# Patient Record
Sex: Male | Born: 1975
Health system: Southern US, Community
[De-identification: ages and names within clinical notes are randomized; demographics above are authoritative.]

## PROBLEM LIST (undated history)

## (undated) DIAGNOSIS — G709 Myoneural disorder, unspecified: Secondary | ICD-10-CM

## (undated) DIAGNOSIS — B019 Varicella without complication: Secondary | ICD-10-CM

## (undated) DIAGNOSIS — G4733 Obstructive sleep apnea (adult) (pediatric): Secondary | ICD-10-CM

## (undated) DIAGNOSIS — G4730 Sleep apnea, unspecified: Secondary | ICD-10-CM

## (undated) DIAGNOSIS — R51 Headache: Secondary | ICD-10-CM

## (undated) DIAGNOSIS — G8929 Other chronic pain: Secondary | ICD-10-CM

## (undated) DIAGNOSIS — J45909 Unspecified asthma, uncomplicated: Secondary | ICD-10-CM

## (undated) DIAGNOSIS — E119 Type 2 diabetes mellitus without complications: Secondary | ICD-10-CM

## (undated) DIAGNOSIS — I10 Essential (primary) hypertension: Secondary | ICD-10-CM

## (undated) DIAGNOSIS — R519 Headache, unspecified: Secondary | ICD-10-CM

## (undated) HISTORY — DX: Essential (primary) hypertension: I10

## (undated) HISTORY — DX: Unspecified asthma, uncomplicated: J45.909

## (undated) HISTORY — DX: Headache, unspecified: R51.9

## (undated) HISTORY — DX: Obstructive sleep apnea (adult) (pediatric): G47.33

## (undated) HISTORY — DX: Varicella without complication: B01.9

## (undated) HISTORY — PX: OTHER SURGICAL HISTORY: SHX169

## (undated) HISTORY — DX: Other chronic pain: G89.29

## (undated) HISTORY — DX: Headache: R51

## (undated) HISTORY — DX: Sleep apnea, unspecified: G47.30

## (undated) HISTORY — PX: NO PAST SURGERIES: SHX2092

---

## 2011-09-25 DIAGNOSIS — I10 Essential (primary) hypertension: Secondary | ICD-10-CM

## 2011-09-25 HISTORY — DX: Essential (primary) hypertension: I10

## 2012-01-03 ENCOUNTER — Emergency Department: Payer: Self-pay | Admitting: Emergency Medicine

## 2012-10-28 ENCOUNTER — Encounter: Payer: Self-pay | Admitting: Adult Health

## 2012-10-28 ENCOUNTER — Ambulatory Visit (INDEPENDENT_AMBULATORY_CARE_PROVIDER_SITE_OTHER): Payer: 59 | Admitting: Adult Health

## 2012-10-28 ENCOUNTER — Ambulatory Visit (INDEPENDENT_AMBULATORY_CARE_PROVIDER_SITE_OTHER)
Admission: RE | Admit: 2012-10-28 | Discharge: 2012-10-28 | Disposition: A | Discharge status: Home / Self Care | Payer: 59 | Source: Ambulatory Visit | Attending: Adult Health | Admitting: Adult Health

## 2012-10-28 VITALS — BP 120/78 | HR 61 | Temp 98.5°F | Resp 14 | Ht 75.0 in | Wt 277.5 lb

## 2012-10-28 DIAGNOSIS — R51 Headache: Secondary | ICD-10-CM

## 2012-10-28 DIAGNOSIS — Z5181 Encounter for therapeutic drug level monitoring: Secondary | ICD-10-CM

## 2012-10-28 DIAGNOSIS — I10 Essential (primary) hypertension: Secondary | ICD-10-CM | POA: Insufficient documentation

## 2012-10-28 DIAGNOSIS — Z9989 Dependence on other enabling machines and devices: Secondary | ICD-10-CM | POA: Insufficient documentation

## 2012-10-28 DIAGNOSIS — R52 Pain, unspecified: Secondary | ICD-10-CM

## 2012-10-28 DIAGNOSIS — Z23 Encounter for immunization: Secondary | ICD-10-CM

## 2012-10-28 DIAGNOSIS — G4730 Sleep apnea, unspecified: Secondary | ICD-10-CM

## 2012-10-28 DIAGNOSIS — Z1211 Encounter for screening for malignant neoplasm of colon: Secondary | ICD-10-CM | POA: Insufficient documentation

## 2012-10-28 DIAGNOSIS — Z0000 Encounter for general adult medical examination without abnormal findings: Secondary | ICD-10-CM

## 2012-10-28 LAB — BASIC METABOLIC PANEL
GFR: 80.64 mL/min (ref >60.00)
Glucose, Bld: 89 mg/dL (ref 70–99)
Potassium: 4.3 mEq/L (ref 3.5–5.1)
Sodium: 139 mEq/L (ref 135–145)

## 2012-10-28 NOTE — Assessment & Plan Note (Signed)
Normal physical exam except for those addressed below. Will request medical records from previous provider. Patient reports having recent labs done. Will hold off on ordering further labs until I receive medical records. Will check bmet today since patient was started on lisinopril in October, 2013.

## 2012-10-28 NOTE — Assessment & Plan Note (Signed)
Pain in bilateral heels s/p injury after having to jump off his tractor trailer. Hx of fracture to left ankle. Will send him to [facility] office for bilateral ankle/foot xray. Also pt has pain right lateral elbow. Full ROM. No locking or grinding. Suspect overuse r/t work. Inflammation. Apply ice to the area. Brace for support while working. NSAIDs for short term. If continued discomfort, discussed referral to ortho for possible joint injection.

## 2012-10-28 NOTE — Progress Notes (Signed)
Subjective:    Patient ID: Adam [name], male    DOB: 20-Feb-1976, 37 y.o.   MRN: [id number]  HPI  Adam [name] is a very pleasant 37 y/o male who presents to clinic today to establish care. He has a hx of asthma, HTN and headaches. He was previously followed at [facility]. He reports the following:  1) HTN - recently diagnosed and started on lisinopril. He has been doing well on this medication.   2) Frequent HA - he reports he usually wakes up with a HA in the occipital region. On occasion, his HA is behind bilateral eyes. Denies any n/v or other associated symptoms. He usually does not take any medication for this.  3) Left ankle fracture - hx of injury with fx to left ankle which occasionally causes discomfort. 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 He recently jumped off his truck to avoid being hit by one of the vehicles which had come loose. The results was that he landed on his heels and now is experiencing some increased discomfort. He is ambulating without any problems.  4) Right elbow locking and pain - right hand dominant. Overuse related to his work. He reports pain often wakes him up at night. Does not take any medications for this.   Health Maintenance  Influenza immunization - he does not wish to get the flu shot.  Tdap - We will administer this to him today.   Family History  Problem Relation Age of Onset  . Arthritis Mother   . Hypertension Mother   . Hypertension Father   . Diabetes Father   . Diabetes Brother   . Hypertension Brother   . Diabetes Maternal Grandmother   . Heart disease Maternal Grandmother   . Diabetes Maternal Grandfather   . Kidney disease Maternal Grandfather   . Heart disease Sister   . Diabetes Sister   . Lupus Sister   . Asthma Sister    History   Social History  . Marital Status: Married    Spouse Name: Misty Stanley    Number of Children: 3  . Years of Education: 12   Occupational History  . Psychologist, sport and exercise     Owns  [employer]   Social History Main Topics  . Smoking status: Never Smoker   . Smokeless tobacco: Never Used  . Alcohol Use: No  . Drug Use: No  . Sexually Active: Yes   Other Topics Concern  . Not on file   Social History Narrative  . No narrative on file    Review of Systems  Constitutional: Negative.   Eyes: Negative.        Recent eye exam. No vision problems.  Respiratory: Negative.        Hx of asthma as a child. None as an adult  Cardiovascular: Negative.        Hx of HTN.  Gastrointestinal: Negative.   Genitourinary: Negative.   Musculoskeletal: Negative for back pain, joint swelling and gait problem.       Left ankle occasionally hurts. Hx of fx.  Skin: Negative.   Neurological: Positive for headaches. Negative for dizziness, tremors, syncope, weakness, light-headedness and numbness.  Psychiatric/Behavioral: Negative.         Objective:   Physical Exam  Constitutional: He is oriented to person, place, and time. He appears well-developed and well-nourished. No distress.  HENT:  Head: Normocephalic and atraumatic.  Right Ear: External ear normal.  Left Ear: External ear normal.  Nose: Nose normal.  Mouth/Throat: Oropharynx is clear and moist.  Eyes: Conjunctivae normal and EOM are normal. Pupils are equal, round, and reactive to light.  Neck: Normal range of motion. Neck supple. No tracheal deviation present. No thyromegaly present.  Cardiovascular: Normal rate, regular rhythm, normal heart sounds and intact distal pulses.  Exam reveals no gallop and no friction rub.   No murmur heard. Pulmonary/Chest: Effort normal and breath sounds normal.  Abdominal: Soft. Bowel sounds are normal. He exhibits no mass. There is no tenderness.  Musculoskeletal: Normal range of motion. He exhibits tenderness. He exhibits no edema.       Tenderness bilateral heels.  Lymphadenopathy:    He has no cervical adenopathy.  Neurological: He is alert and oriented to person,  place, and time. No cranial nerve deficit. Coordination normal.  Skin: Skin is warm and dry. No rash noted. No erythema.  Psychiatric: He has a normal mood and affect. His behavior is normal. Judgment and thought content normal.          Assessment & Plan:

## 2012-10-28 NOTE — Patient Instructions (Addendum)
  Thank you for choosing  for your health care needs.  You received your Tdap vaccine today. Your next booster will be in 10 years.  Please have your lab drawn prior to leaving the office. I am checking your kidney function.  Please go to our [facility] office for an xray of both ankles. I will notify you of results once I receive them.  I have referred you to have a sleep study done to evaluate for sleep apnea.  For your elbow:   Use ice for 20 min, 3-4 times a day   Take ibuprofen 400 mg - 600 mg q6 hours for pain/inflammation   You can try an elbow brace for support. These are sold either at the pharmacy or [facility]   If your pain is not improved, I could refer you to Orthopedic for further evaluation and treatment.

## 2012-10-28 NOTE — Assessment & Plan Note (Signed)
Wife reports patient snores and occasionally she has observed him with episodes of apnea. Order for sleep study.

## 2012-10-28 NOTE — Assessment & Plan Note (Signed)
Administered today.

## 2012-10-28 NOTE — Assessment & Plan Note (Signed)
HA upon awakening most morning. Occipital. Patient's wife is present during visit and she reports patient snores. Suspect HA may be 2/2 sleep apnea. Will send for sleep study.

## 2012-10-28 NOTE — Assessment & Plan Note (Signed)
Well controlled on lisinopril. Will check kidney function today.

## 2012-11-08 ENCOUNTER — Other Ambulatory Visit: Payer: Self-pay

## 2012-12-12 ENCOUNTER — Telehealth: Payer: Self-pay | Admitting: Adult Health

## 2012-12-12 NOTE — Telephone Encounter (Signed)
[name],  Patient's wife is calling to receive his sleep study results;please advise  Adam [name]

## 2012-12-12 NOTE — Telephone Encounter (Signed)
Patient's wife calling to receive her husbands sleep study results.

## 2012-12-14 NOTE — Telephone Encounter (Signed)
  I'm pretty sure I saw these results and put them to be scanned, but I cannot find them in the patient chart. Can you help?

## 2012-12-15 NOTE — Telephone Encounter (Signed)
The sleep study showed moderate obstructive sleep apnea with recommendation for a CPAP machine. The second part of the study will test fit you with the machine and set the appropriate settings. Our Referral department is working on this to get approved through your insurance and subsequently scheduled.

## 2012-12-16 NOTE — Telephone Encounter (Signed)
Called and spoke with the patient and informed him of his sleep study evaluation results. It is recommended that he have a test fit for a machine  He is aware that our referral coordinator will be contacting him for this appointment.

## 2013-01-05 ENCOUNTER — Encounter (HOSPITAL_COMMUNITY): Payer: Self-pay | Admitting: Emergency Medicine

## 2013-01-05 ENCOUNTER — Emergency Department (HOSPITAL_COMMUNITY)
Admission: EM | Admit: 2013-01-05 | Discharge: 2013-01-05 | Disposition: A | Discharge status: Home / Self Care | Payer: 59 | Source: Home / Self Care | Attending: Emergency Medicine | Admitting: Emergency Medicine

## 2013-01-05 DIAGNOSIS — L600 Ingrowing nail: Secondary | ICD-10-CM

## 2013-01-05 MED ORDER — CEPHALEXIN 500 MG PO CAPS: 500.0000 mg | ORAL_CAPSULE | Freq: Three times a day (TID) | ORAL | Status: DC

## 2013-01-05 MED ORDER — HYDROCODONE-ACETAMINOPHEN 5-325 MG PO TABS: ORAL_TABLET | ORAL | Status: DC

## 2013-01-05 NOTE — ED Provider Notes (Signed)
Chief Complaint:   Chief Complaint  Patient presents with  . Nail Problem    x 1 month. pt states had ingrown toe nail and removed it his self and since then toe has been swollen and oozing. painful to touch and unable to wear shoes    History of Present Illness:   Adam [name] is a 37 year old male who has had an ingrown toenail involving the lateral nail fold of the right great toe for the past month. It's swollen, painful, tender to touch, as been draining pus. There's been no granulation tissue.  Review of Systems:  Other than noted above, the patient denies any of the following symptoms: Systemic:  No fevers, chills, sweats, or aches.  No fatigue or tiredness. Musculoskeletal:  No joint pain, arthritis, bursitis, swelling, back pain, or neck pain. Neurological:  No muscular weakness, paresthesias, headache, or trouble with speech or coordination.  No dizziness.  PMFSH:  Past medical history, family history, social history, meds, and allergies were reviewed.    Physical Exam:   Vital signs:  BP 147/90  Pulse 72  Temp(Src) 98.1 F (36.7 C) (Oral)  Resp 24  SpO2 99% Gen:  Alert and oriented times 3.  In no distress. Musculoskeletal: The lateral nail fold of the right great toe was swollen, tender, and hypertrophy. The nail is ingrown. There is no granulation tissue or pus underneath the nail. Both great toenails are mycotic.  Otherwise, all joints had a full a ROM with no swelling, bruising or deformity.  No edema, pulses full. Extremities were warm and pink.  Capillary refill was brisk.  Skin:  Clear, warm and dry.  No rash. Neuro:  Alert and oriented times 3.  Muscle strength was normal.  Sensation was intact to light touch.   Procedure Note:  Verbal informed consent was obtained from the patient.  Risks and benefits were outlined with the patient.  Patient understands and accepts these risks.  Identity of the patient was confirmed verbally and by armband.    Procedure was performed  as follows:  The toe was prepped with alcohol and anesthetized with a digital block with 5 mL of 2% Xylocaine without epinephrine. After satisfactory local anesthesia was achieved, the nail was elevated with nail elevator, and the nail was then split down to the base. The ingrown portion was avulsed. Bleeding was stopped with electrocautery. Antibiotic ointment and a Telfa pad were applied followed by a loose fitting Coban wrap.  Patient tolerated the procedure well without any immediate complications.  Assessment:  The encounter diagnosis was Ingrown toenail.  Plan:   1.  The following meds were prescribed:   Discharge Medication List as of 01/05/2013  6:51 PM    START taking these medications   Details  cephALEXin (KEFLEX) 500 MG capsule Take 1 capsule (500 mg total) by mouth 3 (three) times daily., Starting 01/05/2013, Until Discontinued, Normal    HYDROcodone-acetaminophen (NORCO/VICODIN) 5-325 MG per tablet 1 to 2 tabs every 4 to 6 hours as needed for pain., Print       2.  The patient was instructed in wound care. Should soak daily in warm water, apply antibiotic ointment, and a Band-Aid dressing.  Appropriate handouts were given. 3.  The patient was told to return if becoming worse in any way, if no better in 3 or 4 days, and given some red flag symptoms such as evidence of infection such as increasing swelling, pain, purulent drainage, or fever that would indicate earlier return.  4.  The patient was told to follow up here if any problems or complications.    Reuben Likes, MD 01/05/13 [phone]

## 2013-01-05 NOTE — ED Notes (Signed)
Reports ingrown toe nail a month ago that he pulled out. Since then the right great toe has become swollen and oozing puss.  Pt states that he has been using antibiotic ointment and soaking foot with no relief of symptoms.

## 2013-01-06 ENCOUNTER — Telehealth: Payer: Self-pay | Admitting: *Deleted

## 2013-01-06 NOTE — Telephone Encounter (Signed)
Called patient, was seen at Urgent Care last night, after office hours, for ingrown toenail, given antibiotic and pain medicine. Has scheduled followup appointment 01/20/2013 to get Rx for nail fungus.

## 2013-01-20 ENCOUNTER — Ambulatory Visit: Payer: 59 | Admitting: Adult Health

## 2013-03-05 ENCOUNTER — Encounter: Payer: Self-pay | Admitting: Adult Health

## 2013-07-23 ENCOUNTER — Telehealth: Payer: Self-pay | Admitting: Adult Health

## 2013-07-23 MED ORDER — LISINOPRIL 10 MG PO TABS: 10.0000 mg | ORAL_TABLET | Freq: Every day | ORAL | Status: DC

## 2013-07-23 NOTE — Telephone Encounter (Signed)
Pt spouse( LISA) called to get a refill lisinapril 10mg   90  Day supply  New pharmacy  [facility] [address]

## 2013-07-23 NOTE — Telephone Encounter (Signed)
Rx sent to pharmacy by escript for 30 days, pt due for followup

## 2013-07-30 ENCOUNTER — Other Ambulatory Visit: Payer: Self-pay

## 2013-08-11 ENCOUNTER — Telehealth: Payer: Self-pay | Admitting: Adult Health

## 2013-08-11 NOTE — Telephone Encounter (Signed)
Does he need to be seen more than yearly? Visit in 2/14 wanted followup which was not done.

## 2013-08-11 NOTE — Telephone Encounter (Signed)
The patient's wife called wanting to know why he needs to be seen before his yearly appointment . He was seen in February of this year and he is not due back. Please advise.

## 2013-08-12 MED ORDER — LISINOPRIL 10 MG PO TABS: 10.0000 mg | ORAL_TABLET | Freq: Every day | ORAL | Status: DC

## 2013-08-12 NOTE — Telephone Encounter (Signed)
Rx sent to pharmacy by escript  

## 2013-08-12 NOTE — Telephone Encounter (Signed)
Ok to wait until February

## 2013-11-10 ENCOUNTER — Encounter: Payer: 59 | Admitting: Adult Health

## 2013-11-19 ENCOUNTER — Encounter: Payer: 59 | Admitting: Adult Health

## 2013-12-11 ENCOUNTER — Ambulatory Visit (INDEPENDENT_AMBULATORY_CARE_PROVIDER_SITE_OTHER): Payer: 59 | Admitting: Adult Health

## 2013-12-11 ENCOUNTER — Encounter: Payer: Self-pay | Admitting: Adult Health

## 2013-12-11 VITALS — BP 126/82 | HR 60 | Temp 98.3°F | Resp 14 | Wt 291.0 lb

## 2013-12-11 DIAGNOSIS — Z0000 Encounter for general adult medical examination without abnormal findings: Secondary | ICD-10-CM

## 2013-12-11 DIAGNOSIS — R5383 Other fatigue: Secondary | ICD-10-CM

## 2013-12-11 DIAGNOSIS — I10 Essential (primary) hypertension: Secondary | ICD-10-CM

## 2013-12-11 DIAGNOSIS — R5381 Other malaise: Secondary | ICD-10-CM

## 2013-12-11 MED ORDER — LISINOPRIL 10 MG PO TABS: 10.0000 mg | ORAL_TABLET | Freq: Every day | ORAL | Status: DC

## 2013-12-11 NOTE — Progress Notes (Signed)
Patient ID: Adam [name], male   DOB: 1975-11-22, 38 y.o.   MRN: [id number]    Subjective:    Patient ID: Adam [name], male    DOB: 18-Apr-1976, 38 y.o.   MRN: [id number]  HPI  Pt is a pleasant 38 y/o male who presents to clinic for his yearly physical exam including lab work. He is not fasting today. He reports doing well. Has been exercising. He has been taking his blood pressure medication as prescribed and reports improvement. He is also using his CPAP nightly. Feeling improved since using the CPAP.   Past Medical History  Diagnosis Date  . Asthma     Childhood  . Hypertension 2013  . Chicken pox   . Chronic headaches   . Sleep apnea     on CPAP    No past surgical history on file.   Family History  Problem Relation Age of Onset  . Arthritis Mother   . Hypertension Mother   . Hypertension Father   . Diabetes Father   . Diabetes Brother   . Hypertension Brother   . Diabetes Maternal Grandmother   . Heart disease Maternal Grandmother   . Diabetes Maternal Grandfather   . Kidney disease Maternal Grandfather   . Heart disease Sister   . Diabetes Sister   . Lupus Sister   . Asthma Sister      History   Social History  . Marital Status: Married    Spouse Name: Lattie Haw    Number of Children: 3  . Years of Education: 12   Occupational History  . [employer]   Social History Main Topics  . Smoking status: Never Smoker   . Smokeless tobacco: Never Used  . Alcohol Use: No  . Drug Use: No  . Sexual Activity: Yes   Other Topics Concern  . Not on file   Social History Narrative  . No narrative on file     Current Outpatient Prescriptions on File Prior to Visit  Medication Sig Dispense Refill  . lisinopril (PRINIVIL,ZESTRIL) 10 MG tablet Take 1 tablet (10 mg total) by mouth daily.  90 tablet  0   No current facility-administered medications on file prior to visit.     Review of Systems  Constitutional: Negative.   HENT:  Negative.   Eyes: Negative.   Respiratory: Negative.   Cardiovascular: Negative.   Gastrointestinal: Negative.   Endocrine: Negative.   Genitourinary: Negative.   Musculoskeletal: Negative.   Skin: Negative.   Allergic/Immunologic: Negative.   Neurological: Negative.   Hematological: Negative.   Psychiatric/Behavioral: Negative.        Objective:  BP 126/82  Pulse 60  Temp(Src) 98.3 F (36.8 C) (Oral)  Resp 14  Wt 291 lb (131.997 kg)  SpO2 97%   Physical Exam  Constitutional: He is oriented to person, place, and time. He appears well-developed and well-nourished. No distress.  HENT:  Head: Normocephalic and atraumatic.  Right Ear: External ear normal.  Left Ear: External ear normal.  Nose: Nose normal.  Mouth/Throat: Oropharynx is clear and moist.  Eyes: Conjunctivae and EOM are normal. Pupils are equal, round, and reactive to light.  Neck: Normal range of motion. Neck supple. No tracheal deviation present. No thyromegaly present.  Cardiovascular: Normal rate, regular rhythm, normal heart sounds and intact distal pulses.  Exam reveals no gallop and no friction rub.   No murmur heard. Pulmonary/Chest: Effort normal and breath sounds normal. No respiratory  distress. He has no wheezes. He has no rales.  Abdominal: Soft. Bowel sounds are normal. He exhibits no distension and no mass. There is no tenderness. There is no rebound and no guarding.  Musculoskeletal: Normal range of motion. He exhibits no edema and no tenderness.  Lymphadenopathy:    He has no cervical adenopathy.  Neurological: He is alert and oriented to person, place, and time. He has normal reflexes. No cranial nerve deficit. Coordination normal.  Skin: Skin is warm and dry.  Psychiatric: He has a normal mood and affect. His behavior is normal. Judgment and thought content normal.       Assessment & Plan:   1. Routine general medical examination at a health care facility Normal physical exam. Screenings  addressed.   - Lipid panel; Future  2. HTN (hypertension) Well controlled on lisinopril. Check labs. Refills sent. Continue to follow.  - Basic metabolic panel; Future  3. Fatigue Check labs. May be related to working at night. - CBC with Differential; Future - TSH; Future

## 2013-12-11 NOTE — Progress Notes (Signed)
Pre visit review using our clinic review tool, if applicable. No additional management support is needed unless otherwise documented below in the visit note. 

## 2013-12-11 NOTE — Patient Instructions (Signed)
  Your blood pressure is very well controlled.  Continue to exercise.  I have sent in a prescription to your pharmacy for your lisinopril.  Please return for your fasting labs.  I will see you back in one year or sooner if necessary.

## 2013-12-14 ENCOUNTER — Telehealth: Payer: Self-pay | Admitting: Adult Health

## 2013-12-14 ENCOUNTER — Other Ambulatory Visit (INDEPENDENT_AMBULATORY_CARE_PROVIDER_SITE_OTHER): Payer: 59

## 2013-12-14 DIAGNOSIS — R5381 Other malaise: Secondary | ICD-10-CM

## 2013-12-14 DIAGNOSIS — Z0000 Encounter for general adult medical examination without abnormal findings: Secondary | ICD-10-CM

## 2013-12-14 DIAGNOSIS — I10 Essential (primary) hypertension: Secondary | ICD-10-CM

## 2013-12-14 DIAGNOSIS — R5383 Other fatigue: Secondary | ICD-10-CM

## 2013-12-14 LAB — CBC WITH DIFFERENTIAL/PLATELET
Basophils Absolute: 0 10*3/uL (ref 0.0–0.1)
Basophils Relative: 0.2 % (ref 0.0–3.0)
EOS PCT: 4.1 % (ref 0.0–5.0)
Eosinophils Absolute: 0.2 10*3/uL (ref 0.0–0.7)
HEMATOCRIT: 49.7 % (ref 39.0–52.0)
Hemoglobin: 16.4 g/dL (ref 13.0–17.0)
LYMPHS ABS: 2.1 10*3/uL (ref 0.7–4.0)
Lymphocytes Relative: 35.2 % (ref 12.0–46.0)
MCHC: 33 g/dL (ref 30.0–36.0)
MCV: 91.2 fl (ref 78.0–100.0)
MONO ABS: 0.5 10*3/uL (ref 0.1–1.0)
Monocytes Relative: 7.8 % (ref 3.0–12.0)
Neutro Abs: 3.1 10*3/uL (ref 1.4–7.7)
Neutrophils Relative %: 52.7 % (ref 43.0–77.0)
PLATELETS: 251 10*3/uL (ref 150.0–400.0)
RBC: 5.45 Mil/uL (ref 4.22–5.81)
RDW: 12.6 % (ref 11.5–14.6)
WBC: 6 10*3/uL (ref 4.5–10.5)

## 2013-12-14 LAB — BASIC METABOLIC PANEL
BUN: 16 mg/dL (ref 6–23)
CHLORIDE: 103 meq/L (ref 96–112)
CO2: 30 meq/L (ref 19–32)
Calcium: 9.6 mg/dL (ref 8.4–10.5)
Creatinine, Ser: 1.3 mg/dL (ref 0.4–1.5)
GFR: 81.61 mL/min (ref >60.00)
GLUCOSE: 88 mg/dL (ref 70–99)
POTASSIUM: 4.1 meq/L (ref 3.5–5.1)
Sodium: 138 mEq/L (ref 135–145)

## 2013-12-14 LAB — LIPID PANEL
CHOLESTEROL: 145 mg/dL (ref 0–200)
HDL: 42 mg/dL (ref >39.00)
LDL CALC: 88 mg/dL (ref 0–99)
Total CHOL/HDL Ratio: 3
Triglycerides: 73 mg/dL (ref 0.0–149.0)
VLDL: 14.6 mg/dL (ref 0.0–40.0)

## 2013-12-14 LAB — TSH: TSH: 0.59 u[IU]/mL (ref 0.35–5.50)

## 2013-12-14 NOTE — Telephone Encounter (Signed)
Relevant patient education assigned to patient using Emmi. ° °

## 2013-12-15 ENCOUNTER — Encounter: Payer: Self-pay | Admitting: Adult Health

## 2013-12-17 NOTE — Telephone Encounter (Signed)
Unread mychart message mailed

## 2013-12-30 IMAGING — CR DG ANKLE COMPLETE 3+V*L*
3 series · 3 of 3 positions shown · non-contrast
Comparison: None.

CLINICAL DATA: Pain post trauma

LEFT ANKLE COMPLETE - 3+ VIEW

[view not recorded (1 of 3)]
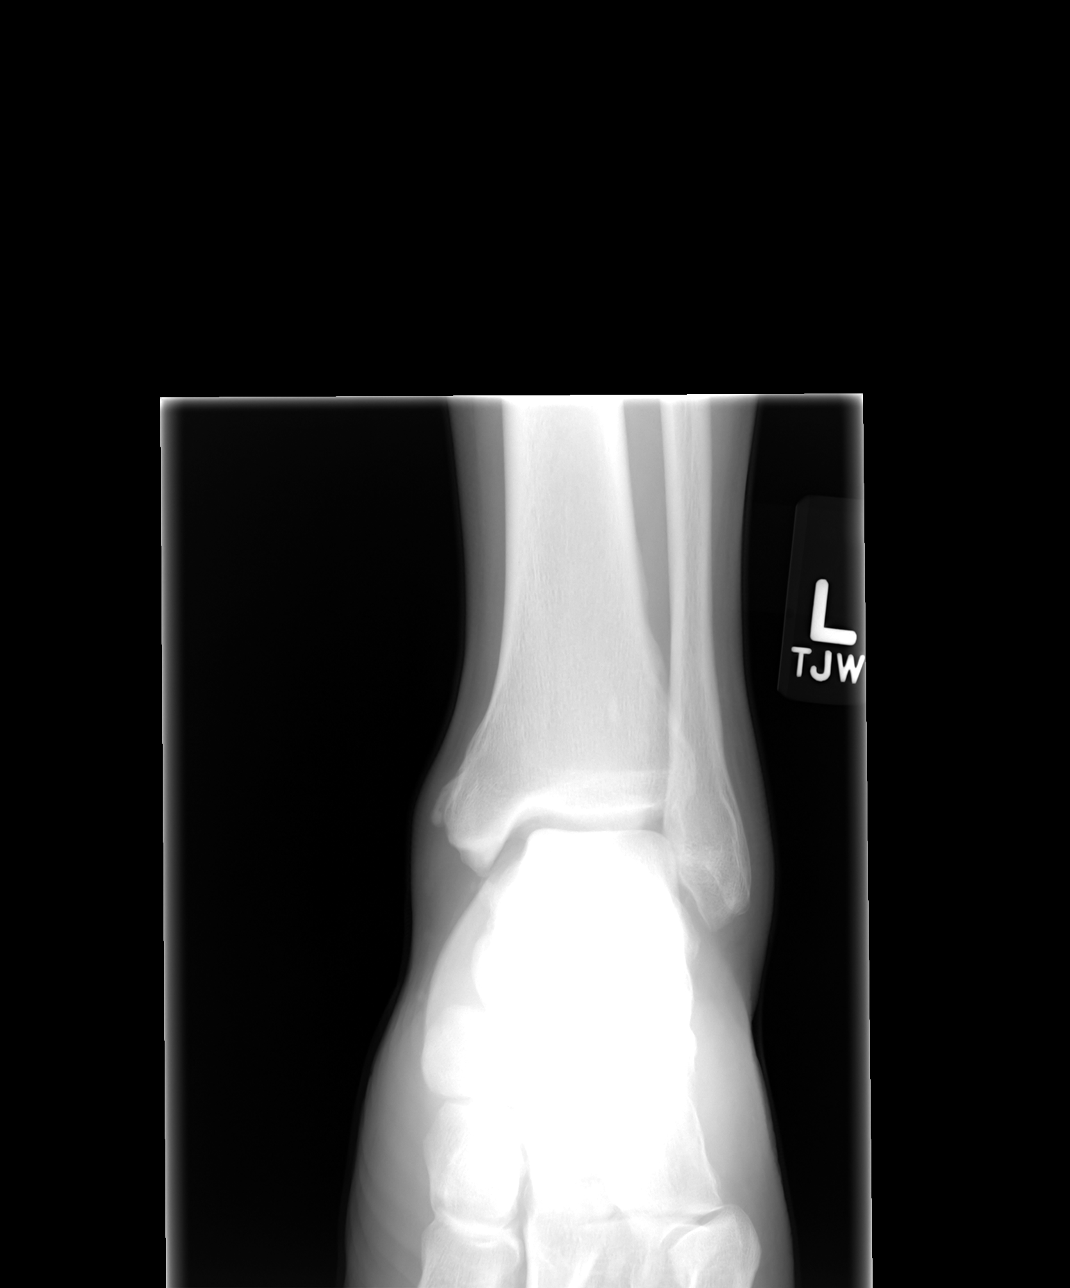

[view not recorded (2 of 3)]
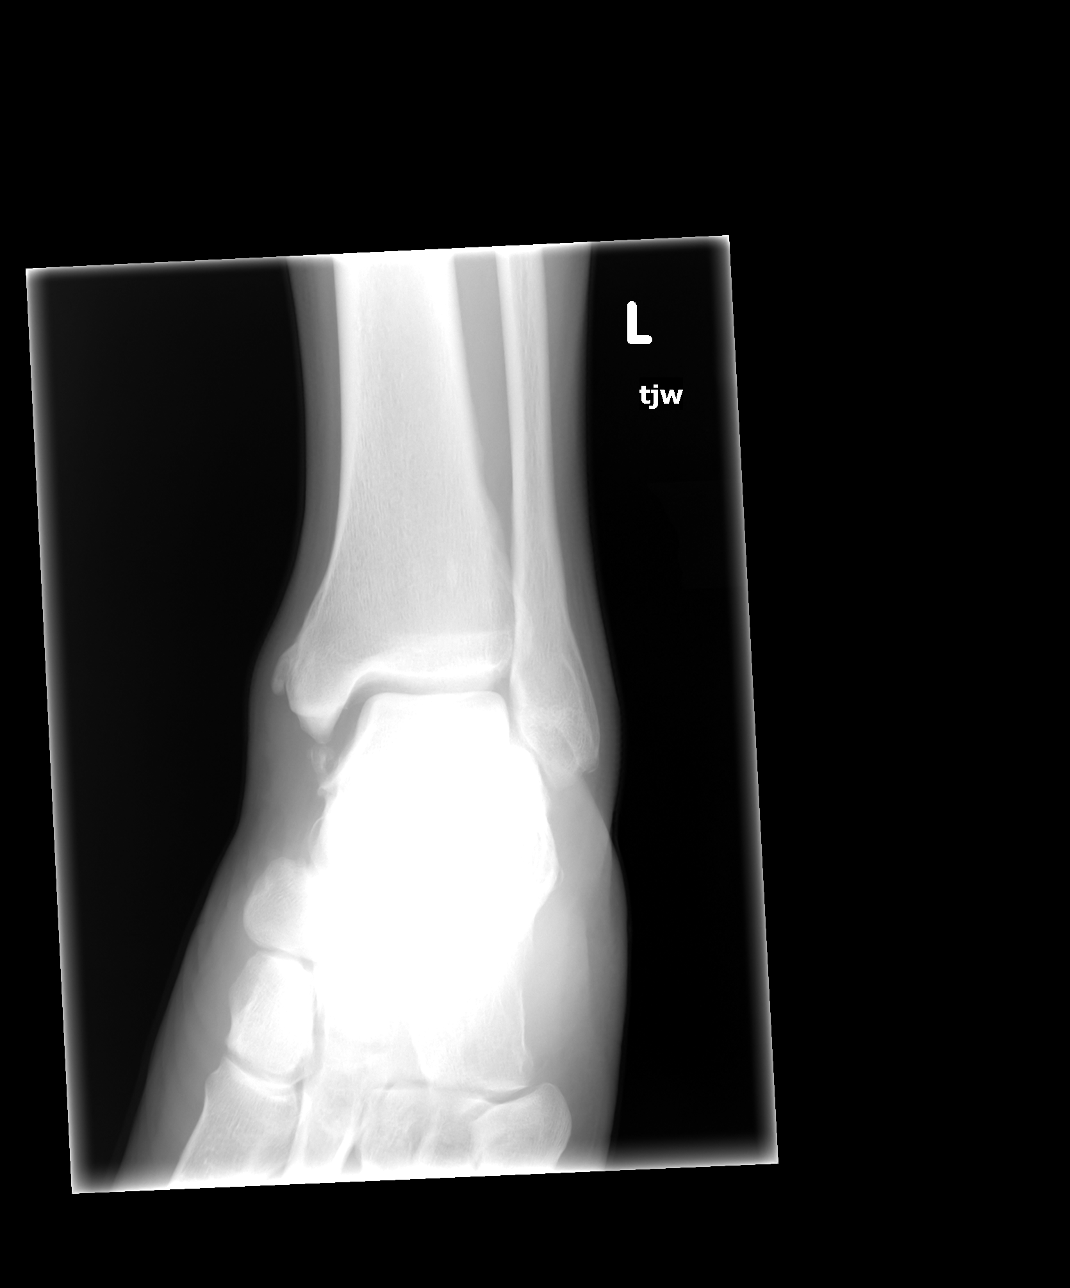

[view not recorded (3 of 3)]
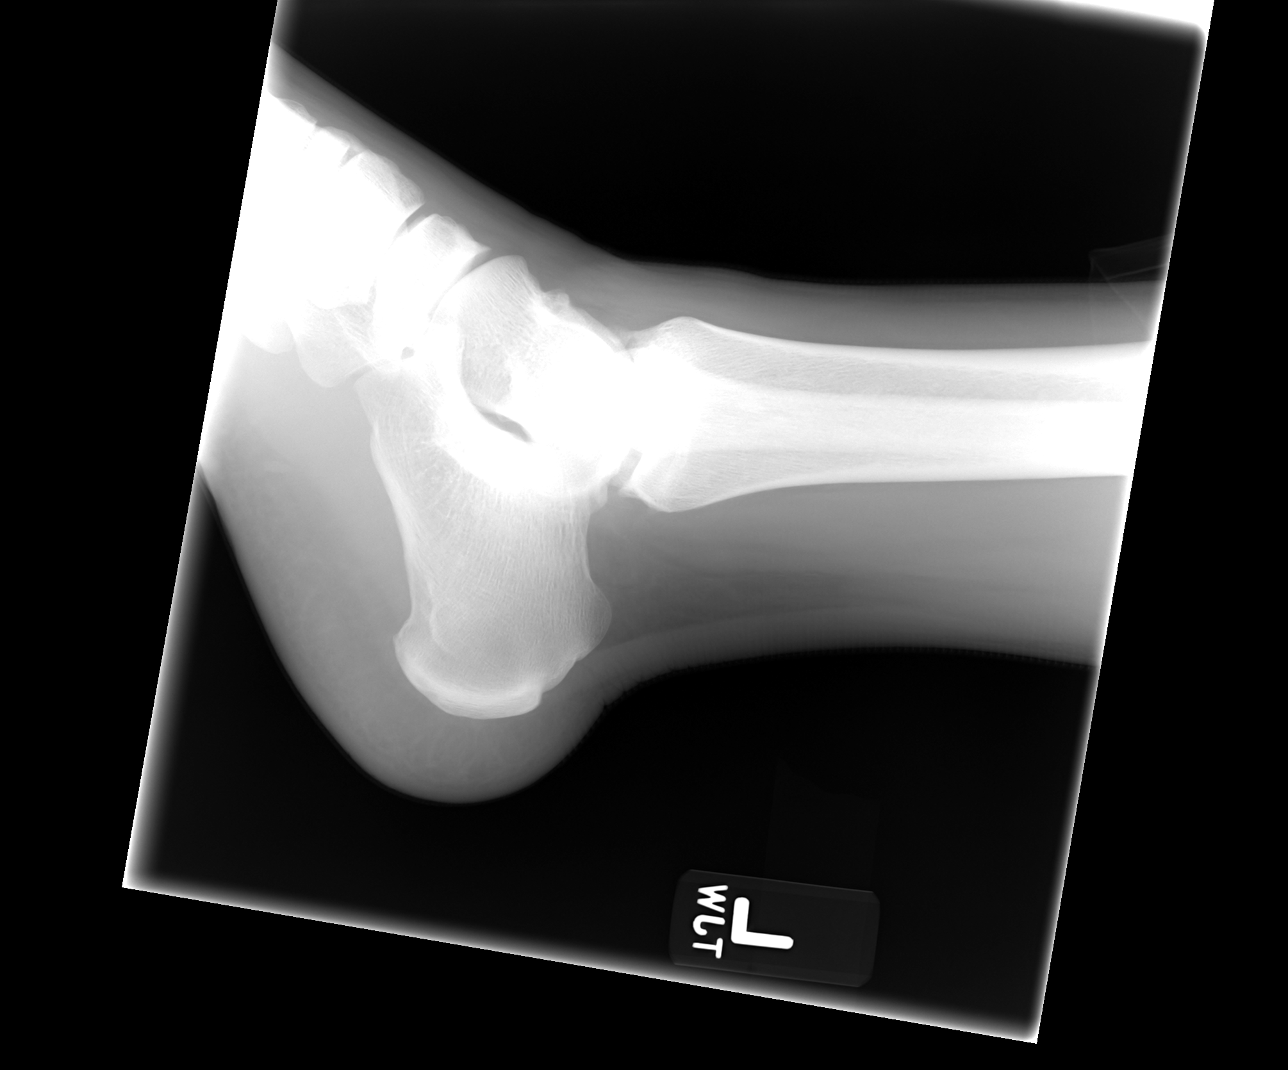

[3 of 3 positions shown; findings below may reference images not displayed]

FINDINGS: Frontal, oblique, and lateral views were obtained. There
is no acute fracture or effusion.  There is evidence suggesting old
trauma with calcification in the medial malleolus.  There is a
benign-appearing exostosis arising from the medial malleolus as
well.  There is no erosive change. Ankle mortise appears intact.
IMPRESSION: Evidence of old trauma medially.  No acute fracture.  Mortise
intact.

## 2014-07-23 ENCOUNTER — Emergency Department (HOSPITAL_COMMUNITY)
Admission: EM | Admit: 2014-07-23 | Discharge: 2014-07-23 | Disposition: A | Discharge status: Home / Self Care | Payer: 59 | Source: Home / Self Care | Attending: Family Medicine | Admitting: Family Medicine

## 2014-07-23 ENCOUNTER — Ambulatory Visit (HOSPITAL_COMMUNITY)
Admit: 2014-07-23 | Discharge: 2014-07-23 | Disposition: A | Discharge status: Home / Self Care | Payer: 59 | Source: Ambulatory Visit | Attending: Family Medicine | Admitting: Family Medicine

## 2014-07-23 ENCOUNTER — Encounter (HOSPITAL_COMMUNITY): Payer: Self-pay | Admitting: Emergency Medicine

## 2014-07-23 DIAGNOSIS — R0789 Other chest pain: Secondary | ICD-10-CM | POA: Diagnosis not present

## 2014-07-23 DIAGNOSIS — J45909 Unspecified asthma, uncomplicated: Secondary | ICD-10-CM | POA: Insufficient documentation

## 2014-07-23 DIAGNOSIS — J069 Acute upper respiratory infection, unspecified: Secondary | ICD-10-CM

## 2014-07-23 DIAGNOSIS — R0602 Shortness of breath: Secondary | ICD-10-CM

## 2014-07-23 DIAGNOSIS — J4521 Mild intermittent asthma with (acute) exacerbation: Secondary | ICD-10-CM

## 2014-07-23 MED ORDER — ALBUTEROL SULFATE (2.5 MG/3ML) 0.083% IN NEBU
INHALATION_SOLUTION | RESPIRATORY_TRACT | Status: AC
  Filled 2014-07-23: qty 6

## 2014-07-23 MED ORDER — IPRATROPIUM BROMIDE 0.02 % IN SOLN
0.5000 mg | Freq: Once | RESPIRATORY_TRACT | Status: AC
  Administered 2014-07-23: 0.5 mg via RESPIRATORY_TRACT

## 2014-07-23 MED ORDER — IPRATROPIUM BROMIDE 0.02 % IN SOLN
RESPIRATORY_TRACT | Status: AC
  Filled 2014-07-23: qty 2.5

## 2014-07-23 MED ORDER — PREDNISONE 20 MG PO TABS
ORAL_TABLET | ORAL | Status: AC
  Filled 2014-07-23: qty 3

## 2014-07-23 MED ORDER — ALBUTEROL SULFATE HFA 108 (90 BASE) MCG/ACT IN AERS: 2.0000 | INHALATION_SPRAY | RESPIRATORY_TRACT | Status: DC | PRN

## 2014-07-23 MED ORDER — ALBUTEROL SULFATE (2.5 MG/3ML) 0.083% IN NEBU
5.0000 mg | INHALATION_SOLUTION | Freq: Once | RESPIRATORY_TRACT | Status: AC
  Administered 2014-07-23: 5 mg via RESPIRATORY_TRACT

## 2014-07-23 MED ORDER — PREDNISONE 50 MG PO TABS: 50.0000 mg | ORAL_TABLET | Freq: Every day | ORAL | Status: DC

## 2014-07-23 MED ORDER — PREDNISONE 20 MG PO TABS
60.0000 mg | ORAL_TABLET | Freq: Once | ORAL | Status: AC
  Administered 2014-07-23: 60 mg via ORAL

## 2014-07-23 MED ORDER — ALBUTEROL SULFATE (2.5 MG/3ML) 0.083% IN NEBU
INHALATION_SOLUTION | RESPIRATORY_TRACT | Status: AC
  Filled 2014-07-23: qty 3

## 2014-07-23 NOTE — ED Provider Notes (Signed)
Medical screening examination/treatment/procedure(s) were performed by resident physician or non-physician practitioner and as supervising physician I was immediately available for consultation/collaboration.   Pauline Good MD.   Billy Fischer, MD 07/23/14 [phone]

## 2014-07-23 NOTE — ED Notes (Signed)
Pt  Has  Symptoms of  Wheezing  Cough  And  Congestion  For    Several days           History of   Asthma    And htn

## 2014-07-23 NOTE — Discharge Instructions (Signed)
Bronchospasm °A bronchospasm is a spasm or tightening of the airways going into the lungs. During a bronchospasm breathing becomes more difficult because the airways get smaller. When this happens there can be coughing, a whistling sound when breathing (wheezing), and difficulty breathing. Bronchospasm is often associated with asthma, but not all patients who experience a bronchospasm have asthma. °CAUSES  °A bronchospasm is caused by inflammation or irritation of the airways. The inflammation or irritation may be triggered by:  °· Allergies (such as to animals, pollen, food, or mold). Allergens that cause bronchospasm may cause wheezing immediately after exposure or many hours later.   °· Infection. Viral infections are believed to be the most common cause of bronchospasm.   °· Exercise.   °· Irritants (such as pollution, cigarette smoke, strong odors, aerosol sprays, and paint fumes).   °· Weather changes. Winds increase molds and pollens in the air. Rain refreshes the air by washing irritants out. Cold air may cause inflammation.   °· Stress and emotional upset.   °SIGNS AND SYMPTOMS  °· Wheezing.   °· Excessive nighttime coughing.   °· Frequent or severe coughing with a simple cold.   °· Chest tightness.   °· Shortness of breath.   °DIAGNOSIS  °Bronchospasm is usually diagnosed through a history and physical exam. Tests, such as chest X-rays, are sometimes done to look for other conditions. °TREATMENT  °· Inhaled medicines can be given to open up your airways and help you breathe. The medicines can be given using either an inhaler or a nebulizer machine. °· Corticosteroid medicines may be given for severe bronchospasm, usually when it is associated with asthma. °HOME CARE INSTRUCTIONS  °· Always have a plan prepared for seeking medical care. Know when to call your health care provider and local emergency services (911 in the [place] Know where you can access local emergency care. °· Only take medicines as  directed by your health care provider. °· If you were prescribed an inhaler or nebulizer machine, ask your health care provider to explain how to use it correctly. Always use a spacer with your inhaler if you were given one. °· It is necessary to remain calm during an attack. Try to relax and breathe more slowly.  °· Control your home environment in the following ways:   °¨ Change your heating and air conditioning filter at least once a month.   °¨ Limit your use of fireplaces and wood stoves. °¨ Do not smoke and do not allow smoking in your home.   °¨ Avoid exposure to perfumes and fragrances.   °¨ Get rid of pests (such as roaches and mice) and their droppings.   °¨ Throw away plants if you see mold on them.   °¨ Keep your house clean and dust free.   °¨ Replace carpet with wood, tile, or vinyl flooring. Carpet can trap dander and dust.   °¨ Use allergy-proof pillows, mattress covers, and box spring covers.   °¨ Wash bed sheets and blankets every week in hot water and dry them in a dryer.   °¨ Use blankets that are made of polyester or cotton.   °¨ Wash hands frequently. °SEEK MEDICAL CARE IF:  °· You have muscle aches.   °· You have chest pain.   °· The sputum changes from clear or white to yellow, green, gray, or bloody.   °· The sputum you cough up gets thicker.   °· There are problems that may be related to the medicine you are given, such as a rash, itching, swelling, or trouble breathing.   °SEEK IMMEDIATE MEDICAL CARE IF:  °· You have worsening wheezing and coughing even   after taking your prescribed medicines.   °· You have increased difficulty breathing.   °· You develop severe chest pain. °MAKE SURE YOU:  °· Understand these instructions. °· Will watch your condition. °· Will get help right away if you are not doing well or get worse. °Document Released: 09/13/2003 Document Revised: 09/15/2013 Document Reviewed: 03/02/2013 °[employer]® Patient Information ©2015 [employer]. This information is not  intended to replace advice given to you by your health care provider. Make sure you discuss any questions you have with your health care provider. ° °

## 2014-07-23 NOTE — ED Provider Notes (Signed)
CSN: [id number]     Arrival date & time 07/23/14  4098 History   First MD Initiated Contact with Patient 07/23/14 1000     Chief Complaint  Patient presents with  . Wheezing   (Consider location/radiation/quality/duration/timing/severity/associated sxs/prior Treatment) HPI         38 year old male presents complaining of asthma exacerbation. His daughter has been at home fighting a cold. He woke up yesterday morning with wheezing and right ear pain. He also has chest pain with coughing, and chest pain with breathing. He assumed he just has a cold but he continues to get worse. He tried taking an over-the-counter asthma medicine but that did not help. He had a fever initially but no more fever. No nausea or vomiting. He had asthma as a child but has never had asthma as an adult. No leg pain or swelling. No recent travel. No history of DVT or PE.  Past Medical History  Diagnosis Date  . Asthma     Childhood  . Hypertension 2013  . Chicken pox   . Chronic headaches   . Sleep apnea     on CPAP   History reviewed. No pertinent past surgical history. Family History  Problem Relation Age of Onset  . Arthritis Mother   . Hypertension Mother   . Hypertension Father   . Diabetes Father   . Diabetes Brother   . Hypertension Brother   . Diabetes Maternal Grandmother   . Heart disease Maternal Grandmother   . Diabetes Maternal Grandfather   . Kidney disease Maternal Grandfather   . Heart disease Sister   . Diabetes Sister   . Lupus Sister   . Asthma Sister    History  Substance Use Topics  . Smoking status: Never Smoker   . Smokeless tobacco: Never Used  . Alcohol Use: No    Review of Systems  Constitutional: Positive for fever and chills.  HENT: Positive for congestion, ear pain and sore throat. Negative for sinus pressure.   Respiratory: Positive for cough, chest tightness, shortness of breath and wheezing.   Cardiovascular: Positive for chest pain (with coughing and  breathing).  Gastrointestinal: Negative for nausea, vomiting, abdominal pain and diarrhea.  All other systems reviewed and are negative.   Allergies  Review of patient's allergies indicates no active allergies.  Home Medications   Prior to Admission medications   Medication Sig Start Date End Date Taking? Authorizing Provider  albuterol (PROVENTIL HFA;VENTOLIN HFA) 108 (90 BASE) MCG/ACT inhaler Inhale 2 puffs into the lungs every 4 (four) hours as needed for wheezing. 07/23/14   Freeman Caldron Zoeya Gramajo, PA-C  lisinopril (PRINIVIL,ZESTRIL) 10 MG tablet Take 1 tablet (10 mg total) by mouth daily. 12/11/13   [name] Dagoberto Ligas, NP  predniSONE (DELTASONE) 50 MG tablet Take 1 tablet (50 mg total) by mouth daily with breakfast. 07/23/14   Liam Graham, PA-C   BP 170/110  Pulse 90  Temp(Src) 98.6 F (37 C) (Oral)  Resp 24  SpO2 95% Physical Exam  Nursing note and vitals reviewed. Constitutional: He is oriented to person, place, and time. He appears well-developed and well-nourished. No distress.  HENT:  Head: Normocephalic and atraumatic.  Right Ear: Tympanic membrane, external ear and ear canal normal.  Left Ear: Tympanic membrane, external ear and ear canal normal.  Nose: Nose normal. Right sinus exhibits no maxillary sinus tenderness and no frontal sinus tenderness. Left sinus exhibits no maxillary sinus tenderness and no frontal sinus tenderness.  Mouth/Throat: Uvula is midline  and mucous membranes are normal. Posterior oropharyngeal erythema (Mild, without exudate) present. No oropharyngeal exudate.  Eyes: Conjunctivae are normal. Right eye exhibits no discharge. Left eye exhibits no discharge.  Neck: Normal range of motion. Neck supple.  Cardiovascular: Normal rate, regular rhythm and normal heart sounds.   Pulmonary/Chest: Tachypnea noted. No respiratory distress. He has wheezes (Diffuse, expiratory, with prolonged expiratory phase). He has no rales. He exhibits no tenderness.   Lymphadenopathy:    He has cervical adenopathy (Tonsillar).  Neurological: He is alert and oriented to person, place, and time. Coordination normal.  Skin: Skin is warm and dry. No rash noted. He is not diaphoretic.  Psychiatric: He has a normal mood and affect. Judgment normal.    ED Course  Procedures (including critical care time) Labs Review Labs Reviewed - No data to display  Imaging Review Dg Chest 2 View  07/23/2014   CLINICAL DATA:  Asthma attack for 2 days with chest tightness  EXAM: CHEST  2 VIEW  COMPARISON:  None.  FINDINGS: Mild central peribronchial thickening. Heart and mediastinal contours are within normal limits. No focal opacities or effusions. No acute bony abnormality.  IMPRESSION: Mild bronchitic changes.   Electronically Signed   By: Rolm Baptise M.D.   On: 07/23/2014 11:26     MDM   1. RAD (reactive airway disease), mild intermittent, with acute exacerbation   2. SOB (shortness of breath)   3. Viral URI    Resolution of wheezing with 2 breathing treatments and 60 mg of oral prednisone. Chest x-ray does not show any consolidation or pneumonia. Treated with prednisone and albuterol. Follow-up when necessary  Meds ordered this encounter  Medications  . albuterol (PROVENTIL) (2.5 MG/3ML) 0.083% nebulizer solution 5 mg    Sig:   . ipratropium (ATROVENT) nebulizer solution 0.5 mg    Sig:   . predniSONE (DELTASONE) tablet 60 mg    Sig:   . albuterol (PROVENTIL) (2.5 MG/3ML) 0.083% nebulizer solution 5 mg    Sig:   . ipratropium (ATROVENT) nebulizer solution 0.5 mg    Sig:   . predniSONE (DELTASONE) 50 MG tablet    Sig: Take 1 tablet (50 mg total) by mouth daily with breakfast.    Dispense:  4 tablet    Refill:  0    Order Specific Question:  Supervising Provider    Answer:  Billy Fischer [phone]  . albuterol (PROVENTIL HFA;VENTOLIN HFA) 108 (90 BASE) MCG/ACT inhaler    Sig: Inhale 2 puffs into the lungs every 4 (four) hours as needed for wheezing.     Dispense:  1 Inhaler    Refill:  0    Order Specific Question:  Supervising Provider    Answer:  Ihor Gully D [place], PA-C 07/23/14 1137

## 2014-12-13 ENCOUNTER — Other Ambulatory Visit: Payer: Self-pay | Admitting: *Deleted

## 2014-12-13 MED ORDER — LISINOPRIL 10 MG PO TABS: 10.0000 mg | ORAL_TABLET | Freq: Every day | ORAL | Status: DC

## 2014-12-13 NOTE — Telephone Encounter (Signed)
Appt 01/14/15

## 2015-01-04 ENCOUNTER — Telehealth: Payer: Self-pay | Admitting: *Deleted

## 2015-01-04 MED ORDER — LISINOPRIL 10 MG PO TABS: 10.0000 mg | ORAL_TABLET | Freq: Every day | ORAL | Status: DC

## 2015-01-04 NOTE — Telephone Encounter (Signed)
Phone call from pt's wife, he lost his Lisinopril bottle and needs a refill until his appointment 01/14/15. Rx sent to pharmacy by escript

## 2015-01-14 ENCOUNTER — Ambulatory Visit (INDEPENDENT_AMBULATORY_CARE_PROVIDER_SITE_OTHER): Payer: 59 | Admitting: Internal Medicine

## 2015-01-14 ENCOUNTER — Encounter: Payer: Self-pay | Admitting: Internal Medicine

## 2015-01-14 VITALS — BP 132/100 | HR 72 | Temp 98.4°F | Resp 16 | Ht 74.75 in | Wt 303.0 lb

## 2015-01-14 DIAGNOSIS — Z1159 Encounter for screening for other viral diseases: Secondary | ICD-10-CM

## 2015-01-14 DIAGNOSIS — E669 Obesity, unspecified: Secondary | ICD-10-CM

## 2015-01-14 DIAGNOSIS — N508 Other specified disorders of male genital organs: Secondary | ICD-10-CM | POA: Diagnosis not present

## 2015-01-14 DIAGNOSIS — R5383 Other fatigue: Secondary | ICD-10-CM

## 2015-01-14 DIAGNOSIS — E559 Vitamin D deficiency, unspecified: Secondary | ICD-10-CM | POA: Diagnosis not present

## 2015-01-14 DIAGNOSIS — G4730 Sleep apnea, unspecified: Secondary | ICD-10-CM | POA: Diagnosis not present

## 2015-01-14 DIAGNOSIS — G4733 Obstructive sleep apnea (adult) (pediatric): Secondary | ICD-10-CM

## 2015-01-14 DIAGNOSIS — Z0000 Encounter for general adult medical examination without abnormal findings: Secondary | ICD-10-CM

## 2015-01-14 DIAGNOSIS — I10 Essential (primary) hypertension: Secondary | ICD-10-CM

## 2015-01-14 DIAGNOSIS — Q828 Other specified congenital malformations of skin: Secondary | ICD-10-CM | POA: Diagnosis not present

## 2015-01-14 DIAGNOSIS — Z9989 Dependence on other enabling machines and devices: Secondary | ICD-10-CM

## 2015-01-14 DIAGNOSIS — N5089 Other specified disorders of the male genital organs: Secondary | ICD-10-CM

## 2015-01-14 LAB — COMPREHENSIVE METABOLIC PANEL
ALT: 75 U/L — AB (ref 0–53)
AST: 35 U/L (ref 0–37)
Albumin: 4.1 g/dL (ref 3.5–5.2)
Alkaline Phosphatase: 68 U/L (ref 39–117)
BILIRUBIN TOTAL: 0.8 mg/dL (ref 0.2–1.2)
BUN: 13 mg/dL (ref 6–23)
CO2: 34 mEq/L — ABNORMAL HIGH (ref 19–32)
CREATININE: 1.22 mg/dL (ref 0.40–1.50)
Calcium: 9.5 mg/dL (ref 8.4–10.5)
Chloride: 100 mEq/L (ref 96–112)
GFR: 85 mL/min (ref >60.00)
Glucose, Bld: 85 mg/dL (ref 70–99)
Potassium: 4.2 mEq/L (ref 3.5–5.1)
SODIUM: 138 meq/L (ref 135–145)
Total Protein: 7.1 g/dL (ref 6.0–8.3)

## 2015-01-14 LAB — CBC WITH DIFFERENTIAL/PLATELET
BASOS PCT: 0.4 % (ref 0.0–3.0)
Basophils Absolute: 0 10*3/uL (ref 0.0–0.1)
Eosinophils Absolute: 0.2 10*3/uL (ref 0.0–0.7)
Eosinophils Relative: 3.2 % (ref 0.0–5.0)
HEMATOCRIT: 50.9 % (ref 39.0–52.0)
HEMOGLOBIN: 17 g/dL (ref 13.0–17.0)
Lymphocytes Relative: 34.7 % (ref 12.0–46.0)
Lymphs Abs: 2.6 10*3/uL (ref 0.7–4.0)
MCHC: 33.5 g/dL (ref 30.0–36.0)
MCV: 89.4 fl (ref 78.0–100.0)
Monocytes Absolute: 0.8 10*3/uL (ref 0.1–1.0)
Monocytes Relative: 10.1 % (ref 3.0–12.0)
NEUTROS ABS: 3.9 10*3/uL (ref 1.4–7.7)
Neutrophils Relative %: 51.6 % (ref 43.0–77.0)
Platelets: 240 10*3/uL (ref 150.0–400.0)
RBC: 5.69 Mil/uL (ref 4.22–5.81)
RDW: 12.9 % (ref 11.5–15.5)
WBC: 7.6 10*3/uL (ref 4.0–10.5)

## 2015-01-14 LAB — MICROALBUMIN / CREATININE URINE RATIO
CREATININE, U: 560 mg/dL
MICROALB UR: 11.9 mg/dL — AB (ref 0.0–1.9)
Microalb Creat Ratio: 2.1 mg/g (ref 0.0–30.0)

## 2015-01-14 LAB — LIPID PANEL
CHOL/HDL RATIO: 3
CHOLESTEROL: 152 mg/dL (ref 0–200)
HDL: 47.3 mg/dL (ref >39.00)
LDL Cholesterol: 78 mg/dL (ref 0–99)
NonHDL: 104.7
TRIGLYCERIDES: 136 mg/dL (ref 0.0–149.0)
VLDL: 27.2 mg/dL (ref 0.0–40.0)

## 2015-01-14 LAB — VITAMIN D 25 HYDROXY (VIT D DEFICIENCY, FRACTURES): VITD: 15.39 ng/mL — AB (ref 30.00–100.00)

## 2015-01-14 LAB — TSH: TSH: 0.67 u[IU]/mL (ref 0.35–4.50)

## 2015-01-14 MED ORDER — LISINOPRIL 10 MG PO TABS: 10.0000 mg | ORAL_TABLET | Freq: Every day | ORAL | Status: DC

## 2015-01-14 MED ORDER — TRAMADOL HCL 50 MG PO TABS: 50.0000 mg | ORAL_TABLET | Freq: Four times a day (QID) | ORAL | Status: DC | PRN

## 2015-01-14 NOTE — Progress Notes (Signed)
Patient ID: Adam [name], male   DOB: 25-May-1976, 39 y.o.   MRN: [id number]  The patient is here for his annual physical examination and management of other chronic and acute problems.  A  CC:   WT GAIN,  BP UP,  FEELS 'FAT"  haa been drinking apple cider vinegar as a wt loss remedy,, caused gastritis but  lsot wt.  Overeats regularly due ot increased appetite.  ot exercissingn due to joint pain,  Has constant pain right hip ,  Post surgery, ,  History of fractured ankle.  Takes nsaids daily  OSA:  He is Wearing cpap daily a minimum of 6 hours daily      The risk factors are reflected in the social history.  The roster of all physicians providing medical care to patient - is listed in the Snapshot section of the chart.  Activities of daily living:  The patient is 100% independent in all ADLs: dressing, toileting, feeding as well as independent mobility  Home safety : The patient has smoke detectors in the home. He wears seatbelts.  There are no firearms at home. There is no violence in the home.   There is no risks for hepatitis, STDs or HIV. There is no   history of blood transfusion. There is no travel history to infectious disease endemic areas of the world.  The patient has seen their dentist in the last six month and  their eye doctor in the last year.  They do not  have excessive sun exposure. They have seen a dermatoloigist in the last year. Discussed the need for sun protection: hats, long sleeves and use of sunscreen if there is significant sun exposure.   Diet: the importance of a healthy diet is discussed. They do have a healthy diet.  The benefits of regular aerobic exercise were discussed. He exercises a minimum of 30 minutes  5 days per week. Depression screen: there are no signs or vegative symptoms of depression- irritability, change in appetite, anhedonia, sadness/tearfullness.  The following portions of the patient's history were reviewed and updated as appropriate:  allergies, current medications, past family history, past medical history,  past surgical history, past social history  and problem list.  Visual acuity was not assessed per patient preference since he has regular follow up with his ophthalmologist. Hearing and body mass index were assessed and reviewed.   During the course of the visit the patient was educated and counseled about appropriate screening and preventive services including :  nutrition counseling, colorectal cancer screening, and recommended immunizations.    Review of systems:  Patient denies headache, fevers, malaise, unintentional weight loss, skin rash, eye pain, sinus congestion and sinus pain, sore throat, dysphagia,  hemoptysis , cough, dyspnea, wheezing, chest pain, palpitations, orthopnea, edema, abdominal pain, nausea, melena, diarrhea, constipation, flank pain, dysuria, hematuria, urinary  Frequency, nocturia, numbness, tingling, seizures,  Focal weakness, Loss of consciousness,  Tremor, insomnia, depression, anxiety, and suicidal ideation.    Objective:  BP 132/100 mmHg  Pulse 72  Temp(Src) 98.4 F (36.9 C) (Oral)  Resp 16  Ht 6' 2.75" (1.899 m)  Wt 303 lb (137.44 kg)  BMI 38.11 kg/m2  SpO2 97%  General appearance: alert, cooperative and appears stated age Ears: normal TM's and external ear canals both ears Throat: lips, mucosa, and tongue normal; teeth and gums normal Neck: no adenopathy, no carotid bruit, supple, symmetrical, trachea midline and thyroid not enlarged, symmetric, no tenderness/mass/nodules Back: symmetric, no curvature. ROM normal. No  CVA tenderness. Lungs: clear to auscultation bilaterally Heart: regular rate and rhythm, S1, S2 normal, no murmur, click, rub or gallop Abdomen: soft, non-tender; bowel sounds normal; no masses,  no organomegaly Pulses: 2+ and symmetric Skin: Skin color, texture, turgor normal. No rashes or lesions Lymph nodes: Cervical, supraclavicular, and axillary nodes  normal.  Assessment and Plan:  Problem List Items Addressed This Visit    OSA on CPAP - Primary    BY SLEEP STUDY APRIL 2014,  PRESSURE TITRATED TO 10 CM H20.  Diagnosed by sleep study. he is wearing her CPAP every night a minimum of 6 hours per night and notes improved daytime wakefulness and decreased fatigue       HTN (hypertension)    BP 132/100 mmHg  Pulse 72  Temp(Src) 98.4 F (36.9 C) (Oral)  Resp 16  Ht 6' 2.75" (1.899 m)  Wt 303 lb (137.44 kg)  BMI 38.11 kg/m2  SpO2 97%  Elevated today.  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   Have asked patient to stop NSAIDs and recheck bp at home a minimum of 5 times over the next 4 weeks and call readings to office for adjustment of medications.   Will add hctz if still elevated.  He has proteinuria so will need to continue  ACE/ARB      Relevant Medications   aspirin 81 MG tablet   lisinopril (PRINIVIL,ZESTRIL) 10 MG tablet   Other Relevant Orders   Comprehensive metabolic panel (Completed)   Lipid panel (Completed)   Microalbumin / creatinine urine ratio (Completed)   Routine general medical examination at a health care facility    Annual wellness  exam was done as well as a comprehensive physical exam and management of acute and chronic conditions .  During the course of the visit the patient was educated and counseled about appropriate screening and preventive services including :  diabetes screening, lipid analysis with projected  10 year  risk for CAD , nutrition counseling, colorectal cancer screening, and recommended immunizations.  Printed recommendations for health maintenance screenings was given.       RESOLVED: Fatigue   Relevant Orders   CBC with Differential/Platelet (Completed)   TSH (Completed)   RESOLVED: Obesity    I have addressed  BMI and recommended wt loss of 10% of body weight over the next 6 months using  a low glycemic index diet and regular exercise a minimum of 5 days per week.   Lab Results  Component Value Date   CHOL 152 01/14/2015   HDL 47.30 01/14/2015   LDLCALC 78 01/14/2015   TRIG 136.0 01/14/2015   CHOLHDL 3 01/14/2015   Lab Results  Component Value Date   TSH 0.67 01/14/2015           Other Visit Diagnoses    Testicular mass        Relevant Orders    Ambulatory referral to Urology    Accessory skin tags        Relevant Orders    Ambulatory referral to Dermatology    Need for hepatitis C screening test        Relevant Orders    Hepatitis C antibody (Completed)    Vitamin D deficiency        Relevant Orders    Vit D  25 hydroxy (rtn osteoporosis monitoring) (Completed)

## 2015-01-14 NOTE — Progress Notes (Signed)
Pre-visit discussion using our clinic review tool. No additional management support is needed unless otherwise documented below in the visit note.  

## 2015-01-14 NOTE — Assessment & Plan Note (Addendum)
BY SLEEP STUDY APRIL 2014,  PRESSURE TITRATED TO 10 CM H20.  Diagnosed by sleep study. he is wearing her CPAP every night a minimum of 6 hours per night and notes improved daytime wakefulness and decreased fatigue

## 2015-01-14 NOTE — Patient Instructions (Addendum)
Suspend Red Bull , Aleve and  Motrin for one week  Use tylenol up to 2000 mg daily and tramadol up to 4 daily fo ryour pina.  Goal BP  Is 130/80  ,  If not at goal ,  We will add hctz to lisinopril   This is Dr. Lupita Dawn version of a  "Low GI"  Weight loss Diet.  It is appropriate for all patients with normal renal function , gluten tolerance, and advised for patients who have prediabetes or diabetes:   All of the foods can be found at grocery stores and in bulk at [employer].  The Atkins protein bars and shakes are available in more varieties at Target, [employer]Mart and [place].     7 AM Breakfast:  Choose from the following:  < 5 carbs  Weekdays: Low carbohydrate Protein  Shakes (EAS AdvantEdge "Carb  Control" shakes, Atkins,  Muscle Milk or Premier Protein shakes)     Weekends:  a scrambled egg/bacon/cheese burrito made with Mission's "carb  balance" whole wheat tortilla  (about 10 net carbs )  Eggs,  bacon /sausage , Joseph's pita /lavash bread or  (5 carbs)  A slice of fritatta ( egg based baked dish, no  crust:  google it) (< 10 carbs)   Avoid cereal and bananas, oatmeal and cream of wheat and grits. They are loaded with carbohydrates!   10 AM: high protein snack  (< 5 carbs)   Protein bar by Atkins  Or KIND  (the snack size, < 200 cal, usually < 6 carbs    A stick of cheese:  Around 1 carb,  100 cal      Other so called "protein bars" tend to be loaded with carbohydrates.  Remember, in food advertising, the word "energy" is synonymous for " carbohydrate."  Lunch:   A Sandwich using the bread choices listed, Can use any  Eggs,  lunchmeat, grilled meat or canned tuna).  Can add avocado, regular mayo/mustard  and cheese.  A Salad using blue cheese, ranch,  Goddess dressing  or vinagrette,  No croutons or "confetti" and no "candied nuts" but regular nuts OK.   2 HARD BOILED EGG WHITES AND A CUP OF one of these greek yogurts:    dannon lt n fit greek yogurt         chobani 100 greek  yogurt,    Oikos triple zero greek yogurt       No pretzels or chips.  Pickles and miniature sweet peppers are a good low  carb alternative that provide a "crunch"  The bread is the only source of carbohydrate in a sandwich and  can be decreased by trying some of these alternatives to traditional loaf bread:   Joseph's pita bread and Lavash (flat) bread :  50 cal and 4 net carbs  available at [employer]Js and WalMart.  Taste better when toasted, use as pita chips  [employer] makes a variety of  flatbreads and  A PITA POCKET.    LOOK FOR  THE ONES THAT ARE 17 NET CARBS OR LESS    Mission makes 2 sizes of  Low carb whole wheat tortillas  (The large one is  210 cal and 6 net carbs)   Avoid "Low fat dressings, as well as Barry Brunner and [place] dressings    3 PM/ Mid day  Snack:  Consider  1 ounce of  almonds, walnuts, pistachios, pecans, peanuts,  Macadamia nuts or a nut medley that does  not contain raisins or cranberries.  No "granola"; the dried cranberries and raisins are loaded with carbohydrates. Mixed nuts as long as there are no raisins,  cranberries or dried fruit.    Try the prosciutto/mozzarella cheese sticks by [employer]  In deli /backery section   High protein   To avoid overindulging in snacks: Try drinking a glass of unsweeted almond/coconut milk  Or a cup of coffee with your Atkins chocolate bar to keep you from having 3!!!   Pork rinds!  Yes Pork Rinds are low carb potato chip substitute!   Toasted Joseph's flatbread with hummous dip (chickpeas)       6 PM  Dinner:     Meat/fowl/fish with a green salad, and either broccoli, cauliflower, green beans, spinach, brussel sprouts, bok choy or  Lima beans. Fried in canola oil /olive oil BUT DO NOT BREAD THE PROTEIN!!      There is a low carb pasta by Dreamfield's that is acceptable and tastes great: only 5 digestible carbs/serving.( All grocery stores but BJs carry it )  Prepared Meals:  Try Hurley Cisco Angelo's chicken piccata or chicken or  eggplant parm over low carb pasta.(Lowes and BJs)   Marjory Lies Sanchez's "Carnitas" (pulled pork, no sauce,  0 carbs) or his beef pot roast to make a dinner burrito (at [employer])  Barbecue with cole slaw is low carb BUT NO BUN!  SAME WITH HAMBURGERS     Whole wheat pasta is still full of digestible carbs and  Not as low in glycemic index as Dreamfield's.   Brown rice is still rice,  So skip the rice and noodles if you eat [place] or [place] (or at least limit to 1/2 cup)  9 PM snack :   Breyer's "low carb" fudgsicle or  ice cream bar (Carb Smart line), or  Weight  Watcher's ice cream bar , or another "no sugar added" ice cream;  a serving of fresh berries/cherries with whipped cream   Cheese or greek yogurt   8 ounces of [place] unsweetened almond/cococunut milk  Cheese and crackers (using WASA crackers,  They are low carb) or peanut butter on low carb crackers or pita bread     Avoid bananas, pineapple, grapes  and watermelon on a regular basis because they are high in sugar.  THINK OF THEM AS DESSERT and do not have daily   Remember that snack Substitutions should be less than 10 NET carbs per serving and meals should be < 20 net carbs. Remember that carbohydrates from fiber do not affect blood sugar, so you can  subtract fiber grams to get the "net carbs " of any particular food item.

## 2015-01-15 LAB — HEPATITIS C ANTIBODY: HCV AB: NEGATIVE

## 2015-01-16 ENCOUNTER — Encounter: Payer: Self-pay | Admitting: Internal Medicine

## 2015-01-16 ENCOUNTER — Other Ambulatory Visit: Payer: Self-pay | Admitting: Internal Medicine

## 2015-01-16 DIAGNOSIS — R748 Abnormal levels of other serum enzymes: Secondary | ICD-10-CM

## 2015-01-16 DIAGNOSIS — E669 Obesity, unspecified: Secondary | ICD-10-CM | POA: Insufficient documentation

## 2015-01-16 DIAGNOSIS — E559 Vitamin D deficiency, unspecified: Secondary | ICD-10-CM

## 2015-01-16 MED ORDER — ERGOCALCIFEROL 1.25 MG (50000 UT) PO CAPS: 50000.0000 [IU] | ORAL_CAPSULE | ORAL | Status: DC

## 2015-01-16 NOTE — Assessment & Plan Note (Addendum)
BP 132/100 mmHg  Pulse 72  Temp(Src) 98.4 F (36.9 C) (Oral)  Resp 16  Ht 6' 2.75" (1.899 m)  Wt 303 lb (137.44 kg)  BMI 38.11 kg/m2  SpO2 97%  Elevated today.  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   Have asked patient to stop NSAIDs and recheck bp at home a minimum of 5 times over the next 4 weeks and call readings to office for adjustment of medications.   Will add hctz if still elevated.  He has proteinuria so will need to continue  ACE/ARB

## 2015-01-16 NOTE — Assessment & Plan Note (Addendum)
I have addressed  BMI and recommended wt loss of 10% of body weight over the next 6 months using a low glycemic index diet and regular exercise a minimum of 5 days per week.   Lab Results  Component Value Date   CHOL 152 01/14/2015   HDL 47.30 01/14/2015   LDLCALC 78 01/14/2015   TRIG 136.0 01/14/2015   CHOLHDL 3 01/14/2015   Lab Results  Component Value Date   TSH 0.67 01/14/2015

## 2015-01-16 NOTE — Assessment & Plan Note (Signed)
Annual wellness  exam was done as well as a comprehensive physical exam and management of acute and chronic conditions .  During the course of the visit the patient was educated and counseled about appropriate screening and preventive services including :  diabetes screening, lipid analysis with projected  10 year  risk for CAD , nutrition counseling, colorectal cancer screening, and recommended immunizations.  Printed recommendations for health maintenance screenings was given.

## 2015-02-03 ENCOUNTER — Encounter: Payer: Self-pay | Admitting: Nurse Practitioner

## 2015-02-03 ENCOUNTER — Ambulatory Visit (INDEPENDENT_AMBULATORY_CARE_PROVIDER_SITE_OTHER): Payer: 59 | Admitting: Nurse Practitioner

## 2015-02-03 VITALS — BP 140/100 | HR 72 | Temp 97.2°F | Resp 12 | Ht 74.75 in | Wt 296.8 lb

## 2015-02-03 DIAGNOSIS — E559 Vitamin D deficiency, unspecified: Secondary | ICD-10-CM | POA: Diagnosis not present

## 2015-02-03 DIAGNOSIS — R52 Pain, unspecified: Secondary | ICD-10-CM | POA: Diagnosis not present

## 2015-02-03 DIAGNOSIS — R748 Abnormal levels of other serum enzymes: Secondary | ICD-10-CM

## 2015-02-03 DIAGNOSIS — M25561 Pain in right knee: Secondary | ICD-10-CM | POA: Diagnosis not present

## 2015-02-03 NOTE — Progress Notes (Signed)
   Subjective:    Patient ID: Adam [name], male    DOB: 10-05-1975, 39 y.o.   MRN: [id number]  HPI  Adam [name] is a 39 yo male with a CC of   1) Wrists both thumbs- drive tractor trailer  Right knee pain- bottom of knee and radiating medially  Started yesterday and into today Tramadol helpful    Review of Systems  Constitutional: Negative for fever, chills, diaphoresis, activity change and fatigue.  Eyes: Negative for visual disturbance.  Gastrointestinal: Negative for nausea, vomiting and diarrhea.  Genitourinary: Negative for dysuria and difficulty urinating.  Musculoskeletal: Positive for arthralgias. Negative for myalgias, back pain, joint swelling, gait problem, neck pain and neck stiffness.       Knee- right, and both thumb joints  Skin: Negative for rash.  Neurological: Negative for dizziness, weakness and numbness.      Objective:   Physical Exam  Constitutional: He is oriented to person, place, and time. He appears well-developed and well-nourished. No distress.  BP 140/100 mmHg  Pulse 72  Temp(Src) 97.2 F (36.2 C) (Oral)  Resp 12  Ht 6' 2.75" (1.899 m)  Wt 296 lb 12.8 oz (134.628 kg)  BMI 37.33 kg/m2  SpO2 96%   HENT:  Head: Normocephalic and atraumatic.  Right Ear: External ear normal.  Left Ear: External ear normal.  Cardiovascular: Normal rate, regular rhythm, normal heart sounds and intact distal pulses.  Exam reveals no gallop and no friction rub.   No murmur heard. Pulmonary/Chest: Effort normal and breath sounds normal. No respiratory distress. He has no wheezes. He has no rales. He exhibits no tenderness.  Musculoskeletal: Normal range of motion. He exhibits tenderness. He exhibits no edema.  Tender thumb joints (PIP) especially extension with resistance. Negative finklestein bilaterally  Right knee no tenderness, no laxity, mild crepitus   Neurological: He is alert and oriented to person, place, and time.  Skin: Skin is warm and dry. No rash noted.  He is not diaphoretic.  Psychiatric: He has a normal mood and affect. His behavior is normal. Judgment and thought content normal.      Assessment & Plan:

## 2015-02-03 NOTE — Progress Notes (Signed)
Pre visit review using our clinic review tool, if applicable. No additional management support is needed unless otherwise documented below in the visit note. 

## 2015-02-03 NOTE — Patient Instructions (Addendum)
Vitamin D at your pharmacy!  See note from Dr. Derrel Nip below regarding this:  Also, Your vitamin D is low, which can increase your risk of weak bones and fractures and interfere with your body's ability to absorb the calcium in your diet.  I am calling in a megadose of Vit D to take once weekly for a total of 3 months, Then after you finish the weekly supplement, you should start taking an OTC Vit D3 supplement 1000 units daily.   Visit the lab before leaving today.  We will contact you with results

## 2015-02-04 ENCOUNTER — Encounter: Payer: Self-pay | Admitting: Internal Medicine

## 2015-02-04 LAB — HEPATIC FUNCTION PANEL
ALK PHOS: 60 U/L (ref 39–117)
ALT: 74 U/L — ABNORMAL HIGH (ref 0–53)
AST: 40 U/L — ABNORMAL HIGH (ref 0–37)
Albumin: 4 g/dL (ref 3.5–5.2)
BILIRUBIN DIRECT: 0.2 mg/dL (ref 0.0–0.3)
BILIRUBIN TOTAL: 0.9 mg/dL (ref 0.2–1.2)
TOTAL PROTEIN: 7.6 g/dL (ref 6.0–8.3)

## 2015-02-04 LAB — HEPATITIS B SURFACE ANTIBODY,QUALITATIVE: Hep B S Ab: NEGATIVE

## 2015-02-04 LAB — FERRITIN: FERRITIN: 112.7 ng/mL (ref 22.0–322.0)

## 2015-02-04 LAB — IRON AND TIBC
%SAT: 25 % (ref 20–55)
IRON: 75 ug/dL (ref 42–165)
TIBC: 300 ug/dL (ref 215–435)
UIBC: 225 ug/dL (ref 125–400)

## 2015-02-04 LAB — ANA: Anti Nuclear Antibody(ANA): NEGATIVE

## 2015-02-04 LAB — ANTI-SMITH ANTIBODY: ENA SM Ab Ser-aCnc: <1

## 2015-02-04 LAB — HEPATITIS B CORE ANTIBODY, TOTAL: HEP B C TOTAL AB: NONREACTIVE

## 2015-02-04 LAB — HEPATITIS B SURFACE ANTIGEN: Hepatitis B Surface Ag: NEGATIVE

## 2015-02-04 LAB — URIC ACID: URIC ACID, SERUM: 7.3 mg/dL (ref 4.0–7.8)

## 2015-02-07 ENCOUNTER — Telehealth: Payer: Self-pay

## 2015-02-07 NOTE — Telephone Encounter (Signed)
Notified patient and he verbalized understanding. Encouraged to call back with any questions or concerns.

## 2015-02-15 ENCOUNTER — Ambulatory Visit
Admission: RE | Admit: 2015-02-15 | Discharge: 2015-02-15 | Disposition: A | Discharge status: Home / Self Care | Payer: 59 | Source: Ambulatory Visit | Attending: Internal Medicine | Admitting: Internal Medicine

## 2015-02-15 DIAGNOSIS — R748 Abnormal levels of other serum enzymes: Secondary | ICD-10-CM | POA: Diagnosis present

## 2015-02-15 DIAGNOSIS — K760 Fatty (change of) liver, not elsewhere classified: Secondary | ICD-10-CM | POA: Insufficient documentation

## 2015-02-17 ENCOUNTER — Encounter: Payer: Self-pay | Admitting: *Deleted

## 2015-02-17 ENCOUNTER — Ambulatory Visit: Payer: 59

## 2015-02-20 DIAGNOSIS — K760 Fatty (change of) liver, not elsewhere classified: Secondary | ICD-10-CM | POA: Insufficient documentation

## 2015-02-20 DIAGNOSIS — E559 Vitamin D deficiency, unspecified: Secondary | ICD-10-CM | POA: Insufficient documentation

## 2015-02-20 DIAGNOSIS — K7581 Nonalcoholic steatohepatitis (NASH): Secondary | ICD-10-CM | POA: Insufficient documentation

## 2015-02-20 DIAGNOSIS — M25561 Pain in right knee: Secondary | ICD-10-CM | POA: Insufficient documentation

## 2015-02-20 NOTE — Assessment & Plan Note (Signed)
Gave pt instructions on how to take his Vitamin D on AVS. Pt verbalized understanding.

## 2015-02-20 NOTE — Assessment & Plan Note (Signed)
Pt needing full work up of elevated liver enzymes. Will follow with PCP.

## 2015-02-20 NOTE — Assessment & Plan Note (Addendum)
Pt concerned about gout- I do not suspect this, but he really wants a uric acid level drawn due to family history. Continue tramadol.

## 2015-02-20 NOTE — Assessment & Plan Note (Signed)
ANA drawn.

## 2015-02-22 ENCOUNTER — Ambulatory Visit (INDEPENDENT_AMBULATORY_CARE_PROVIDER_SITE_OTHER): Payer: 59 | Admitting: *Deleted

## 2015-02-22 DIAGNOSIS — Z23 Encounter for immunization: Secondary | ICD-10-CM

## 2015-02-24 NOTE — Telephone Encounter (Signed)
Mailed to pt

## 2015-03-29 ENCOUNTER — Ambulatory Visit: Payer: 59

## 2015-04-27 ENCOUNTER — Other Ambulatory Visit: Payer: Self-pay | Admitting: Internal Medicine

## 2015-05-16 ENCOUNTER — Encounter: Payer: Self-pay | Admitting: Internal Medicine

## 2015-05-16 ENCOUNTER — Ambulatory Visit (INDEPENDENT_AMBULATORY_CARE_PROVIDER_SITE_OTHER): Payer: 59 | Admitting: Internal Medicine

## 2015-05-16 ENCOUNTER — Encounter (INDEPENDENT_AMBULATORY_CARE_PROVIDER_SITE_OTHER): Payer: Self-pay

## 2015-05-16 VITALS — BP 140/90 | HR 61 | Temp 98.3°F | Resp 12 | Ht 75.0 in | Wt 292.5 lb

## 2015-05-16 DIAGNOSIS — E669 Obesity, unspecified: Secondary | ICD-10-CM | POA: Diagnosis not present

## 2015-05-16 DIAGNOSIS — I10 Essential (primary) hypertension: Secondary | ICD-10-CM

## 2015-05-16 DIAGNOSIS — M791 Myalgia, unspecified site: Secondary | ICD-10-CM

## 2015-05-16 DIAGNOSIS — K760 Fatty (change of) liver, not elsewhere classified: Secondary | ICD-10-CM | POA: Diagnosis not present

## 2015-05-16 LAB — COMPREHENSIVE METABOLIC PANEL
ALBUMIN: 4 g/dL (ref 3.5–5.2)
ALK PHOS: 65 U/L (ref 39–117)
ALT: 76 U/L — AB (ref 0–53)
AST: 32 U/L (ref 0–37)
BILIRUBIN TOTAL: 0.9 mg/dL (ref 0.2–1.2)
BUN: 15 mg/dL (ref 6–23)
CALCIUM: 9.7 mg/dL (ref 8.4–10.5)
CO2: 31 mEq/L (ref 19–32)
CREATININE: 1.29 mg/dL (ref 0.40–1.50)
Chloride: 103 mEq/L (ref 96–112)
GFR: 79.56 mL/min (ref >60.00)
Glucose, Bld: 90 mg/dL (ref 70–99)
Potassium: 4.2 mEq/L (ref 3.5–5.1)
Sodium: 139 mEq/L (ref 135–145)
TOTAL PROTEIN: 7.3 g/dL (ref 6.0–8.3)

## 2015-05-16 LAB — CK: CK TOTAL: 280 U/L — AB (ref 7–232)

## 2015-05-16 MED ORDER — LISINOPRIL 20 MG PO TABS: 20.0000 mg | ORAL_TABLET | Freq: Every day | ORAL | Status: DC

## 2015-05-16 NOTE — Patient Instructions (Addendum)
.  Your BP is improving but not at goal.  I am increasing your liisnopril to 20 mg daily.  Goal is 130/80 or less.  Let me know if not at goal in one week   You should Start taking 2000 IUs of D3 daily , and we will recheck you level in  The middle of winter     YOU HAVE DONE GREAT LOSING WEIGHT!! YOU ARE STILL EATING TOO MUCH SUGAR, HOWEVER,   The  diet I discussed with you today is the 10 day Green Smoothie Cleansing /Detox Diet by Linden Dolin . available on [employer] for around $10.  This is not a low carb or a weight loss diet,  It is fundamentally a "cleansing" low fat diet that eliminates sugar, gluten, caffeine, alcohol and dairy for 10 days .  What you add back after the initial ten days is entirely up to  you!  You can expect to lose 5 to 10 lbs depending on how strict you are.   I suggest drinking 2 smoothies daily and keeping one chewable meal (but keep it simple, like baked fish and salad, rice or bok choy) .  You snack primarily on fresh  fruit, egg whites and judicious quantities of nuts. You can add vegetable based protein powder (nothing with whey , since whey is dairy) in it.  [employer] has a [employer], political party .   It does require some form of a nutrient extractor (Vita Mix, a electric juicer,  Or a Nutribullet Rx).  i have found that using frozen fruits is much more convenient and cost effective. You can even find plenty of organic fruit in the frozen fruit section of [employer].  Just thaw what you need for the following day the night before in the refrigerator (to avoid jamming up your machine)    There is a REAL juice bar on [address] in the same plaza with [employer].

## 2015-05-16 NOTE — Progress Notes (Signed)
Subjective:  Patient ID: Adam [name], male    DOB: 1976-07-13  Age: 39 y.o. MRN: [id number]  CC: The primary encounter diagnosis was Fatty liver disease, nonalcoholic. Diagnoses of Muscle pain, Essential hypertension, and Obesity were also pertinent to this visit.  HPI JASYN MEY presents for follow up on hypertension,  Obesity  With  OSA on CPAP and fatty liver by ultrasound .   He has lost 11 lbs since his last visit when diagnosis of fatty liver was suggested by ultrasound.  Per his scale he las lost 20 lbs  Has been working out at a gyn 5 days per week, and following a modified low glycemic index diet.  He has noted the recurrent symptoms of dizziness when he lifts weights with repeat repetitions,  But not with his cardio workouts.  He denies chest pain and shortness of breath.   No asthma attacks in the last 6 months.  Had a wheezing episode last night which resolved spontaneously,  Was not preceded by coughing or eating.  Occurred after working in his garage which was dusty.    Body mass index is 36.56 kg/(m^2).   Outpatient Prescriptions Prior to Visit  Medication Sig Dispense Refill  . albuterol (PROVENTIL HFA;VENTOLIN HFA) 108 (90 BASE) MCG/ACT inhaler Inhale 2 puffs into the lungs every 4 (four) hours as needed for wheezing. 1 Inhaler 0  . aspirin 81 MG tablet Take 81 mg by mouth daily.    . Multiple Vitamins-Minerals (MULTIVITAMIN WITH MINERALS) tablet Take 1 tablet by mouth daily.    . traMADol (ULTRAM) 50 MG tablet Take 1 tablet (50 mg total) by mouth every 6 (six) hours as needed. 120 tablet 0  . lisinopril (PRINIVIL,ZESTRIL) 10 MG tablet TAKE ONE TABLET BY MOUTH ONCE DAILY 90 tablet 0  . ergocalciferol (DRISDOL) 50000 UNITS capsule Take 1 capsule (50,000 Units total) by mouth once a week. (Patient not taking: Reported on 05/16/2015) 12 capsule 0   No facility-administered medications prior to visit.    Review of Systems;  Patient denies headache, fevers, malaise,  unintentional weight loss, skin rash, eye pain, sinus congestion and sinus pain, sore throat, dysphagia,  hemoptysis , cough, dyspnea, wheezing, chest pain, palpitations, orthopnea, edema, abdominal pain, nausea, melena, diarrhea, constipation, flank pain, dysuria, hematuria, urinary  Frequency, nocturia, numbness, tingling, seizures,  Focal weakness, Loss of consciousness,  Tremor, insomnia, depression, anxiety, and suicidal ideation.      Objective:  BP 140/90 mmHg  Pulse 61  Temp(Src) 98.3 F (36.8 C) (Oral)  Resp 12  Ht 6' 3"  (1.905 m)  Wt 292 lb 8 oz (132.677 kg)  BMI 36.56 kg/m2  SpO2 97%  BP Readings from Last 3 Encounters:  05/16/15 140/90  02/03/15 140/100  01/14/15 132/100    Wt Readings from Last 3 Encounters:  05/16/15 292 lb 8 oz (132.677 kg)  02/03/15 296 lb 12.8 oz (134.628 kg)  01/14/15 303 lb (137.44 kg)    General appearance: alert, cooperative and appears stated age Ears: normal TM's and external ear canals both ears Throat: lips, mucosa, and tongue normal; teeth and gums normal Neck: no adenopathy, no carotid bruit, supple, symmetrical, trachea midline and thyroid not enlarged, symmetric, no tenderness/mass/nodules Back: symmetric, no curvature. ROM normal. No CVA tenderness. Lungs: clear to auscultation bilaterally Heart: regular rate and rhythm, S1, S2 normal, no murmur, click, rub or gallop Abdomen: soft, non-tender; bowel sounds normal; no masses,  no organomegaly Pulses: 2+ and symmetric Skin: Skin color, texture, turgor  normal. No rashes or lesions Lymph nodes: Cervical, supraclavicular, and axillary nodes normal.  No results found for: HGBA1C  Lab Results  Component Value Date   CREATININE 1.29 05/16/2015   CREATININE 1.22 01/14/2015   CREATININE 1.3 12/14/2013    Lab Results  Component Value Date   WBC 7.6 01/14/2015   HGB 17.0 01/14/2015   HCT 50.9 01/14/2015   PLT 240.0 01/14/2015   GLUCOSE 90 05/16/2015   CHOL 152 01/14/2015    TRIG 136.0 01/14/2015   HDL 47.30 01/14/2015   LDLCALC 78 01/14/2015   ALT 76* 05/16/2015   AST 32 05/16/2015   NA 139 05/16/2015   K 4.2 05/16/2015   CL 103 05/16/2015   CREATININE 1.29 05/16/2015   BUN 15 05/16/2015   CO2 31 05/16/2015   TSH 0.67 01/14/2015   MICROALBUR 11.9* 01/14/2015    US Abdomen Limited Ruq  02/15/2015   CLINICAL DATA:  Elevated liver enzymes  EXAM: US ABDOMEN LIMITED - RIGHT UPPER QUADRANT  COMPARISON:  None.  FINDINGS: Gallbladder:  No gallstones or wall thickening visualized. No sonographic Murphy sign noted.  Common bile duct:  Diameter: 4 mm  Liver:  Mild to moderate diffusely increased hepatic echogenicity with focal areas of lower echogenicity adjacent to the gallbladder most consistent with diffuse hepatic steatosis with regional sparing adjacent to the gallbladder fossa. No significant focal abnormalities.  IMPRESSION: Hepatic steatosis. Regional sparing adjacent to the gallbladder fossa suggested by ultrasound appearance. If indicated, CT abdomen could be considered to evaluate in greater detail.   Electronically Signed   By: Skipper Cliche M.D.   On: 02/15/2015 08:58    Assessment & Plan:   Problem List Items Addressed This Visit      Unprioritized   HTN (hypertension)    Well controlled on current regimen. Renal function stable, no changes today.  Lab Results  Component Value Date   CREATININE 1.29 05/16/2015   Lab Results  Component Value Date   NA 139 05/16/2015   K 4.2 05/16/2015   CL 103 05/16/2015   CO2 31 05/16/2015         Relevant Medications   lisinopril (PRINIVIL,ZESTRIL) 20 MG tablet   Fatty liver disease, nonalcoholic - Primary    Presumed by ultrasound changes and serologies negative for autoimmune causes of hepatitis.   all modifiable risk factors including obesity, diabetes and hyperlipidemia have been addressed.  He has deferred GI referral at this time.   Lab Results  Component Value Date   ALT 76* 05/16/2015   AST  32 05/16/2015   ALKPHOS 65 05/16/2015   BILITOT 0.9 05/16/2015         Relevant Orders   Comp Met (CMET) (Completed)   Obesity    I have congratulated him on his efforts and success at lowering his  BMI and recommended continued weight loss of 10% of body weight over the next 6 months using a low fat, fruit/vegetable based Mediteranean diet and regular exercise a minimum of 5 days per week.         Other Visit Diagnoses    Muscle pain        Relevant Orders    CK (Completed)       I have discontinued Mr. Marano lisinopril. I am also having him start on lisinopril. Additionally, I am having him maintain his albuterol, multivitamin with minerals, aspirin, traMADol, and ergocalciferol.  Meds ordered this encounter  Medications  . lisinopril (PRINIVIL,ZESTRIL) 20 MG tablet  Sig: Take 1 tablet (20 mg total) by mouth daily.    Dispense:  90 tablet    Refill:  1    Medications Discontinued During This Encounter  Medication Reason  . lisinopril (PRINIVIL,ZESTRIL) 10 MG tablet     Follow-up: Return in about 6 months (around 11/16/2015).   [name], MD

## 2015-05-16 NOTE — Progress Notes (Signed)
Pre-visit discussion using our clinic review tool. No additional management support is needed unless otherwise documented below in the visit note.  

## 2015-05-17 DIAGNOSIS — E669 Obesity, unspecified: Secondary | ICD-10-CM

## 2015-05-17 NOTE — Assessment & Plan Note (Signed)
Well controlled on current regimen. Renal function stable, no changes today.  Lab Results  Component Value Date   CREATININE 1.29 05/16/2015   Lab Results  Component Value Date   NA 139 05/16/2015   K 4.2 05/16/2015   CL 103 05/16/2015   CO2 31 05/16/2015

## 2015-05-17 NOTE — Addendum Note (Signed)
Addended by: [name] on: 05/17/2015 05:55 PM   Modules accepted: Miquel Dunn

## 2015-05-17 NOTE — Assessment & Plan Note (Signed)
Presumed by ultrasound changes and serologies negative for autoimmune causes of hepatitis.   all modifiable risk factors including obesity, diabetes and hyperlipidemia have been addressed.  He has deferred GI referral at this time.   Lab Results  Component Value Date   ALT 76* 05/16/2015   AST 32 05/16/2015   ALKPHOS 65 05/16/2015   BILITOT 0.9 05/16/2015

## 2015-05-17 NOTE — Assessment & Plan Note (Addendum)
I have congratulated him on his efforts and success at lowering his  BMI and recommended continued weight loss of 10% of body weight over the next 6 months using a low fat, fruit/vegetable based Mediteranean diet and regular exercise a minimum of 5 days per week.

## 2015-05-24 ENCOUNTER — Ambulatory Visit (INDEPENDENT_AMBULATORY_CARE_PROVIDER_SITE_OTHER): Payer: 59 | Admitting: *Deleted

## 2015-05-24 DIAGNOSIS — K760 Fatty (change of) liver, not elsewhere classified: Secondary | ICD-10-CM | POA: Diagnosis not present

## 2015-05-24 DIAGNOSIS — Z23 Encounter for immunization: Secondary | ICD-10-CM

## 2015-06-22 ENCOUNTER — Ambulatory Visit (INDEPENDENT_AMBULATORY_CARE_PROVIDER_SITE_OTHER): Payer: 59 | Admitting: Family Medicine

## 2015-06-22 ENCOUNTER — Encounter: Payer: Self-pay | Admitting: Family Medicine

## 2015-06-22 VITALS — BP 142/98 | HR 68 | Temp 98.4°F | Ht 75.0 in | Wt 294.6 lb

## 2015-06-22 DIAGNOSIS — G44229 Chronic tension-type headache, not intractable: Secondary | ICD-10-CM

## 2015-06-22 DIAGNOSIS — H578 Other specified disorders of eye and adnexa: Secondary | ICD-10-CM

## 2015-06-22 DIAGNOSIS — I10 Essential (primary) hypertension: Secondary | ICD-10-CM | POA: Diagnosis not present

## 2015-06-22 DIAGNOSIS — H5789 Other specified disorders of eye and adnexa: Secondary | ICD-10-CM | POA: Insufficient documentation

## 2015-06-22 LAB — BASIC METABOLIC PANEL
BUN: 20 mg/dL (ref 6–23)
CALCIUM: 9.6 mg/dL (ref 8.4–10.5)
CHLORIDE: 101 meq/L (ref 96–112)
CO2: 31 meq/L (ref 19–32)
CREATININE: 1.19 mg/dL (ref 0.40–1.50)
GFR: 87.28 mL/min (ref >60.00)
GLUCOSE: 93 mg/dL (ref 70–99)
Potassium: 3.8 mEq/L (ref 3.5–5.1)
SODIUM: 140 meq/L (ref 135–145)

## 2015-06-22 MED ORDER — LISINOPRIL 40 MG PO TABS: 40.0000 mg | ORAL_TABLET | Freq: Every day | ORAL | Status: DC

## 2015-06-22 NOTE — Progress Notes (Signed)
Pre visit review using our clinic review tool, if applicable. No additional management support is needed unless otherwise documented below in the visit note. 

## 2015-06-22 NOTE — Patient Instructions (Addendum)
Nice to meet you. We will refer you to an eye doctor for further evaluation of your eyes.  We will increase your lisinopril to 40 mg daily. Return in one week for blood work. If you develop eye pain, change in vision, or headaches please seek medical attention.  [facility] [address] [place], [place] [address]

## 2015-06-22 NOTE — Assessment & Plan Note (Signed)
Suspect this is tension HA in nature due to lack of sleep, though with eye complaints could be glaucoma related. Is neurologically intact. Has history of similar headaches. Will have otho eval patient this afternoon. Continue to monitor for recurrence if optho eval is negative. Given return precautions.

## 2015-06-22 NOTE — Assessment & Plan Note (Signed)
Elevated today and reports elevated BPs on check at home. Asymptomatic. Discussed addition of amlodipine vs increased dose of lisinopril. Will increase lisinopril to 40 mg daily. Check bmet today and in one week.

## 2015-06-22 NOTE — Progress Notes (Signed)
Patient ID: IDEN STRIPLING, male   DOB: 03/17/1976, 39 y.o.   MRN: [id number]  Tommi Rumps, MD Phone: [phone]  ALEKS NAWROT is a 39 y.o. male who presents today for same day appointment.  Eye redness: patient notes red eyes for the past month. Notes they are in the inner corners of his eyes. Notes he used visine and this helped some, though did not resolve the issues. He notes yesterday both eyes hurt and burned. Took motrin and this resolved. Had right sided headache last week for a couple hours one day that went away with sleep. No vision changes. No gritty sensation or discharge. Denies numbness and weakness with his headache. Notes he has a history of these types of headaches when he does not sleep well and notes he did not sleep well the night before. No HA since that time. He also notes he had a sinus infection last week with nasal congestion and rhinorrhea. No fevers. This resolved. No headache at this time. No eye pain at this time. No vision changes. At the end of the visit the patient noted that he may have gotten glass in his eye or windshield washer fluid in his eye some time last week while hauling a car.   HYPERTENSION Disease Monitoring Home BP Monitoring checks some at home, last was 2 weeks ago and was 174/70 Chest pain- no    Dyspnea- no Medications Compliance-  Taking lisinopril.  Edema- no  PMH: nonsmoker.   ROS see HPI  Objective  Physical Exam Filed Vitals:   06/22/15 1201  BP: 142/98  Pulse:   Temp:     Physical Exam  Constitutional: He is well-developed, well-nourished, and in no distress.  HENT:  Head: Normocephalic and atraumatic.  Right Ear: External ear normal.  Left Ear: External ear normal.  Mouth/Throat: Oropharynx is clear and moist. No oropharyngeal exudate.  Normal TM bilaterally  Eyes: Pupils are equal, round, and reactive to light.  Right eye with conjunctival erythema in the medial portion of the sclera, no apparent foreign object  noted, normal pupil Left eye with conjunctival erythema in the medial and lateral portions of the sclera, no apparent foreign object noted, normal pupil  Neck: Neck supple.  Cardiovascular: Normal rate, regular rhythm and normal heart sounds.  Exam reveals no gallop and no friction rub.   No murmur heard. Pulmonary/Chest: Effort normal and breath sounds normal. No respiratory distress. He has no wheezes. He has no rales.  Lymphadenopathy:    He has no cervical adenopathy.  Neurological: He is alert.  CN 2-12 intact, 5/5 strength in bilateral biceps, triceps, grip, quads, hamstrings, plantar and dorsiflexion, sensation to light touch intact in bilateral UE and LE, normal gait, 2+ patellar reflexes  Skin: He is not diaphoretic.     Assessment/Plan: Please see individual problem list.  Eye redness Patient with bilateral eye redness likely related to allergic conjunctivitis vs viral issue vs irritant/foreing body given history. Lack of discharge makes viral cause unlikely. Gave history of possible foreign body. Could be glaucoma with recent headache and eye pain, though patient is well appearing with no pain at this time and no headache. Vision normal. Given possibility of foreign body and eye pain yesterday will refer to optho for eval today to evaluate for possible foreign body and eye pressures. Appointment at 1:10 pm at [facility] eye care. Given return precautions.   Headache Suspect this is tension HA in nature due to lack of sleep, though with eye  complaints could be glaucoma related. Is neurologically intact. Has history of similar headaches. Will have otho eval patient this afternoon. Continue to monitor for recurrence if optho eval is negative. Given return precautions.   HTN (hypertension) Elevated today and reports elevated BPs on check at home. Asymptomatic. Discussed addition of amlodipine vs increased dose of lisinopril. Will increase lisinopril to 40 mg daily. Check bmet today and in  one week.     Orders Placed This Encounter  Procedures  . Basic Metabolic Panel (BMET)  . Basic Metabolic Panel (BMET)    Standing Status: Future     Number of Occurrences:      Standing Expiration Date: 06/21/2016  . Ambulatory referral to Ophthalmology    Referral Priority:  Urgent    Referral Type:  Consultation    Referral Reason:  Specialty Services Required    Requested Specialty:  Ophthalmology    Number of Visits Requested:  1    Meds ordered this encounter  Medications  . lisinopril (PRINIVIL,ZESTRIL) 40 MG tablet    Sig: Take 1 tablet (40 mg total) by mouth daily.    Dispense:  90 tablet    Refill:  1    Tommi Rumps

## 2015-06-22 NOTE — Assessment & Plan Note (Addendum)
Patient with bilateral eye redness likely related to allergic conjunctivitis vs viral issue vs irritant/foreing body given history. Lack of discharge makes viral cause unlikely. Gave history of possible foreign body. Could be glaucoma with recent headache and eye pain, though patient is well appearing with no pain at this time and no headache. Vision normal. Given possibility of foreign body and eye pain yesterday will refer to optho for eval today to evaluate for possible foreign body and eye pressures. Appointment at 1:10 pm at [facility] eye care. Given return precautions.

## 2015-06-28 ENCOUNTER — Ambulatory Visit: Payer: 59

## 2015-06-30 ENCOUNTER — Ambulatory Visit (INDEPENDENT_AMBULATORY_CARE_PROVIDER_SITE_OTHER): Payer: 59 | Admitting: Surgical

## 2015-06-30 DIAGNOSIS — Z23 Encounter for immunization: Secondary | ICD-10-CM

## 2015-06-30 NOTE — Progress Notes (Signed)
Per Adam [name] patient is supposed to have Twinrix Hep A and B combination.  Gave the injection in Rt deltoid. Patient tolerated well.

## 2015-07-18 ENCOUNTER — Telehealth: Payer: Self-pay | Admitting: Family Medicine

## 2015-07-18 ENCOUNTER — Ambulatory Visit (INDEPENDENT_AMBULATORY_CARE_PROVIDER_SITE_OTHER): Payer: 59 | Admitting: Family Medicine

## 2015-07-18 ENCOUNTER — Ambulatory Visit: Payer: 59 | Admitting: Internal Medicine

## 2015-07-18 ENCOUNTER — Encounter: Payer: Self-pay | Admitting: Family Medicine

## 2015-07-18 VITALS — BP 146/110 | HR 65 | Temp 98.5°F | Ht 75.0 in | Wt 301.6 lb

## 2015-07-18 DIAGNOSIS — I10 Essential (primary) hypertension: Secondary | ICD-10-CM | POA: Diagnosis not present

## 2015-07-18 LAB — BASIC METABOLIC PANEL
BUN: 17 mg/dL (ref 6–23)
CHLORIDE: 103 meq/L (ref 96–112)
CO2: 31 meq/L (ref 19–32)
Calcium: 9.8 mg/dL (ref 8.4–10.5)
Creatinine, Ser: 1.28 mg/dL (ref 0.40–1.50)
GFR: 80.21 mL/min (ref >60.00)
GLUCOSE: 96 mg/dL (ref 70–99)
POTASSIUM: 4.4 meq/L (ref 3.5–5.1)
SODIUM: 140 meq/L (ref 135–145)

## 2015-07-18 MED ORDER — AMLODIPINE BESYLATE 5 MG PO TABS: 5.0000 mg | ORAL_TABLET | Freq: Every day | ORAL | Status: DC

## 2015-07-18 NOTE — Assessment & Plan Note (Addendum)
Uncontrolled at this time. He is asymptomatic at this time and has no signs of end organ damage on history or exam. He is well-appearing. Normal vision screening today. Discussed with patient that we will need to add another blood pressure medicine to his regimen and we will add amlodipine. We'll start with amlodipine 5 mg daily to ensure that we do not drop his blood pressure too quickly given that he is been elevated for some time. We will check a Bmet to ensure that there is no associated kidney damage. He would likely benefit from full dose amlodipine and from another blood pressure medicine down the line. Discussed that we will need to monitor the vertigo sensation that he has had with lifting weights. It is reassuring that this is been going on for a prolonged period of time with no change. He was advised not to lift heavy weights. If this is a recurrent issue he will need further evaluation. He will inform us if this recurs again. He'll check his blood pressure daily at home. He'll return in one week for nurse visit for blood pressure check and in one month for follow-up with Dr. Derrel Nip. He is given return precautions.

## 2015-07-18 NOTE — Progress Notes (Signed)
Patient ID: GIACOMO VALONE, male   DOB: Dec 15, 1975, 39 y.o.   MRN: [id number]  Tommi Rumps, MD Phone: [phone]  MELESIO MADARA is a 39 y.o. male who presents today for follow-up.  Hypertension: The patient notes he has not been checking his blood pressure at home. He notes he has been taking lisinopril 40 mg daily. He denies chest pain, shortness of breath, edema, vision changes, numbness, and weakness. He does note mild bitemporal and frontal headaches most days. He has no neurological issues with those. He notes he has had these for years. He does not have a headache at this time. He did note some episodes of vertigo only when lifting weights and this has been an issue for decades. He notes this only occurs with lifting heavy weights such as weights greater than 300-400 pounds. He notes he stopped this 3-4 weeks ago and has not had recurrence of this issue since that time. He had no other symptoms with that. The vertigo resolved with rest. He did not have any chest pain, shortness of breath, or palpitations with this. He notes he stopped lifting weights like this and started doing cardio and other exercises and has not had any issues with those. Not had any vertigo with cardio. He has not had any chest pain or trouble breathing with cardio. Patient reports he feels well at this time with no complaints.  PMH: nonsmoker.   ROS see history of present illness  Objective  Physical Exam Filed Vitals:   07/18/15 1013  BP: 146/110  Pulse: 65  Temp: 98.5 F (36.9 C)  laying blood pressure 162/112 pulse 66 Sitting blood pressure 164/118 pulse 66  standing blood pressure 168/118 pulse 75  Physical Exam  Constitutional: He is well-developed, well-nourished, and in no distress.  HENT:  Head: Normocephalic and atraumatic.  Right Ear: External ear normal.  Left Ear: External ear normal.  Mouth/Throat: Oropharynx is clear and moist. No oropharyngeal exudate.  Eyes: Conjunctivae are normal.  Pupils are equal, round, and reactive to light.  Neck: Neck supple.  Cardiovascular: Normal rate, regular rhythm and normal heart sounds.  Exam reveals no gallop and no friction rub.   No murmur heard. Pulmonary/Chest: Effort normal and breath sounds normal. No respiratory distress. He has no wheezes. He has no rales.  Lymphadenopathy:    He has no cervical adenopathy.  Neurological: He is alert.  CN 2-12 intact, 5/5 strength in bilateral biceps, triceps, grip, quads, hamstrings, plantar and dorsiflexion, sensation to light touch intact in bilateral UE and LE, normal gait, 2+ patellar reflexes  Skin: Skin is warm and dry. He is not diaphoretic.     Assessment/Plan: Please see individual problem list.  HTN (hypertension) Uncontrolled at this time. He is asymptomatic at this time and has no signs of end organ damage on history or exam. He is well-appearing. Normal vision screening today. Discussed with patient that we will need to add another blood pressure medicine to his regimen and we will add amlodipine. We'll start with amlodipine 5 mg daily to ensure that we do not drop his blood pressure too quickly given that he is been elevated for some time. We will check a Bmet to ensure that there is no associated kidney damage. He would likely benefit from full dose amlodipine and from another blood pressure medicine down the line. Discussed that we will need to monitor the vertigo sensation that he has had with lifting weights. It is reassuring that this is been going  on for a prolonged period of time with no change. He was advised not to lift heavy weights. If this is a recurrent issue he will need further evaluation. He will inform us if this recurs again. He'll check his blood pressure daily at home. He'll return in one week for nurse visit for blood pressure check and in one month for follow-up with Dr. Derrel Nip. He is given return precautions.    Orders Placed This Encounter  Procedures  . Basic  Metabolic Panel (BMET)    Meds ordered this encounter  Medications  . amLODipine (NORVASC) 5 MG tablet    Sig: Take 1 tablet (5 mg total) by mouth daily.    Dispense:  90 tablet    Refill:  1    Tommi Rumps

## 2015-07-18 NOTE — Progress Notes (Signed)
Pre visit review using our clinic review tool, if applicable. No additional management support is needed unless otherwise documented below in the visit note. 

## 2015-07-18 NOTE — Patient Instructions (Addendum)
Nice to see you.  Very going to add amlodipine to her blood pressure regimen. You should check your blood pressure at home each day. We are going to check blood work today to make sure your kidney function is okay.  Please set up a nurse visit in one week for blood pressure check.  If you develop chest pain, shortness of breath, swelling, headache, vision changes, numbness, weakness, dizziness, lightheadedness, or any new symptoms please seek medical attention.

## 2015-07-18 NOTE — Telephone Encounter (Signed)
Called and spoke with patient regarding lab results. Informed him that his blood work was acceptable. His creatinine is 1.28, previously it was 1.19, though the time prior to that was 1.29. His baseline creatinine appears to be between 1.2 and 1.3. Advised that we would continue to monitor this given his blood pressure elevations. If blood pressure continues to be elevated at his next check we will need to recheck this lab level.

## 2015-08-17 ENCOUNTER — Ambulatory Visit (INDEPENDENT_AMBULATORY_CARE_PROVIDER_SITE_OTHER): Payer: 59 | Admitting: Internal Medicine

## 2015-08-17 DIAGNOSIS — K760 Fatty (change of) liver, not elsewhere classified: Secondary | ICD-10-CM

## 2015-08-19 NOTE — Progress Notes (Signed)
Patient failed to keep scheduled appointment and will be charged a no show fee.

## 2015-08-19 NOTE — Assessment & Plan Note (Signed)
Patient failed to keep scheduled appointment and will be charged a no show fee.

## 2015-09-08 ENCOUNTER — Encounter: Payer: Self-pay | Admitting: Family Medicine

## 2015-09-08 ENCOUNTER — Ambulatory Visit (INDEPENDENT_AMBULATORY_CARE_PROVIDER_SITE_OTHER): Payer: 59 | Admitting: Family Medicine

## 2015-09-08 VITALS — BP 138/94 | HR 78 | Temp 98.6°F | Ht 75.0 in | Wt 306.4 lb

## 2015-09-08 DIAGNOSIS — J029 Acute pharyngitis, unspecified: Secondary | ICD-10-CM | POA: Diagnosis not present

## 2015-09-08 NOTE — Assessment & Plan Note (Signed)
New Problem. Centor criteria - 1. Likely viral in origin. Rapid strep negative. Sending for culture. Continued supportive care and OTC Ibuprofen.

## 2015-09-08 NOTE — Patient Instructions (Signed)
Continue supportive care with Ibuprofen, warm salt water gargles and rest.  We will call with the culture results.  If you worsen let me know.  Merry Christmas  Dr. Lacinda Axon

## 2015-09-08 NOTE — Progress Notes (Signed)
   Subjective:  Patient ID: Adam [name], male    DOB: 01/21/1976  Age: 39 y.o. MRN: [id number]  CC: Sore throat  HPI:  39 year old male presents for an acute visit with complaints of sore throat.  1) Sore throat  Patient reports he's had sore throat for the past week.  He states that he's been improving but worsened earlier today.  He does report that he's had a cough this week as well.  Additionally, he reports associated tender anterior neck and ear pain bilaterally.  No associated fevers or chills.   He's been taking over-the-counter cough medicine and ibuprofen with some improvement.  No other complaints today.  Social Hx   Social History   Social History  . Marital Status: Married    Spouse Name: Lattie Haw  . Number of Children: 3  . Years of Education: 12   Occupational History  . [employer]   Social History Main Topics  . Smoking status: Never Smoker   . Smokeless tobacco: Never Used  . Alcohol Use: No  . Drug Use: No  . Sexual Activity: Yes   Other Topics Concern  . None   Social History Narrative   Review of Systems  Constitutional: Negative for fever.  HENT: Positive for ear pain and sore throat.    Objective:  BP 138/94 mmHg  Pulse 78  Temp(Src) 98.6 F (37 C) (Oral)  Ht 6' 3"  (1.905 m)  Wt 306 lb 6 oz (138.971 kg)  BMI 38.29 kg/m2  SpO2 95%  BP/Weight 09/08/2015 07/18/2015 1/74/9449  Systolic BP 675 916 384  Diastolic BP 94 665 98  Wt. (Lbs) 306.38 301.6 294.6  BMI 38.29 37.7 36.82   Physical Exam  Constitutional: He appears well-developed. No distress.  HENT:  Head: Normocephalic and atraumatic.  Right Ear: External ear normal.  Left Ear: External ear normal.  Normal TM's bilaterally. Oropharynx with mild erythema. No exudate noted.  Neck: Neck supple.  Anterior cervical chain mildly tender to palpation; however no appreciable lymphadenopathy.  Cardiovascular: Normal rate and regular rhythm.     No murmur heard. Pulmonary/Chest: Effort normal and breath sounds normal. He has no wheezes. He has no rales.  Neurological: He is alert.  Vitals reviewed.  Lab Results  Component Value Date   WBC 7.6 01/14/2015   HGB 17.0 01/14/2015   HCT 50.9 01/14/2015   PLT 240.0 01/14/2015   GLUCOSE 96 07/18/2015   CHOL 152 01/14/2015   TRIG 136.0 01/14/2015   HDL 47.30 01/14/2015   LDLCALC 78 01/14/2015   ALT 76* 05/16/2015   AST 32 05/16/2015   NA 140 07/18/2015   K 4.4 07/18/2015   CL 103 07/18/2015   CREATININE 1.28 07/18/2015   BUN 17 07/18/2015   CO2 31 07/18/2015   TSH 0.67 01/14/2015   MICROALBUR 11.9* 01/14/2015   Assessment & Plan:   Problem List Items Addressed This Visit    Acute pharyngitis - Primary    [name] Problem. Centor criteria - 1. Likely viral in origin. Rapid strep negative. Sending for culture. Continued supportive care and OTC Ibuprofen.      Relevant Orders   POCT rapid strep A   Culture, Group A Strep     Follow-up: Return if symptoms worsen or fail to improve.  [place]

## 2015-09-10 LAB — CULTURE, GROUP A STREP: Organism ID, Bacteria: NORMAL

## 2015-10-10 ENCOUNTER — Telehealth: Payer: Self-pay | Admitting: *Deleted

## 2015-10-10 NOTE — Telephone Encounter (Signed)
Patient stated that his blood pressure medication lisinopril and amlodipine are not working. He would like a call back in consult to his medication. Please advise  Contact:[phone]

## 2015-10-12 ENCOUNTER — Encounter: Payer: Self-pay | Admitting: Family Medicine

## 2015-10-12 ENCOUNTER — Ambulatory Visit (INDEPENDENT_AMBULATORY_CARE_PROVIDER_SITE_OTHER): Payer: 59 | Admitting: Family Medicine

## 2015-10-12 ENCOUNTER — Telehealth: Payer: Self-pay | Admitting: Internal Medicine

## 2015-10-12 VITALS — BP 138/94 | HR 82 | Temp 98.3°F | Ht 75.0 in | Wt 303.1 lb

## 2015-10-12 DIAGNOSIS — R55 Syncope and collapse: Secondary | ICD-10-CM | POA: Diagnosis not present

## 2015-10-12 DIAGNOSIS — I10 Essential (primary) hypertension: Secondary | ICD-10-CM | POA: Diagnosis not present

## 2015-10-12 DIAGNOSIS — R0989 Other specified symptoms and signs involving the circulatory and respiratory systems: Secondary | ICD-10-CM | POA: Insufficient documentation

## 2015-10-12 MED ORDER — LISINOPRIL-HYDROCHLOROTHIAZIDE 20-12.5 MG PO TABS: 2.0000 | ORAL_TABLET | Freq: Every day | ORAL | Status: DC

## 2015-10-12 NOTE — Progress Notes (Signed)
Subjective:  Patient ID: Adam [name], male    DOB: 1976-06-09  Age: 40 y.o. MRN: [id number]  CC: Elevated BP, Dizziness/lightheadedness  HPI:  40 year old obese male with a past medical history of hypertension and OSA presents with the above issues.  Elevated BP  Patient reports that since Saturday he's had intermittent elevated blood pressure.  On Saturday he had an episode of high blood pressure with associated visual disturbance where he "could not see" for a few seconds. At that time his blood pressure was 200/146.   He reports continued intermittent elevation of his blood pressure.  He endorses compliance with lisinopril and amlodipine.  He denies any associated chest pain or shortness of breath. However, he states that he has had an episode of chest pain recently.  No known inciting factor. No exacerbating or relieving factors.   Dizziness/lightheadedness  Patient reports that he has had prior issues with dizziness/lightheadedness particularly with lifting heavy weights at the gym.  More recently he states it is been occurring several times when he exercises.  He describes it as feeling as if he is going to pass out.  Improves with rest.  No associated chest pain.  Social Hx   Social History   Social History  . Marital Status: Married    Spouse Name: Lattie Haw  . Number of Children: 3  . Years of Education: 12   Occupational History  . [employer]   Social History Main Topics  . Smoking status: Never Smoker   . Smokeless tobacco: Never Used  . Alcohol Use: No  . Drug Use: No  . Sexual Activity: Yes   Other Topics Concern  . None   Social History Narrative   Review of Systems  Eyes: Positive for visual disturbance.  Cardiovascular:       Elevated BP.  Neurological: Positive for dizziness.   Objective:  BP 138/94 mmHg  Pulse 82  Temp(Src) 98.3 F (36.8 C) (Oral)  Ht 6' 3"  (1.905 m)  Wt 303 lb 2 oz (137.497  kg)  BMI 37.89 kg/m2  SpO2 95%  BP/Weight 10/12/2015 09/08/2015 17/00/1749  Systolic BP 449 675 916  Diastolic BP 94 94 384  Wt. (Lbs) 303.13 306.38 301.6  BMI 37.89 38.29 37.7   Physical Exam  Constitutional: He is oriented to person, place, and time. He appears well-developed. No distress.  Eyes: Conjunctivae are normal.  Cardiovascular: Normal rate and regular rhythm.   Pulmonary/Chest: Effort normal and breath sounds normal.  Neurological: He is alert and oriented to person, place, and time.  Psychiatric: He has a normal mood and affect.  Vitals reviewed.   Lab Results  Component Value Date   WBC 7.6 01/14/2015   HGB 17.0 01/14/2015   HCT 50.9 01/14/2015   PLT 240.0 01/14/2015   GLUCOSE 96 07/18/2015   CHOL 152 01/14/2015   TRIG 136.0 01/14/2015   HDL 47.30 01/14/2015   LDLCALC 78 01/14/2015   ALT 76* 05/16/2015   AST 32 05/16/2015   NA 140 07/18/2015   K 4.4 07/18/2015   CL 103 07/18/2015   CREATININE 1.28 07/18/2015   BUN 17 07/18/2015   CO2 31 07/18/2015   TSH 0.67 01/14/2015   MICROALBUR 11.9* 01/14/2015   ED ECG REPORT   Date: 10/12/2015  EKG Time: 11:16 PM  Rate: 77  Rhythm: normal sinus rhythm  Axis: Normal axis  Intervals: Normal intervals.  ST&T Change: No.  Narrative Interpretation: Normal sinus  rhythm without signs of ischemia.  Assessment & Plan:   Problem List Items Addressed This Visit    Pre-syncope    Given symptoms with exertion and elevated BP's, EKG was obtained today. No signs of ischemia. Sending to cardiology for eval given labile hypertension and pre-syncope with exertion.      Relevant Medications   lisinopril-hydrochlorothiazide (ZESTORETIC) 20-12.5 MG tablet   Other Relevant Orders   Ambulatory referral to Cardiology   EKG 12-Lead (Completed)   Labile hypertension - Primary    BP improved from recently elevated readings. Given elevated pressures, stopping Lisinopril and start lisinopril/HCTZ. Patient to continue  Norvasc.       Relevant Medications   lisinopril-hydrochlorothiazide (ZESTORETIC) 20-12.5 MG tablet   Other Relevant Orders   Ambulatory referral to Cardiology      Meds ordered this encounter  Medications  . lisinopril-hydrochlorothiazide (ZESTORETIC) 20-12.5 MG tablet    Sig: Take 2 tablets by mouth daily.    Dispense:  180 tablet    Refill:  0    Follow-up: PRN  [place]

## 2015-10-12 NOTE — Telephone Encounter (Signed)
Patient Name: Adam [name]  DOB: December 13, 1975    Initial Comment Caller States BP 200/146, has headache, spotty vision   Nurse Assessment  Nurse: Leilani Merl, RN, Heather Date/Time (Eastern Time): 10/12/2015 10:01:35 AM  Confirm and document reason for call. If symptomatic, describe symptoms. You must click the next button to save text entered. ---Caller States BP 200/146, has headache, spotty vision  Has the patient traveled out of the country within the last 30 days? ---Not Applicable  Does the patient have any new or worsening symptoms? ---Yes  Will a triage be completed? ---Yes  Related visit to physician within the last 2 weeks? ---No  Does the PT have any chronic conditions? (i.e. diabetes, asthma, etc.) ---Yes  List chronic conditions. ---HTN  Is this a behavioral health or substance abuse call? ---No     Guidelines    Guideline Title Affirmed Question Affirmed Notes  High Blood Pressure [1] BP ? 200/120 AND [2] having NO cardiac or neurologic symptoms    Final Disposition User   See Physician within 4 Hours (or PCP triage) Leilani Merl, RN, Nira Conn    Comments  Appt made with Dr. Lacinda Axon at 1:15 today.   Referrals  REFERRED TO PCP OFFICE   Disagree/Comply: Comply

## 2015-10-12 NOTE — Assessment & Plan Note (Signed)
Given symptoms with exertion and elevated BP's, EKG was obtained today. No signs of ischemia. Sending to cardiology for eval given labile hypertension and pre-syncope with exertion.

## 2015-10-12 NOTE — Assessment & Plan Note (Signed)
BP improved from recently elevated readings. Given elevated pressures, stopping Lisinopril and start lisinopril/HCTZ. Patient to continue Norvasc.

## 2015-10-12 NOTE — Progress Notes (Signed)
Pre visit review using our clinic review tool, if applicable. No additional management support is needed unless otherwise documented below in the visit note. 

## 2015-10-12 NOTE — Telephone Encounter (Signed)
FYI your seeing this pt at at 1:15pm today./tvw

## 2015-10-12 NOTE — Patient Instructions (Signed)
Stop the lisinopril 40 mg. Start the [place]. Continue the Norvasc.  We will be in touch regarding your referral to cardiology.  Follow up closely with Dr. Derrel Nip.  Take care  Dr. Lacinda Axon

## 2015-10-17 ENCOUNTER — Encounter: Payer: Self-pay | Admitting: Internal Medicine

## 2015-10-17 ENCOUNTER — Ambulatory Visit (INDEPENDENT_AMBULATORY_CARE_PROVIDER_SITE_OTHER): Payer: 59 | Admitting: Internal Medicine

## 2015-10-17 VITALS — BP 138/95 | HR 78 | Temp 98.2°F | Resp 12 | Ht 75.0 in | Wt 299.1 lb

## 2015-10-17 DIAGNOSIS — I10 Essential (primary) hypertension: Secondary | ICD-10-CM

## 2015-10-17 DIAGNOSIS — K760 Fatty (change of) liver, not elsewhere classified: Secondary | ICD-10-CM | POA: Diagnosis not present

## 2015-10-17 DIAGNOSIS — E669 Obesity, unspecified: Secondary | ICD-10-CM

## 2015-10-17 MED ORDER — AMLODIPINE BESYLATE 5 MG PO TABS: 5.0000 mg | ORAL_TABLET | Freq: Every day | ORAL | Status: DC

## 2015-10-17 NOTE — Progress Notes (Signed)
Subjective:  Patient ID: Adam [name], male    DOB: January 09, 1976  Age: 40 y.o. MRN: [id number]  CC: The primary encounter diagnosis was Essential hypertension. Diagnoses of Obesity and Fatty liver disease, nonalcoholic were also pertinent to this visit.  HPI Adam [name] presents for follow up on uncontrolled hypertension .  Medication regimen  was changed from lisinopril to  to lisinopril hct by Dr Lacinda Axon and cardiology referral was made for evaluatio of symptoms concerning for exertional angina equivalent.    Patient has been measuring BP at home using a wrist cuff .  His readings have been very elevated by as much as 40  pts compared to reading done today in the office.   He is exercising regularly and following a low carbohydrate diet .  He has lost 7 lbs in the last 5 weeks    Outpatient Prescriptions Prior to Visit  Medication Sig Dispense Refill  . aspirin 81 MG tablet Take 81 mg by mouth daily.    . ergocalciferol (DRISDOL) 50000 UNITS capsule Take 1 capsule (50,000 Units total) by mouth once a week. 12 capsule 0  . lisinopril-hydrochlorothiazide (ZESTORETIC) 20-12.5 MG tablet Take 2 tablets by mouth daily. 180 tablet 0  . Multiple Vitamins-Minerals (MULTIVITAMIN WITH MINERALS) tablet Take 1 tablet by mouth daily.    . traMADol (ULTRAM) 50 MG tablet Take 1 tablet (50 mg total) by mouth every 6 (six) hours as needed. 120 tablet 0  . albuterol (PROVENTIL HFA;VENTOLIN HFA) 108 (90 BASE) MCG/ACT inhaler Inhale 2 puffs into the lungs every 4 (four) hours as needed for wheezing. 1 Inhaler 0  . amLODipine (NORVASC) 5 MG tablet Take 1 tablet (5 mg total) by mouth daily. (Patient not taking: Reported on 10/17/2015) 90 tablet 1   No facility-administered medications prior to visit.    Review of Systems;  Patient denies headache, fevers, malaise, unintentional weight loss, skin rash, eye pain, sinus congestion and sinus pain, sore throat, dysphagia,  hemoptysis , cough, dyspnea, wheezing,  chest pain, palpitations, orthopnea, edema, abdominal pain, nausea, melena, diarrhea, constipation, flank pain, dysuria, hematuria, urinary  Frequency, nocturia, numbness, tingling, seizures,  Focal weakness, Loss of consciousness,  Tremor, insomnia, depression, anxiety, and suicidal ideation.      Objective:  BP 138/95 mmHg  Pulse 78  Temp(Src) 98.2 F (36.8 C) (Oral)  Resp 12  Ht 6' 3"  (1.905 m)  Wt 299 lb 2 oz (135.682 kg)  BMI 37.39 kg/m2  SpO2 97%  BP Readings from Last 3 Encounters:  10/17/15 138/95  10/12/15 138/94  09/08/15 138/94    Wt Readings from Last 3 Encounters:  10/17/15 299 lb 2 oz (135.682 kg)  10/12/15 303 lb 2 oz (137.497 kg)  09/08/15 306 lb 6 oz (138.971 kg)    General appearance: alert, cooperative and appears stated age Ears: normal TM's and external ear canals both ears Throat: lips, mucosa, and tongue normal; teeth and gums normal Neck: no adenopathy, no carotid bruit, supple, symmetrical, trachea midline and thyroid not enlarged, symmetric, no tenderness/mass/nodules Back: symmetric, no curvature. ROM normal. No CVA tenderness. Lungs: clear to auscultation bilaterally Heart: regular rate and rhythm, S1, S2 normal, no murmur, click, rub or gallop Abdomen: soft, non-tender; bowel sounds normal; no masses,  no organomegaly Pulses: 2+ and symmetric Skin: Skin color, texture, turgor normal. No rashes or lesions Lymph nodes: Cervical, supraclavicular, and axillary nodes normal.  No results found for: HGBA1C  Lab Results  Component Value Date   CREATININE 1.28  07/18/2015   CREATININE 1.19 06/22/2015   CREATININE 1.29 05/16/2015    Lab Results  Component Value Date   WBC 7.6 01/14/2015   HGB 17.0 01/14/2015   HCT 50.9 01/14/2015   PLT 240.0 01/14/2015   GLUCOSE 96 07/18/2015   CHOL 152 01/14/2015   TRIG 136.0 01/14/2015   HDL 47.30 01/14/2015   LDLCALC 78 01/14/2015   ALT 76* 05/16/2015   AST 32 05/16/2015   NA 140 07/18/2015   K 4.4  07/18/2015   CL 103 07/18/2015   CREATININE 1.28 07/18/2015   BUN 17 07/18/2015   CO2 31 07/18/2015   TSH 0.67 01/14/2015   MICROALBUR 11.9* 01/14/2015    US Abdomen Limited Ruq  02/15/2015  CLINICAL DATA:  Elevated liver enzymes EXAM: US ABDOMEN LIMITED - RIGHT UPPER QUADRANT COMPARISON:  None. FINDINGS: Gallbladder: No gallstones or wall thickening visualized. No sonographic Murphy sign noted. Common bile duct: Diameter: 4 mm Liver: Mild to moderate diffusely increased hepatic echogenicity with focal areas of lower echogenicity adjacent to the gallbladder most consistent with diffuse hepatic steatosis with regional sparing adjacent to the gallbladder fossa. No significant focal abnormalities. IMPRESSION: Hepatic steatosis. Regional sparing adjacent to the gallbladder fossa suggested by ultrasound appearance. If indicated, CT abdomen could be considered to evaluate in greater detail. Electronically Signed   By: Skipper Cliche M.D.   On: 02/15/2015 08:58    Assessment & Plan:   Problem List Items Addressed This Visit    HTN (hypertension) - Primary    Better,  but not at goal on lisinopril40/25.  Add back amlodipine 5 mg daily      Relevant Medications   amLODipine (NORVASC) 5 MG tablet   Fatty liver disease, nonalcoholic    Presumed by ultrasound changes and serologies negative for autoimmune causes of hepatitis.   all modifiable risk factors including obesity, diabetes and hyperlipidemia have been addressed.  He has deferred GI referral at this time.  He is receiving the hepatitis A/B vaccines  Lab Results  Component Value Date   ALT 76* 05/16/2015   AST 32 05/16/2015   ALKPHOS 65 05/16/2015   BILITOT 0.9 05/16/2015            Obesity    I have congratulated him in his weight loss and encouraged  Continued weight loss with goal of 10% of body weight over the next 6 months using a low glycemic index diet and regular exercise a minimum of 5 days per week.           I am  having Adam [name] maintain his albuterol, multivitamin with minerals, aspirin, traMADol, ergocalciferol, lisinopril-hydrochlorothiazide, and amLODipine.  Meds ordered this encounter  Medications  . amLODipine (NORVASC) 5 MG tablet    Sig: Take 1 tablet (5 mg total) by mouth daily.    Dispense:  90 tablet    Refill:  1   A total of 25 minutes of face to face time was spent with patient more than half of which was spent in counselling about the above mentioned conditions  and coordination of care   Medications Discontinued During This Encounter  Medication Reason  . amLODipine (NORVASC) 5 MG tablet Reorder    Follow-up: Return in about 4 weeks (around 11/14/2015) for labs,  RN vaccine Hep a/B .   [name], MD

## 2015-10-17 NOTE — Assessment & Plan Note (Addendum)
Better,  but not at goal on lisinopril40/25.  Add back amlodipine 5 mg daily

## 2015-10-17 NOTE — Patient Instructions (Addendum)
Your blood pressure is better but not yet at [place].  i would like you to Continue 2 lisinopril hct  Daily in the morning   I wolld like you to Add back the 5 mg amlodipine.  The hct should take care of the fluid retention that  It was causing before.  If it does not ,  Let me know and I will amend your regimen.  Please return in one month for fasting labs and 3rd hepatitis VACCINE

## 2015-10-17 NOTE — Progress Notes (Signed)
Pre-visit discussion using our clinic review tool. No additional management support is needed unless otherwise documented below in the visit note.  

## 2015-10-19 NOTE — Assessment & Plan Note (Signed)
Presumed by ultrasound changes and serologies negative for autoimmune causes of hepatitis.   all modifiable risk factors including obesity, diabetes and hyperlipidemia have been addressed.  He has deferred GI referral at this time.  He is receiving the hepatitis A/B vaccines  Lab Results  Component Value Date   ALT 76* 05/16/2015   AST 32 05/16/2015   ALKPHOS 65 05/16/2015   BILITOT 0.9 05/16/2015

## 2015-10-19 NOTE — Assessment & Plan Note (Signed)
I have congratulated him in his weight loss and encouraged  Continued weight loss with goal of 10% of body weight over the next 6 months using a low glycemic index diet and regular exercise a minimum of 5 days per week.

## 2015-11-15 ENCOUNTER — Ambulatory Visit: Payer: 59

## 2015-11-15 ENCOUNTER — Other Ambulatory Visit: Payer: 59

## 2015-11-15 DIAGNOSIS — G4733 Obstructive sleep apnea (adult) (pediatric): Secondary | ICD-10-CM | POA: Diagnosis not present

## 2015-11-21 NOTE — Progress Notes (Signed)
Cardiology Office Note   Date:  11/22/2015   ID:  PARAS KREIDER, DOB [birth year], MRN [id number]  PCP:  Adam Mc, MD    Chief Complaint  Patient presents with  . Dizziness  . Hypertension      History of Present Illness: Adam [name] is a 40 y.o. male who presents for evaluation of presyncope and labile HTN.  He has had episodes of visual disturbances associated with markedly elevated BPs as high as 200/16mHg.  He has been compliant with his BP meds.  He has had some problems with dizziness and lightheadedness when lifting heavy weights at the gym.  Recently it has been occurring more often and feels like he is going to pass out.  He stopped lifting and it has resolved.  He was lifting 300lbs at a time.  He has a history of OSA on CPAP and uses [employer] for his DME.  He uses a nasal pillow mask which he tolerates well but the mask has started leaking.  He has a Resmed [place] mask which has been blowing in his face causing eye irritation.  He has switched out the padding twice with no improvement.  He feels rested in the am and has no daytime sleepiness except for a rare occasion in the afternoon.  He also has a history of chest discomfort that he describes as a pin prick sensation over his left breast and left arm into his back.  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  He denies any association of diaphoresis or nausea.  His left wrist will go numb with it.  He denies any SOB or DOE, LE edema or syncope.  He has noticed some racing of his heart beat when exercising.     Past Medical History  Diagnosis Date  . Asthma     Childhood  . Hypertension 2013  . Chicken pox   . Chronic headaches   . Sleep apnea     on CPAP    Past Surgical History  Procedure Laterality Date  . No past surgeries       Current Outpatient Prescriptions  Medication Sig Dispense Refill  . albuterol (PROVENTIL HFA;VENTOLIN HFA) 108 (90 BASE) MCG/ACT inhaler Inhale  2 puffs into the lungs every 4 (four) hours as needed for wheezing. 1 Inhaler 0  . amLODipine (NORVASC) 5 MG tablet Take 1 tablet (5 mg total) by mouth daily. 90 tablet 1  . aspirin 81 MG tablet Take 81 mg by mouth daily.    .Marland Kitchenlisinopril-hydrochlorothiazide (ZESTORETIC) 20-12.5 MG tablet Take 2 tablets by mouth daily. 180 tablet 0  . Multiple Vitamins-Minerals (MULTIVITAMIN WITH MINERALS) tablet Take 1 tablet by mouth daily.    . traMADol (ULTRAM) 50 MG tablet Take 1 tablet (50 mg total) by mouth every 6 (six) hours as needed. 120 tablet 0   No current facility-administered medications for this visit.    Allergies:   Review of patient's allergies indicates no known allergies.    Social History:  The patient  reports that he has never smoked. He has never used smokeless tobacco. He reports that he does not drink alcohol or use illicit drugs.   Family History:  The patient's family history includes Arthritis in his mother; Asthma in his sister; Diabetes in his brother, father, maternal grandfather, maternal grandmother, and sister; Heart disease in his sister; Heart disease (age  of onset: 35) in his maternal grandmother; Hypertension in his brother, father, and mother; Kidney disease in his maternal grandfather; Lupus in his sister.    ROS:  Please see the history of present illness.   Otherwise, review of systems are positive for none.   All other systems are reviewed and negative.    PHYSICAL EXAM: VS:  BP 122/86 mmHg  Pulse 84  Ht 6' 3"  (1.905 m)  Wt 300 lb 12.8 oz (136.442 kg)  BMI 37.60 kg/m2 , BMI Body mass index is 37.6 kg/(m^2). GEN: Well nourished, well developed, in no acute distress HEENT: normal Neck: no JVD, carotid bruits, or masses Cardiac: RRR; no murmurs, rubs, or gallops,no edema  Respiratory:  clear to auscultation bilaterally, normal work of breathing GI: soft, nontender, nondistended, + BS MS: no deformity or atrophy Skin: warm and dry, no rash Neuro:  Strength  and sensation are intact Psych: euthymic mood, full affect   EKG:  EKG was ordered today and showed Inferior T wave changes c/w ischemia.      Recent Labs: 01/14/2015: Hemoglobin 17.0; Platelets 240.0; TSH 0.67 05/16/2015: ALT 76* 07/18/2015: BUN 17; Creatinine, Ser 1.28; Potassium 4.4; Sodium 140    Lipid Panel    Component Value Date/Time   CHOL 152 01/14/2015 1057   TRIG 136.0 01/14/2015 1057   HDL 47.30 01/14/2015 1057   CHOLHDL 3 01/14/2015 1057   VLDL 27.2 01/14/2015 1057   LDLCALC 78 01/14/2015 1057      Wt Readings from Last 3 Encounters:  11/22/15 300 lb 12.8 oz (136.442 kg)  10/17/15 299 lb 2 oz (135.682 kg)  10/12/15 303 lb 2 oz (137.497 kg)         ASSESSMENT AND PLAN:  1.  Labile HTN - BP better controlled now that a diuretic has been added.  No changes to his meds today.  I have asked him to check his BP Twice daily for a week and call with results.   2.  Presyncope with dizziness - I suspect this was vasovagal or orthostatic in nature from him lying down benchpressing > 300lbs.  This has resolved after stopping weight lifting.   3.  Chest pain - somewhat atypical in that is occurs at rest and is sharp but is associated with arm numbness.  I suspect this is due to subendocardial ischemia from poorly controlled HTN.  He says that it occurs mainly when his BP is elevated.  It also could be from cervical spine disease since it occurs mainly with driving.  His EKG shows inferior ischemia which could be from underlying LVH with repol from Hypertensive heart disease but need to rule out ischemia.  I will get a stress myoview to rule out ischemia and 2D echo to assess for LVH.   4.  OSA - he tolerates his CPAP well.  His d/l today showed an AHI of 0.3/hr on 10cm H2O and 84% compliant in using more than 4 hours nightly.  Patient has been using and benefiting from CPAP use and will continue to benefit from therapy. I will order him a Resmed Airfit P10 mask to see if that  is tolerated better.     Current medicines are reviewed at length with the patient today.  The patient does not have concerns regarding medicines.  The following changes have been made:  no change  Labs/ tests ordered today: See above Assessment and Plan No orders of the defined types were placed in this encounter.  Disposition:   FU with me in 6 months.  Lurena Nida, MD  11/22/2015 2:28 PM    [facility] [address], [place], [place]  [address] Phone: [phone]; Fax: [phone]

## 2015-11-22 ENCOUNTER — Ambulatory Visit: Payer: 59 | Admitting: Cardiology

## 2015-11-22 ENCOUNTER — Other Ambulatory Visit: Payer: Self-pay | Admitting: *Deleted

## 2015-11-22 ENCOUNTER — Ambulatory Visit (INDEPENDENT_AMBULATORY_CARE_PROVIDER_SITE_OTHER): Payer: 59 | Admitting: Cardiology

## 2015-11-22 VITALS — BP 122/86 | HR 84 | Ht 75.0 in | Wt 300.8 lb

## 2015-11-22 DIAGNOSIS — G4733 Obstructive sleep apnea (adult) (pediatric): Secondary | ICD-10-CM | POA: Diagnosis not present

## 2015-11-22 DIAGNOSIS — R0989 Other specified symptoms and signs involving the circulatory and respiratory systems: Secondary | ICD-10-CM

## 2015-11-22 DIAGNOSIS — R079 Chest pain, unspecified: Secondary | ICD-10-CM

## 2015-11-22 DIAGNOSIS — R9431 Abnormal electrocardiogram [ECG] [EKG]: Secondary | ICD-10-CM

## 2015-11-22 DIAGNOSIS — I10 Essential (primary) hypertension: Secondary | ICD-10-CM | POA: Diagnosis not present

## 2015-11-22 DIAGNOSIS — Z9989 Dependence on other enabling machines and devices: Secondary | ICD-10-CM

## 2015-11-22 DIAGNOSIS — R55 Syncope and collapse: Secondary | ICD-10-CM | POA: Diagnosis not present

## 2015-11-22 NOTE — Patient Instructions (Signed)
Medication Instructions:  Your physician recommends that you continue on your current medications as directed. Please refer to the Current Medication list given to you today.   Labwork: None  Testing/Procedures: Your physician has requested that you have an echocardiogram. Echocardiography is a painless test that uses sound waves to create images of your heart. It provides your doctor with information about the size and shape of your heart and how well your heart's chambers and valves are working. This procedure takes approximately one hour. There are no restrictions for this procedure.  Dr. Radford Pax recommends you have a NUCLEAR STRESS TEST.  Follow-Up: Your physician wants you to follow-up in: 6 months with Dr. Radford Pax. You will receive a reminder letter in the mail two months in advance. If you don't receive a letter, please call our office to schedule the follow-up appointment.   Any Other Special Instructions Will Be Listed Below (If Applicable).     If you need a refill on your cardiac medications before your next appointment, please call your pharmacy.

## 2015-12-05 ENCOUNTER — Telehealth (HOSPITAL_COMMUNITY): Payer: Self-pay | Admitting: *Deleted

## 2015-12-05 DIAGNOSIS — G4733 Obstructive sleep apnea (adult) (pediatric): Secondary | ICD-10-CM | POA: Diagnosis not present

## 2015-12-05 NOTE — Telephone Encounter (Signed)
Left message on voicemail in reference to upcoming appointment scheduled for 12/07/15. Phone number given for a call back so details instructions can be given. Adam [name], Adam [name]

## 2015-12-06 ENCOUNTER — Telehealth (HOSPITAL_COMMUNITY): Payer: Self-pay | Admitting: *Deleted

## 2015-12-06 NOTE — Telephone Encounter (Signed)
Patient given detailed instructions per Myocardial Perfusion Study Information Sheet for the test on 12/07/15 at 1230. Patient notified to arrive 15 minutes early and that it is imperative to arrive on time for appointment to keep from having the test rescheduled.  If you need to cancel or reschedule your appointment, please call the office within 24 hours of your appointment. Failure to do so may result in a cancellation of your appointment, and a $50 no show fee. Patient verbalized understanding.Teryl Mcconaghy, Ranae Palms

## 2015-12-07 ENCOUNTER — Other Ambulatory Visit: Payer: Self-pay

## 2015-12-07 ENCOUNTER — Ambulatory Visit (HOSPITAL_COMMUNITY): Payer: 59 | Attending: Cardiology

## 2015-12-07 ENCOUNTER — Ambulatory Visit (HOSPITAL_BASED_OUTPATIENT_CLINIC_OR_DEPARTMENT_OTHER): Payer: 59

## 2015-12-07 DIAGNOSIS — I119 Hypertensive heart disease without heart failure: Secondary | ICD-10-CM | POA: Insufficient documentation

## 2015-12-07 DIAGNOSIS — R9431 Abnormal electrocardiogram [ECG] [EKG]: Secondary | ICD-10-CM

## 2015-12-07 DIAGNOSIS — R079 Chest pain, unspecified: Secondary | ICD-10-CM | POA: Diagnosis not present

## 2015-12-07 DIAGNOSIS — R9439 Abnormal result of other cardiovascular function study: Secondary | ICD-10-CM | POA: Insufficient documentation

## 2015-12-07 DIAGNOSIS — R42 Dizziness and giddiness: Secondary | ICD-10-CM | POA: Diagnosis not present

## 2015-12-07 MED ORDER — TECHNETIUM TC 99M SESTAMIBI GENERIC - CARDIOLITE
33.0000 | Freq: Once | INTRAVENOUS | Status: AC | PRN
  Administered 2015-12-07: 33 via INTRAVENOUS

## 2015-12-08 ENCOUNTER — Ambulatory Visit (HOSPITAL_COMMUNITY): Payer: 59 | Attending: Cardiology

## 2015-12-08 LAB — MYOCARDIAL PERFUSION IMAGING
CHL CUP MPHR: 181 {beats}/min
CHL CUP NUCLEAR SDS: 0
CHL CUP NUCLEAR SSS: 3
CSEPEW: 11.3 METS
CSEPHR: 99 %
Exercise duration (min): 9 min
Exercise duration (sec): 46 s
LHR: 0.33
LV dias vol: 112 mL (ref 62–150)
LV sys vol: 51 mL
Peak HR: 179 {beats}/min
RPE: 18
Rest HR: 97 {beats}/min
SRS: 3
TID: 0.76

## 2015-12-08 MED ORDER — TECHNETIUM TC 99M SESTAMIBI GENERIC - CARDIOLITE
32.8000 | Freq: Once | INTRAVENOUS | Status: AC | PRN
  Administered 2015-12-08: 32.8 via INTRAVENOUS

## 2016-01-08 ENCOUNTER — Other Ambulatory Visit: Payer: Self-pay | Admitting: Family Medicine

## 2016-01-17 DIAGNOSIS — G4733 Obstructive sleep apnea (adult) (pediatric): Secondary | ICD-10-CM | POA: Diagnosis not present

## 2016-02-13 ENCOUNTER — Other Ambulatory Visit: Payer: Self-pay | Admitting: Family Medicine

## 2016-02-13 ENCOUNTER — Telehealth: Payer: Self-pay | Admitting: *Deleted

## 2016-02-13 MED ORDER — AMLODIPINE BESYLATE 5 MG PO TABS: 5.0000 mg | ORAL_TABLET | Freq: Every day | ORAL | Status: DC

## 2016-02-13 NOTE — Telephone Encounter (Signed)
Patient has requested to have his amlodpine refilled, he requested a 90 day supply He requested to use [facility] on [address].

## 2016-02-13 NOTE — Telephone Encounter (Signed)
[facility] refill sent to [facility].  Patient needs appointment for further refills.

## 2016-04-18 IMAGING — US US ABDOMEN LIMITED
1 series · 14 of 25 positions shown · non-contrast
Comparison: None.

CLINICAL DATA: Elevated liver enzymes

EXAM:
US ABDOMEN LIMITED - RIGHT UPPER QUADRANT

[Series 1: us abdomen limited · 0.28mm/px · 14 of 87 slices shown]
[im 1/87]
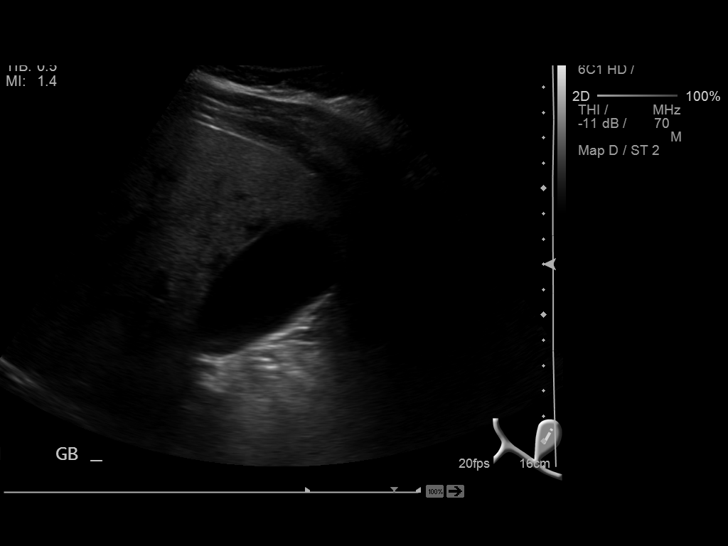
[im 8/87]
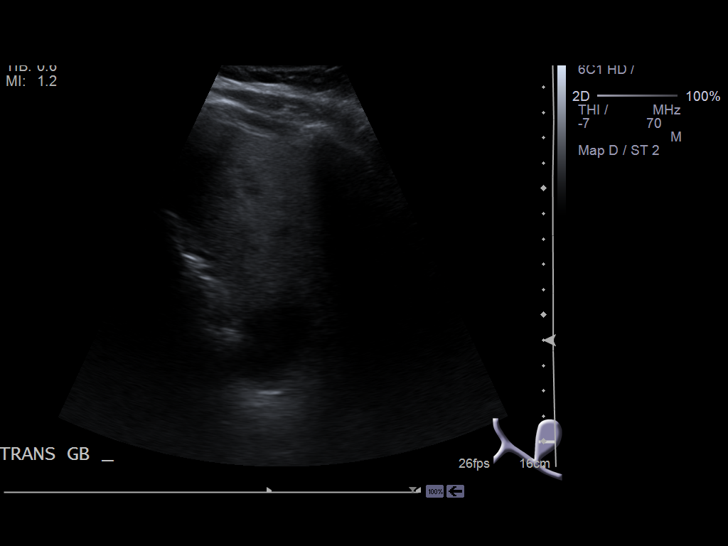
[im 15/87]
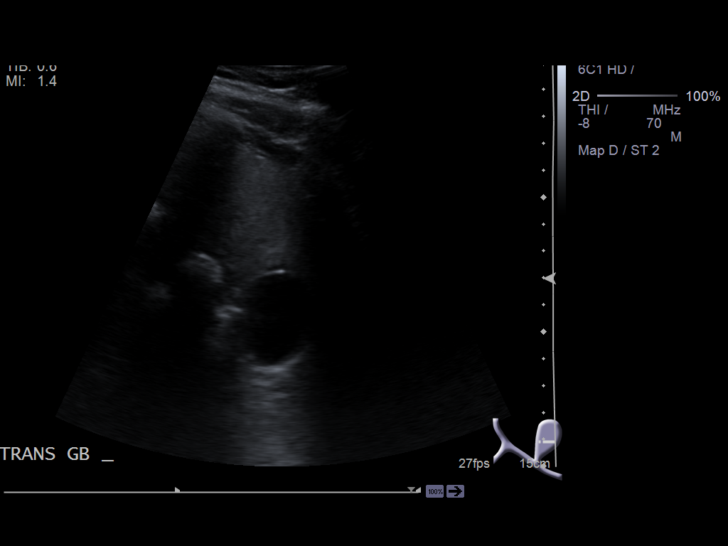
[im 22/87]
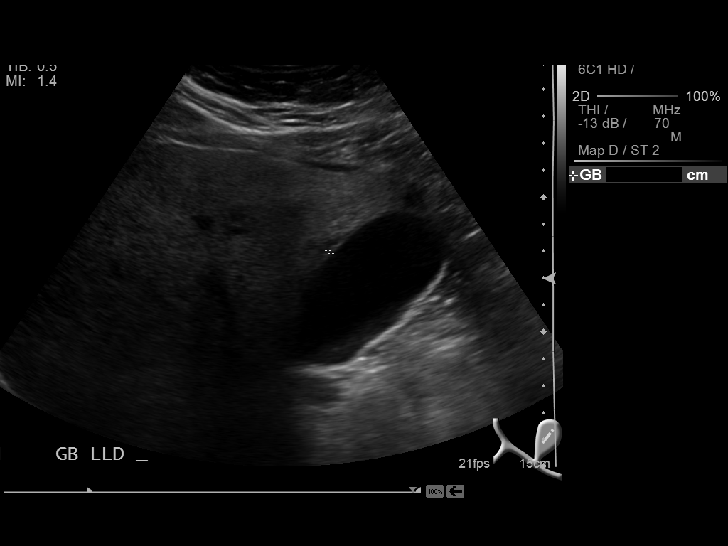
[im 29/87]
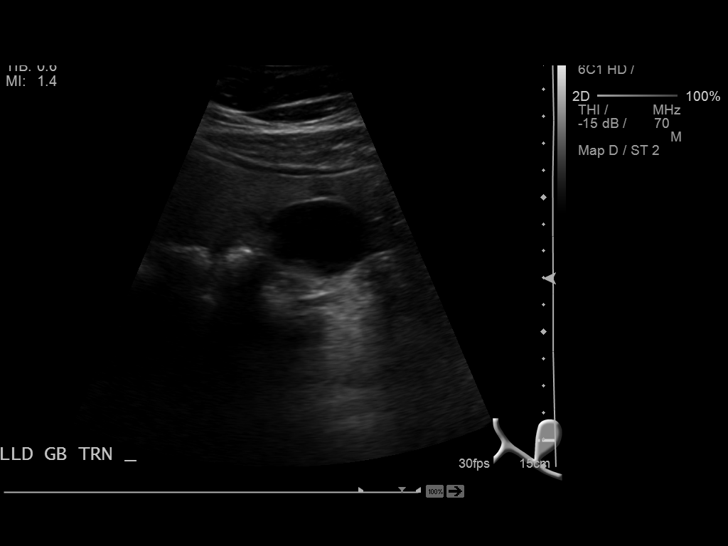
[im 33/87]
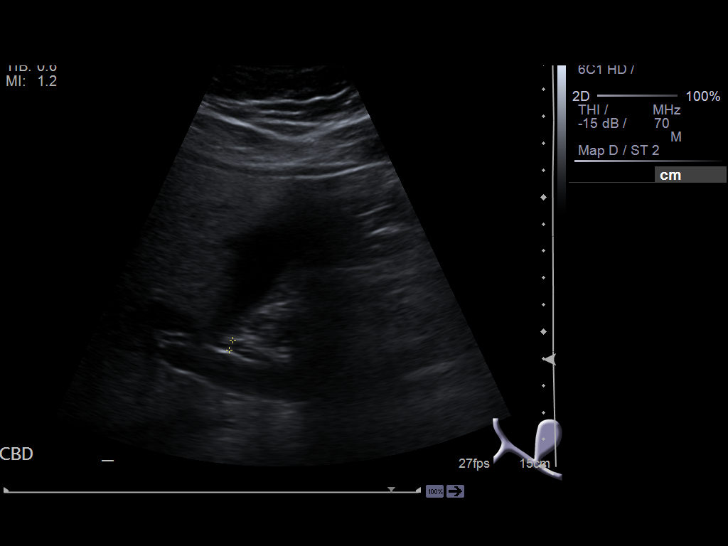
[im 40/87]
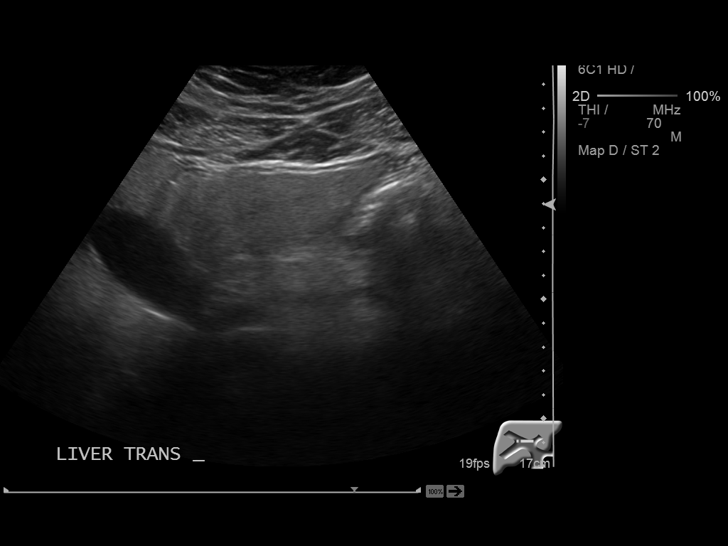
[im 47/87]
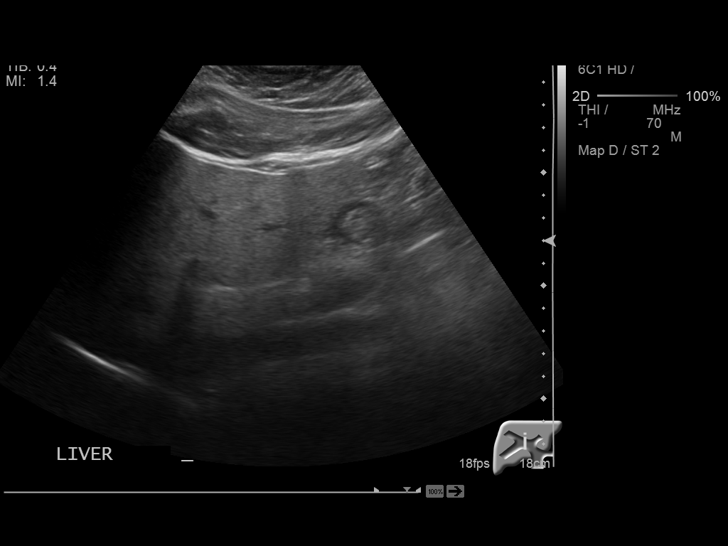
[im 54/87]
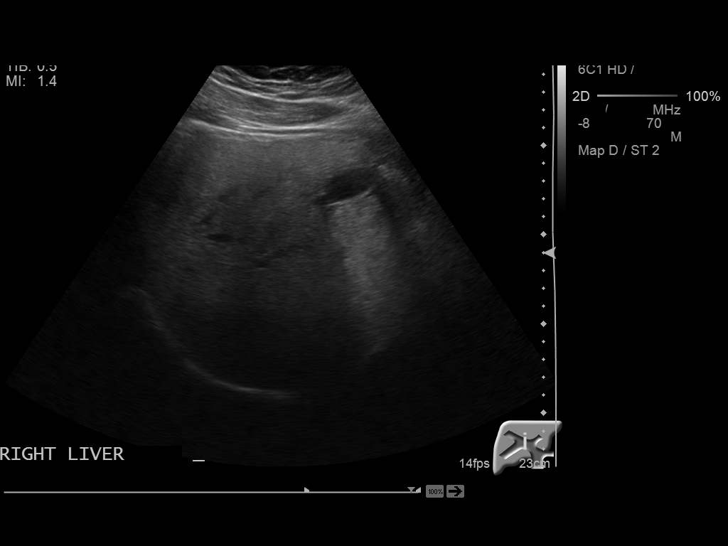
[im 58/87]
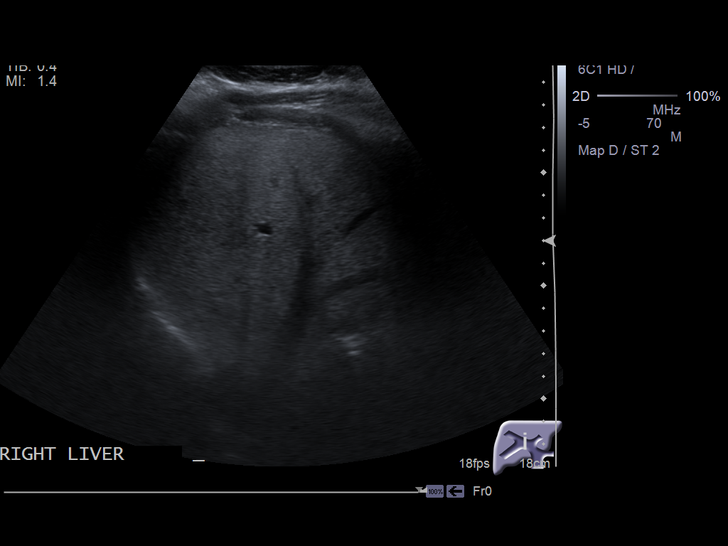
[im 65/87]
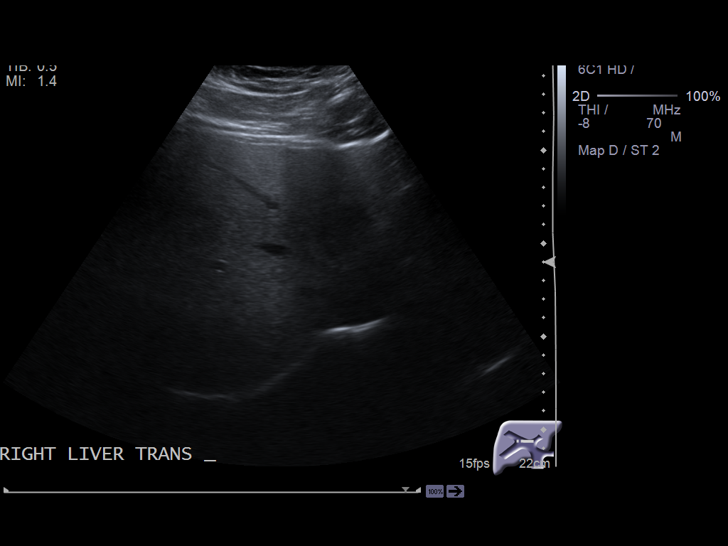
[im 72/87]
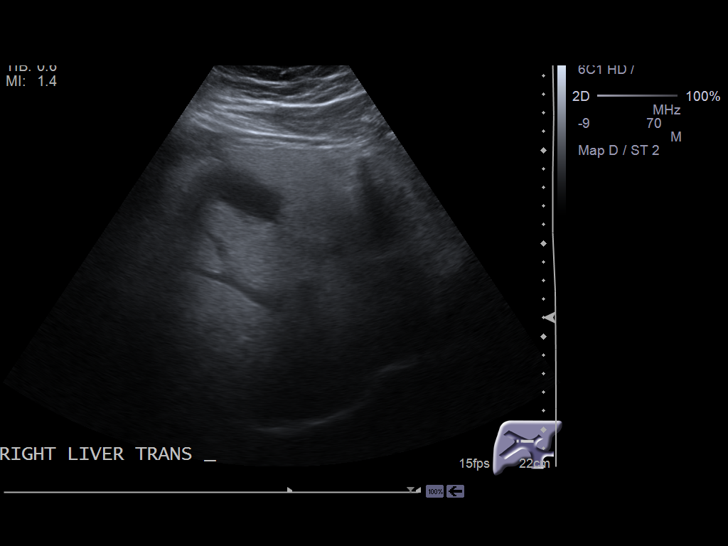
[im 79/87]
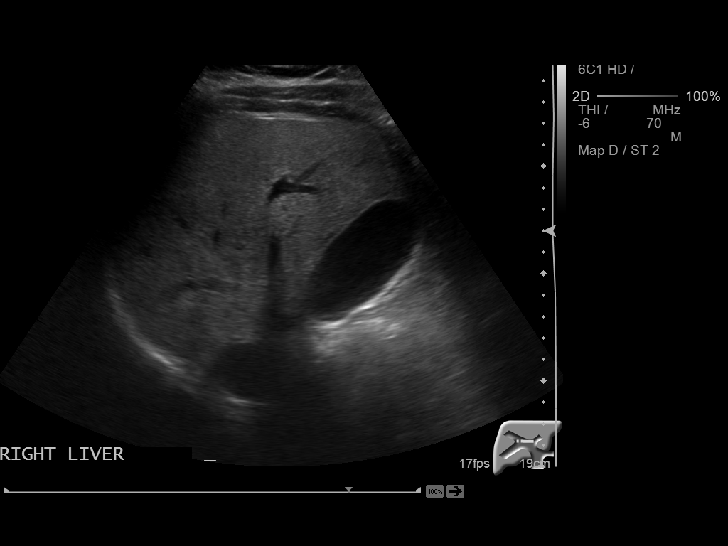
[im 87/87]
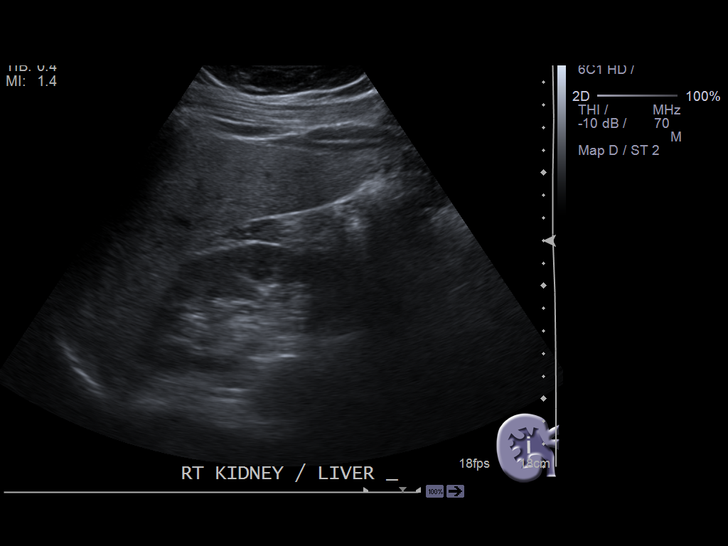

[14 of 25 positions shown; findings below may reference images not displayed]

FINDINGS: Gallbladder:

No gallstones or wall thickening visualized. No sonographic Murphy
sign noted.

Common bile duct:

Diameter: 4 mm

Liver:

Mild to moderate diffusely increased hepatic echogenicity with focal
areas of lower echogenicity adjacent to the gallbladder most
consistent with diffuse hepatic steatosis with regional sparing
adjacent to the gallbladder fossa. No significant focal
abnormalities.
IMPRESSION: Hepatic steatosis. Regional sparing adjacent to the gallbladder
fossa suggested by ultrasound appearance. If indicated, CT abdomen
could be considered to evaluate in greater detail.

## 2016-05-02 DIAGNOSIS — H0259 Other disorders affecting eyelid function: Secondary | ICD-10-CM | POA: Diagnosis not present

## 2016-05-21 ENCOUNTER — Ambulatory Visit (INDEPENDENT_AMBULATORY_CARE_PROVIDER_SITE_OTHER): Payer: 59 | Admitting: Internal Medicine

## 2016-05-21 ENCOUNTER — Encounter (INDEPENDENT_AMBULATORY_CARE_PROVIDER_SITE_OTHER): Payer: Self-pay

## 2016-05-21 ENCOUNTER — Encounter: Payer: Self-pay | Admitting: Internal Medicine

## 2016-05-21 VITALS — BP 122/82 | HR 81 | Temp 98.2°F | Resp 12 | Ht 75.0 in | Wt 303.8 lb

## 2016-05-21 DIAGNOSIS — E669 Obesity, unspecified: Secondary | ICD-10-CM | POA: Diagnosis not present

## 2016-05-21 DIAGNOSIS — E785 Hyperlipidemia, unspecified: Secondary | ICD-10-CM | POA: Diagnosis not present

## 2016-05-21 DIAGNOSIS — R55 Syncope and collapse: Secondary | ICD-10-CM | POA: Diagnosis not present

## 2016-05-21 DIAGNOSIS — Z9989 Dependence on other enabling machines and devices: Secondary | ICD-10-CM

## 2016-05-21 DIAGNOSIS — I10 Essential (primary) hypertension: Secondary | ICD-10-CM

## 2016-05-21 DIAGNOSIS — G4733 Obstructive sleep apnea (adult) (pediatric): Secondary | ICD-10-CM

## 2016-05-21 MED ORDER — CARVEDILOL 6.25 MG PO TABS: 6.2500 mg | ORAL_TABLET | Freq: Two times a day (BID) | ORAL | 3 refills | Status: DC

## 2016-05-21 MED ORDER — LISINOPRIL-HYDROCHLOROTHIAZIDE 20-12.5 MG PO TABS: 2.0000 | ORAL_TABLET | Freq: Every day | ORAL | 3 refills | Status: DC

## 2016-05-21 NOTE — Progress Notes (Signed)
Subjective:  Patient ID: Adam [name], male    DOB: 08-09-1976  Age: 40 y.o. MRN: [id number]  CC: The primary encounter diagnosis was Essential hypertension. Diagnoses of Hyperlipidemia, Obesity, Pre-syncope, and OSA on CPAP were also pertinent to this visit.apnea .  HPI Adam [name] presents for follow up on hypertension , obesity with sleep  Apnea  and fatty liver.   Discussed diet and exercise.  He gets dizzy with sudden changes in position .  Has affected his workouts as well, .  situps and weights with squats did the same thing    Fasting labs today  Last meal  1 pm   Tramadol causing nausea and sedating .  Not using , no pain currently   Using CPAP every night,  Cannot sleep without it . Even naps during the day     This SmartLink has not been configured with any valid records.    Outpatient Medications Prior to Visit  Medication Sig Dispense Refill  . albuterol (PROVENTIL HFA;VENTOLIN HFA) 108 (90 BASE) MCG/ACT inhaler Inhale 2 puffs into the lungs every 4 (four) hours as needed for wheezing. 1 Inhaler 0  . aspirin 81 MG tablet Take 81 mg by mouth daily.    . Multiple Vitamins-Minerals (MULTIVITAMIN WITH MINERALS) tablet Take 1 tablet by mouth daily.    Marland Kitchen amLODipine (NORVASC) 5 MG tablet Take 1 tablet (5 mg total) by mouth daily. 90 tablet 0  . lisinopril-hydrochlorothiazide (PRINZIDE,ZESTORETIC) 20-12.5 MG tablet TAKE TWO TABLETS BY MOUTH ONCE DAILY 180 tablet 3  . traMADol (ULTRAM) 50 MG tablet Take 1 tablet (50 mg total) by mouth every 6 (six) hours as needed. (Patient not taking: Reported on 05/21/2016) 120 tablet 0   No facility-administered medications prior to visit.     Review of Systems;  Patient denies headache, fevers, malaise, unintentional weight loss, skin rash, eye pain, sinus congestion and sinus pain, sore throat, dysphagia,  hemoptysis , cough, dyspnea, wheezing, chest pain, palpitations, orthopnea, edema, abdominal pain, nausea, melena, diarrhea,  constipation, flank pain, dysuria, hematuria, urinary  Frequency, nocturia, numbness, tingling, seizures,  Focal weakness, Loss of consciousness,  Tremor, insomnia, depression, anxiety, and suicidal ideation.      Objective:  BP 122/82   Pulse 81   Temp 98.2 F (36.8 C) (Oral)   Resp 12   Ht 6' 3"  (1.905 m)   Wt (!) 303 lb 12 oz (137.8 kg)   SpO2 97%   BMI 37.97 kg/m   BP Readings from Last 3 Encounters:  05/21/16 122/82  11/22/15 122/86  10/17/15 (!) 138/95    Wt Readings from Last 3 Encounters:  05/21/16 (!) 303 lb 12 oz (137.8 kg)  12/07/15 300 lb (136.1 kg)  11/22/15 (!) 300 lb 12.8 oz (136.4 kg)    General appearance: alert, cooperative and appears stated age Ears: normal TM's and external ear canals both ears Throat: lips, mucosa, and tongue normal; teeth and gums normal Neck: no adenopathy, no carotid bruit, supple, symmetrical, trachea midline and thyroid not enlarged, symmetric, no tenderness/mass/nodules Back: symmetric, no curvature. ROM normal. No CVA tenderness. Lungs: clear to auscultation bilaterally Heart: regular rate and rhythm, S1, S2 normal, no murmur, click, rub or gallop Abdomen: soft, non-tender; bowel sounds normal; no masses,  no organomegaly Pulses: 2+ and symmetric Skin: Skin color, texture, turgor normal. No rashes or lesions Lymph nodes: Cervical, supraclavicular, and axillary nodes normal.  Lab Results  Component Value Date   HGBA1C 5.7 05/21/2016    Lab  Results  Component Value Date   CREATININE 1.37 05/21/2016   CREATININE 1.28 07/18/2015   CREATININE 1.19 06/22/2015    Lab Results  Component Value Date   WBC 7.6 01/14/2015   HGB 17.0 01/14/2015   HCT 50.9 01/14/2015   PLT 240.0 01/14/2015   GLUCOSE 76 05/21/2016   CHOL 170 05/21/2016   TRIG 147.0 05/21/2016   HDL 45.00 05/21/2016   LDLCALC 95 05/21/2016   ALT 106 (H) 05/21/2016   AST 46 (H) 05/21/2016   NA 139 05/21/2016   K 3.6 05/21/2016   CL 101 05/21/2016    CREATININE 1.37 05/21/2016   BUN 22 05/21/2016   CO2 32 05/21/2016   TSH 0.67 01/14/2015   HGBA1C 5.7 05/21/2016   MICROALBUR 11.9 (H) 01/14/2015    US Abdomen Limited Ruq  Result Date: 02/15/2015 CLINICAL DATA:  Elevated liver enzymes EXAM: US ABDOMEN LIMITED - RIGHT UPPER QUADRANT COMPARISON:  None. FINDINGS: Gallbladder: No gallstones or wall thickening visualized. No sonographic Murphy sign noted. Common bile duct: Diameter: 4 mm Liver: Mild to moderate diffusely increased hepatic echogenicity with focal areas of lower echogenicity adjacent to the gallbladder most consistent with diffuse hepatic steatosis with regional sparing adjacent to the gallbladder fossa. No significant focal abnormalities. IMPRESSION: Hepatic steatosis. Regional sparing adjacent to the gallbladder fossa suggested by ultrasound appearance. If indicated, CT abdomen could be considered to evaluate in greater detail. Electronically Signed   By: Skipper Cliche M.D.   On: 02/15/2015 08:58    Assessment & Plan:   Problem List Items Addressed This Visit    OSA on CPAP    Diagnosed by prior sleep study. Patient is using CPAP every night a minimum of 6 hours per night and notes improved daytime wakefulness and decreased fatigue       Obesity    I have addressed  BMI and recommended a low glycemic index diet utilizing smaller more frequent meals to increase metabolism.  I have also recommended that patient start exercising with a goal of 30 minutes of aerobic exercise a minimum of 5 days per week.      Relevant Orders   Hemoglobin A1c (Completed)   Pre-syncope    Secondary to orthostasis ,  Advised to suspend prinizide to after his workouts.       Relevant Medications   carvedilol (COREG) 6.25 MG tablet   lisinopril-hydrochlorothiazide (PRINZIDE,ZESTORETIC) 20-12.5 MG tablet   HTN (hypertension) - Primary   Relevant Medications   carvedilol (COREG) 6.25 MG tablet   lisinopril-hydrochlorothiazide  (PRINZIDE,ZESTORETIC) 20-12.5 MG tablet   Other Relevant Orders   Comprehensive metabolic panel (Completed)    Other Visit Diagnoses    Hyperlipidemia       Relevant Medications   carvedilol (COREG) 6.25 MG tablet   lisinopril-hydrochlorothiazide (PRINZIDE,ZESTORETIC) 20-12.5 MG tablet   Other Relevant Orders   Lipid panel (Completed)      I have discontinued Mr. Wehner amLODipine. I have also changed his lisinopril-hydrochlorothiazide. Additionally, I am having him start on carvedilol. Lastly, I am having him maintain his albuterol, multivitamin with minerals, aspirin, and traMADol.  Meds ordered this encounter  Medications  . carvedilol (COREG) 6.25 MG tablet    Sig: Take 1 tablet (6.25 mg total) by mouth 2 (two) times daily with a meal.    Dispense:  180 tablet    Refill:  3  . lisinopril-hydrochlorothiazide (PRINZIDE,ZESTORETIC) 20-12.5 MG tablet    Sig: Take 2 tablets by mouth daily.    Dispense:  180 tablet  Refill:  3    Medications Discontinued During This Encounter  Medication Reason  . amLODipine (NORVASC) 5 MG tablet   . lisinopril-hydrochlorothiazide (PRINZIDE,ZESTORETIC) 20-12.5 MG tablet Reorder    Follow-up: No Follow-up on file.   [name], MD

## 2016-05-21 NOTE — Progress Notes (Signed)
Pre-visit discussion using our clinic review tool. No additional management support is needed unless otherwise documented below in the visit note.  

## 2016-05-21 NOTE — Patient Instructions (Addendum)
Postpone taking your lisinopril hctz until after your workouts.    We are changing your blood pressure called amlodipine to carvedilol , for cost savings $$$$ .  Taken twice daily   Continue lisinopril/hctz 2 pills daily   Both blood pressure medications have been   sent to [facility] with 90 days supply ;   both should be on  The $4 drug plan   Losing 100 lbs may wipe out your sleep apnea  Eat more frequently,  (6 meals daily)  All non starccy vegetables make great snacks,  All berry/cherry fruits as well

## 2016-05-22 LAB — COMPREHENSIVE METABOLIC PANEL
ALBUMIN: 4.4 g/dL (ref 3.5–5.2)
ALT: 106 U/L — AB (ref 0–53)
AST: 46 U/L — ABNORMAL HIGH (ref 0–37)
Alkaline Phosphatase: 51 U/L (ref 39–117)
BILIRUBIN TOTAL: 0.8 mg/dL (ref 0.2–1.2)
BUN: 22 mg/dL (ref 6–23)
CALCIUM: 10 mg/dL (ref 8.4–10.5)
CHLORIDE: 101 meq/L (ref 96–112)
CO2: 32 meq/L (ref 19–32)
Creatinine, Ser: 1.37 mg/dL (ref 0.40–1.50)
GFR: 73.84 mL/min (ref >60.00)
Glucose, Bld: 76 mg/dL (ref 70–99)
Potassium: 3.6 mEq/L (ref 3.5–5.1)
Sodium: 139 mEq/L (ref 135–145)
Total Protein: 7.8 g/dL (ref 6.0–8.3)

## 2016-05-22 LAB — LIPID PANEL
CHOL/HDL RATIO: 4
CHOLESTEROL: 170 mg/dL (ref 0–200)
HDL: 45 mg/dL (ref >39.00)
LDL Cholesterol: 95 mg/dL (ref 0–99)
NonHDL: 124.82
TRIGLYCERIDES: 147 mg/dL (ref 0.0–149.0)
VLDL: 29.4 mg/dL (ref 0.0–40.0)

## 2016-05-22 LAB — HEMOGLOBIN A1C: Hgb A1c MFr Bld: 5.7 % (ref 4.6–6.5)

## 2016-05-22 NOTE — Assessment & Plan Note (Signed)
I have addressed  BMI and recommended a low glycemic index diet utilizing smaller more frequent meals to increase metabolism.  I have also recommended that patient start exercising with a goal of 30 minutes of aerobic exercise a minimum of 5 days per week.  

## 2016-05-22 NOTE — Assessment & Plan Note (Signed)
Secondary to orthostasis ,  Advised to suspend prinizide to after his workouts.

## 2016-05-22 NOTE — Assessment & Plan Note (Signed)
Diagnosed by prior sleep study. Patient is using CPAP every night a minimum of 6 hours per night and notes improved daytime wakefulness and decreased fatigue

## 2016-05-25 ENCOUNTER — Encounter: Payer: Self-pay | Admitting: *Deleted

## 2017-01-14 ENCOUNTER — Other Ambulatory Visit: Payer: Self-pay | Admitting: Family Medicine

## 2017-01-14 NOTE — Telephone Encounter (Signed)
Patient of Dr.Tullo 

## 2017-04-11 NOTE — Progress Notes (Signed)
Cardiology Office Note:    Date:  04/12/2017   ID:  Adam [name], DOB 29-Mar-1976, MRN [id number]  PCP:  [name], MD  Cardiologist:  Fransico Him, MD    Referring MD: [name], MD   Chief Complaint  Patient presents with  . Sleep Apnea  . Hypertension    History of Present Illness:    Adam [name] is a 41 y.o. male with a hx of presyncope, OSA and labile HTN.  He has a history of OSA on CPAP and uses [facility] for his DME and needs [name] supplies.  He uses 2 different masks, a nasal pillow mask and a nasal mask which he switches out. He feels the pressure is adequate.    He continues to feel rested in the am and has no significant daytime sleepiness except for a rare occasion in the afternoon when he may nap.  He does not snore with the CPAP.  He denies any dry mouth.  Stress test a year ago for atypical CP showed no ischemia and echo showed nornal LVF.  He is here today for followup and is doing well.   He denies any chest pain or pressure, SOB, DOE, PND, orthopnea, LE edema, palpitations, dizziness or syncope.    Past Medical History:  Diagnosis Date  . Asthma    Childhood  . Chicken pox   . Chronic headaches   . Hypertension 2013  . Sleep apnea    on CPAP    Past Surgical History:  Procedure Laterality Date  . NO PAST SURGERIES      Current Medications: Current Meds  Medication Sig  . albuterol (PROVENTIL HFA;VENTOLIN HFA) 108 (90 BASE) MCG/ACT inhaler Inhale 2 puffs into the lungs every 4 (four) hours as needed for wheezing.  Marland Kitchen aspirin 81 MG tablet Take 81 mg by mouth daily.  . carvedilol (COREG) 6.25 MG tablet Take 1 tablet (6.25 mg total) by mouth 2 (two) times daily with a meal.  . lisinopril-hydrochlorothiazide (PRINZIDE,ZESTORETIC) 20-12.5 MG tablet Take 2 tablets by mouth daily.  . Multiple Vitamins-Minerals (MULTIVITAMIN WITH MINERALS) tablet Take 1 tablet by mouth daily.     Allergies:   Patient has no known allergies.   Social History     Social History  . Marital status: Married    Spouse name: Lattie Haw  . Number of children: 3  . Years of education: 12   Occupational History  . Business Therapist, music Employed    Owns [employer] History Main Topics  . Smoking status: Never Smoker  . Smokeless tobacco: Never Used  . Alcohol use No  . Drug use: No  . Sexual activity: Yes   Other Topics Concern  . None   Social History Narrative  . None     Family History: The patient's family history includes Arthritis in his mother; Asthma in his sister; Diabetes in his brother, father, maternal grandfather, maternal grandmother, and sister; Heart disease in his sister; Heart disease (age of onset: 3) in his maternal grandmother; Hypertension in his brother, father, and mother; Kidney disease in his maternal grandfather; Lupus in his sister. ROS:   Please see the history of present illness.     All other systems reviewed and are negative.  EKGs/Labs/Other Studies Reviewed:    The following studies were reviewed today: CPAP download  EKG:  EKG is not  ordered today.    Recent Labs: 05/21/2016: ALT 106; BUN 22; Creatinine, Ser  1.37; Potassium 3.6; Sodium 139  Recent Lipid Panel    Component Value Date/Time   CHOL 170 05/21/2016 1720   TRIG 147.0 05/21/2016 1720   HDL 45.00 05/21/2016 1720   CHOLHDL 4 05/21/2016 1720   VLDL 29.4 05/21/2016 1720   LDLCALC 95 05/21/2016 1720    Physical Exam:    VS:  BP 140/82   Pulse (!) 57   Ht 6' 3"  (1.905 m)   Wt (!) 313 lb (142 kg)   SpO2 95%   BMI 39.12 kg/m     Wt Readings from Last 3 Encounters:  04/12/17 (!) 313 lb (142 kg)  05/21/16 (!) 303 lb 12 oz (137.8 kg)  12/07/15 300 lb (136.1 kg)     GEN:  Well nourished, well developed in no acute distress HEENT: Normal NECK: No JVD; No carotid bruits LYMPHATICS: No lymphadenopathy CARDIAC: RRR, no murmurs, rubs, gallops RESPIRATORY:  Clear to auscultation without rales, wheezing or rhonchi   ABDOMEN: Soft, non-tender, non-distended MUSCULOSKELETAL:  No edema; No deformity  SKIN: Warm and dry NEUROLOGIC:  Alert and oriented x 3 PSYCHIATRIC:  Normal affect   ASSESSMENT:    1. OSA on CPAP   2. Essential hypertension   3. Class 2 severe obesity due to excess calories with serious comorbidity and body mass index (BMI) of 39.0 to 39.9 in adult [facility])    PLAN:    In order of problems listed above:  OSA - the patient is tolerating PAP therapy well without any problems.  The patient has been using and benefiting from CPAP use and will continue to benefit from therapy. I will get a download from the DME and order [name] supplies.   2.   HTN - his BP is well controlled on exam today. He will continue on Lisinopril HCT 20-12.4m daily and carvedilol 6.246mBID  3.   Obesity - I have encouraged him to get into a routine exercise program and cut back on carbs and portions. .   Medication Adjustments/Labs and Tests Ordered: Current medicines are reviewed at length with the patient today.  Concerns regarding medicines are outlined above.  No orders of the defined types were placed in this encounter.  No orders of the defined types were placed in this encounter.   Signed, TrFransico HimMD  04/12/2017 8:24 AM    [place]

## 2017-04-12 ENCOUNTER — Ambulatory Visit (INDEPENDENT_AMBULATORY_CARE_PROVIDER_SITE_OTHER): Payer: 59 | Admitting: Cardiology

## 2017-04-12 ENCOUNTER — Telehealth: Payer: Self-pay | Admitting: *Deleted

## 2017-04-12 ENCOUNTER — Encounter: Payer: Self-pay | Admitting: Cardiology

## 2017-04-12 VITALS — BP 140/82 | HR 57 | Ht 75.0 in | Wt 313.0 lb

## 2017-04-12 DIAGNOSIS — Z9989 Dependence on other enabling machines and devices: Secondary | ICD-10-CM

## 2017-04-12 DIAGNOSIS — I10 Essential (primary) hypertension: Secondary | ICD-10-CM

## 2017-04-12 DIAGNOSIS — G4733 Obstructive sleep apnea (adult) (pediatric): Secondary | ICD-10-CM

## 2017-04-12 DIAGNOSIS — Z6839 Body mass index (BMI) 39.0-39.9, adult: Secondary | ICD-10-CM | POA: Diagnosis not present

## 2017-04-12 NOTE — Telephone Encounter (Signed)
Per Dr Radford Pax CPAP supplies ordered.

## 2017-04-12 NOTE — Patient Instructions (Signed)
Medication Instructions:  None  Labwork: None  Testing/Procedures: None  Follow-Up: Your physician wants you to follow-up in: 1 year with Dr. Radford Pax. You will receive a reminder letter in the mail two months in advance. If you don't receive a letter, please call our office to schedule the follow-up appointment.   Any Other Special Instructions Will Be Listed Below (If Applicable).    If you need a refill on your cardiac medications before your next appointment, please call your pharmacy.

## 2017-04-17 ENCOUNTER — Telehealth: Payer: Self-pay | Admitting: *Deleted

## 2017-04-17 NOTE — Telephone Encounter (Signed)
Informed patient of compliance results and patient understanding was verbalized. Patient understands his settings will not change. Patient was grateful and thanked me for calling.

## 2017-04-17 NOTE — Telephone Encounter (Signed)
Confirmation of fax received by [facility] Asencion Partridge).

## 2017-04-17 NOTE — Telephone Encounter (Signed)
-----   Message from Sueanne Margarita, MD sent at 04/15/2017  7:54 PM EDT ----- Good AHI and compliance.  Continue current CPAP settings.

## 2017-04-19 DIAGNOSIS — G4733 Obstructive sleep apnea (adult) (pediatric): Secondary | ICD-10-CM | POA: Diagnosis not present

## 2017-06-02 ENCOUNTER — Other Ambulatory Visit: Payer: Self-pay | Admitting: Internal Medicine

## 2017-07-12 ENCOUNTER — Other Ambulatory Visit: Payer: Self-pay | Admitting: Internal Medicine

## 2017-08-21 ENCOUNTER — Ambulatory Visit (INDEPENDENT_AMBULATORY_CARE_PROVIDER_SITE_OTHER): Payer: 59 | Admitting: Internal Medicine

## 2017-08-21 ENCOUNTER — Encounter: Payer: Self-pay | Admitting: Internal Medicine

## 2017-08-21 VITALS — BP 122/78 | HR 73 | Temp 98.0°F | Resp 15 | Ht 75.0 in | Wt 316.8 lb

## 2017-08-21 DIAGNOSIS — Z125 Encounter for screening for malignant neoplasm of prostate: Secondary | ICD-10-CM

## 2017-08-21 DIAGNOSIS — I10 Essential (primary) hypertension: Secondary | ICD-10-CM | POA: Diagnosis not present

## 2017-08-21 DIAGNOSIS — Z9989 Dependence on other enabling machines and devices: Secondary | ICD-10-CM

## 2017-08-21 DIAGNOSIS — G4733 Obstructive sleep apnea (adult) (pediatric): Secondary | ICD-10-CM | POA: Diagnosis not present

## 2017-08-21 DIAGNOSIS — R7303 Prediabetes: Secondary | ICD-10-CM | POA: Diagnosis not present

## 2017-08-21 DIAGNOSIS — R7301 Impaired fasting glucose: Secondary | ICD-10-CM

## 2017-08-21 DIAGNOSIS — Z0000 Encounter for general adult medical examination without abnormal findings: Secondary | ICD-10-CM | POA: Diagnosis not present

## 2017-08-21 DIAGNOSIS — K760 Fatty (change of) liver, not elsewhere classified: Secondary | ICD-10-CM | POA: Diagnosis not present

## 2017-08-21 DIAGNOSIS — Z6839 Body mass index (BMI) 39.0-39.9, adult: Secondary | ICD-10-CM | POA: Diagnosis not present

## 2017-08-21 LAB — LIPID PANEL
CHOLESTEROL: 170 mg/dL (ref 0–200)
HDL: 44.6 mg/dL (ref >39.00)
LDL Cholesterol: 97 mg/dL (ref 0–99)
NonHDL: 125.43
Total CHOL/HDL Ratio: 4
Triglycerides: 140 mg/dL (ref 0.0–149.0)
VLDL: 28 mg/dL (ref 0.0–40.0)

## 2017-08-21 LAB — COMPREHENSIVE METABOLIC PANEL
ALBUMIN: 4.5 g/dL (ref 3.5–5.2)
ALK PHOS: 58 U/L (ref 39–117)
ALT: 98 U/L — ABNORMAL HIGH (ref 0–53)
AST: 49 U/L — ABNORMAL HIGH (ref 0–37)
BUN: 21 mg/dL (ref 6–23)
CALCIUM: 10.2 mg/dL (ref 8.4–10.5)
CO2: 32 mEq/L (ref 19–32)
Chloride: 99 mEq/L (ref 96–112)
Creatinine, Ser: 1.3 mg/dL (ref 0.40–1.50)
GFR: 77.96 mL/min (ref >60.00)
Glucose, Bld: 90 mg/dL (ref 70–99)
POTASSIUM: 4 meq/L (ref 3.5–5.1)
Sodium: 138 mEq/L (ref 135–145)
Total Bilirubin: 1.5 mg/dL — ABNORMAL HIGH (ref 0.2–1.2)
Total Protein: 7.6 g/dL (ref 6.0–8.3)

## 2017-08-21 LAB — MICROALBUMIN / CREATININE URINE RATIO
CREATININE, U: 237.5 mg/dL
MICROALB UR: 1.6 mg/dL (ref 0.0–1.9)
MICROALB/CREAT RATIO: 0.7 mg/g (ref 0.0–30.0)

## 2017-08-21 LAB — PSA: PSA: 0.59 ng/mL (ref 0.10–4.00)

## 2017-08-21 LAB — HEMOGLOBIN A1C: Hgb A1c MFr Bld: 5.9 % (ref 4.6–6.5)

## 2017-08-21 NOTE — Progress Notes (Signed)
Patient ID: Adam [name], male    DOB: 1976/08/19  Age: 41 y.o. MRN: [id number]  The patient is here for annual preventive  examination and management of other chronic and acute problems.  Last seen Aug 2017 for hypertension follow up     The risk factors are reflected in the social history.  The roster of all physicians providing medical care to patient - is listed in the Snapshot section of the chart.  Activities of daily living:  The patient is 100% independent in all ADLs: dressing, toileting, feeding as well as independent mobility  Home safety : The patient has smoke detectors in the home. They wear seatbelts.  There are no firearms at home. There is no violence in the home.   There is no risks for hepatitis, STDs or HIV. There is no   history of blood transfusion. They have no travel history to infectious disease endemic areas of the world.  The patient has seen their dentist in the last six month. They have seen their eye doctor in the last year. They admit to slight hearing difficulty with regard to whispered voices and some television programs.  They have deferred audiologic testing in the last year.  They do not  have excessive sun exposure. Discussed the need for sun protection: hats, long sleeves and use of sunscreen if there is significant sun exposure.   Diet: the importance of a healthy diet is discussed. They do have a healthy diet.  The benefits of regular aerobic exercise were discussed.  Depression screen: there are no signs or vegative symptoms of depression- irritability, change in appetite, anhedonia, sadness/tearfullness.   The following portions of the patient's history were reviewed and updated as appropriate: allergies, current medications, past family history, past medical history,  past surgical history, past social history  and problem list.  Visual acuity was not assessed per patient preference since Adam [name] has regular follow up with her ophthalmologist. Hearing  and body mass index were assessed and reviewed.   During the course of the visit the patient was educated and counseled about appropriate screening and preventive services including : fall prevention , diabetes screening, nutrition counseling, colorectal cancer screening, and recommended immunizations.    CC: The primary encounter diagnosis was Prostate cancer screening. Diagnoses of Fatty liver disease, nonalcoholic, Essential hypertension, Impaired fasting glucose, Class 2 severe obesity due to excess calories with serious comorbidity and body mass index (BMI) of 39.0 to 39.9 in adult ([place]), OSA on CPAP, Encounter for preventive health examination, and Prediabetes were also pertinent to this visit.   Obesity ":: BMI unchanged 39.2    OSA:  Adam [name] is Wearing CPAP nightly  Even with naps.    compliance report reviewed.       Some knee pain the day after with running on pavement and Treadmill. And after playing basketball. Not lifting weights.  Running and walking.   Not counting calories.  Diet reviewed.  Adam [name] averages fast food meals about 2/week.  [employer]Donald's ,  [employer]     History Adam [name] has a past medical history of Asthma, Chicken pox, Chronic headaches, Hypertension (2013), and Sleep apnea.   Adam [name] has a past surgical history that includes No past surgeries.   His family history includes Arthritis in his mother; Asthma in his sister; Diabetes in his brother, father, maternal grandfather, maternal grandmother, and sister; Heart disease in his sister; Heart disease (age of onset: 39) in his maternal grandmother; Hypertension in his brother, father, and mother;  Kidney disease in his maternal grandfather; Lupus in his sister.Adam [name] reports that  has never smoked. Adam [name] has never used smokeless tobacco. Adam [name] reports that Adam [name] does not drink alcohol or use drugs.  Outpatient Medications Prior to Visit  Medication Sig Dispense Refill  . albuterol (PROVENTIL HFA;VENTOLIN HFA) 108 (90 BASE) MCG/ACT inhaler Inhale 2  puffs into the lungs every 4 (four) hours as needed for wheezing. 1 Inhaler 0  . aspirin 81 MG tablet Take 81 mg by mouth daily.    . carvedilol (COREG) 6.25 MG tablet TAKE ONE TABLET BY MOUTH TWICE DAILY WITH A MEAL 180 tablet 3  . lisinopril-hydrochlorothiazide (PRINZIDE,ZESTORETIC) 20-12.5 MG tablet TAKE TWO TABLETS BY MOUTH ONCE DAILY 180 tablet 3  . Multiple Vitamins-Minerals (MULTIVITAMIN WITH MINERALS) tablet Take 1 tablet by mouth daily.     No facility-administered medications prior to visit.     Review of Systems   Patient denies headache, fevers, malaise, unintentional weight loss, skin rash, eye pain, sinus congestion and sinus pain, sore throat, dysphagia,  hemoptysis , cough, dyspnea, wheezing, chest pain, palpitations, orthopnea, edema, abdominal pain, nausea, melena, diarrhea, constipation, flank pain, dysuria, hematuria, urinary  Frequency, nocturia, numbness, tingling, seizures,  Focal weakness, Loss of consciousness,  Tremor, insomnia, depression, anxiety, and suicidal ideation.     Objective:  BP 122/78 (BP Location: Left Arm, Patient Position: Sitting, Cuff Size: Large)   Pulse 73   Temp 98 F (36.7 C) (Oral)   Resp 15   Ht 6' 3"  (1.905 m)   Wt (!) 316 lb 12.8 oz (143.7 kg)   SpO2 95%   BMI 39.60 kg/m   Physical Exam   General appearance: alert, obese, cooperative and appears stated age Ears: normal TM's and external ear canals both ears Throat: lips, mucosa, and tongue normal; teeth and gums normal Neck: no adenopathy, no carotid bruit, supple, symmetrical, trachea midline and thyroid not enlarged, symmetric, no tenderness/mass/nodules Back: symmetric, no curvature. ROM normal. No CVA tenderness. Lungs: clear to auscultation bilaterally Heart: regular rate and rhythm, S1, S2 normal, no murmur, click, rub or gallop Abdomen: soft, non-tender; bowel sounds normal; no masses,  no organomegaly Pulses: 2+ and symmetric Skin: Skin color, texture, turgor normal. No  rashes or lesions Lymph nodes: Cervical, supraclavicular, and axillary nodes normal.   Assessment & Plan:   Problem List Items Addressed This Visit    Encounter for preventive health examination    Annual comprehensive preventive exam was done as well as an evaluation and management of chronic conditions .  During the course of the visit the patient was educated and counseled about appropriate screening and preventive services including :  diabetes screening, lipid analysis with projected  10 year  risk for CAD , nutrition counseling, breast, cervical and colorectal cancer screening, and recommended immunizations.  Printed recommendations for health maintenance screenings was given      Fatty liver disease, nonalcoholic    Presumed by ultrasound changes and serologies negative for autoimmune causes of hepatitis.  Now at risk for dabetes as well , and liver enzymes are elevated.    all modifiable risk factors including obesity, diabetes and hyperlipidemia have been addressed today .  Recommend GI referral at this time.  Adam [name] has received the hepatitis A/B vaccines  Lab Results  Component Value Date   ALT 98 (H) 08/21/2017   AST 49 (H) 08/21/2017   ALKPHOS 58 08/21/2017   BILITOT 1.5 (H) 08/21/2017  Relevant Orders   Comprehensive metabolic panel (Completed)   Lipid panel (Completed)   HTN (hypertension)    Well controlled on current regimen. Renal function stable, no changes today.  Lab Results  Component Value Date   CREATININE 1.30 08/21/2017    Lab Results  Component Value Date   NA 138 08/21/2017   K 4.0 08/21/2017   CL 99 08/21/2017   CO2 32 08/21/2017         Relevant Orders   Comprehensive metabolic panel (Completed)   Microalbumin / creatinine urine ratio (Completed)   Obesity    I have addressed  BMI and recommended a low glycemic index diet utilizing smaller more frequent meals to increase metabolism.  I have also recommended that patient start  exercising with a goal of 30 minutes of aerobic exercise a minimum of 5 days per week. Screening for lipid disorders and diabetes to be done today.  Lab Results  Component Value Date   CHOL 170 08/21/2017   HDL 44.60 08/21/2017   LDLCALC 97 08/21/2017   TRIG 140.0 08/21/2017   CHOLHDL 4 08/21/2017   Lab Results  Component Value Date   HGBA1C 5.9 08/21/2017             OSA on CPAP    Diagnosed by sleep study. Adam [name] is wearing her CPAP every night a minimum of 6 hours per night and notes improved daytime wakefulness and decreased fatigue       Prediabetes    His random glucose is not elevated but his A1c suggests that Adam [name] is at risk for developing diabetes .  I recommend Adam [name] follow a low glycemic index diet and particpate regularly in an aerobic  exercise activity.  We should check an A1c in 6 months.        Other Visit Diagnoses    Prostate cancer screening    -  Primary   Relevant Orders   PSA (Completed)   Impaired fasting glucose       Relevant Orders   Hemoglobin A1c (Completed)      I am having Adam [name] maintain his albuterol, multivitamin with minerals, aspirin, carvedilol, and lisinopril-hydrochlorothiazide.  No orders of the defined types were placed in this encounter.   There are no discontinued medications.  Follow-up: No Follow-up on file.   [name], MD

## 2017-08-21 NOTE — Patient Instructions (Signed)
turmeric as a natural anti inflammatory.  Available as a pill  Steam a vegetable medley (broccoli, cauliflower, carrots,  Onions)  And toss lightly with [place] salad dressing   Try the mini sweet peppers as a crunchy  side to sandwhich  tyr sweetinening your smoothies with Stevia instead of pineapple  (natural, no carbs )    You might want to try a premixed protein drink called Premier Protein shake for breakfast or late night snack . It is great tasting,   very low sugar and available of < $2 serving at [facility] and  In bulk for $1.50/serving at [employer] and [employer]  .    Nutritional analysis :  160 cal  30 g protein  1 g sugar 50% calcium needs   Vladimir Faster and [employer]   There are plenty of high protein low carb cookies,  But they're not called "cookies."  Look for them in the diet section  where the protein shakes are  Sold.   All of these have 5 g sugar or less : Power crunch Atkins bars KIND :thE  "low glycemic index"  variety QUEST : (taste better after being microwaved OUT OF THE WRAPPER)    Danton Clap now makes a frozen breakfast frittata  And an egg sandwhich  that can be microwaved in 2 minutes and is very low carb. Frittatas are similar to quiches without the crust

## 2017-08-24 DIAGNOSIS — E118 Type 2 diabetes mellitus with unspecified complications: Secondary | ICD-10-CM | POA: Insufficient documentation

## 2017-08-24 DIAGNOSIS — R7303 Prediabetes: Secondary | ICD-10-CM | POA: Insufficient documentation

## 2017-08-24 NOTE — Assessment & Plan Note (Signed)
I have addressed  BMI and recommended a low glycemic index diet utilizing smaller more frequent meals to increase metabolism.  I have also recommended that patient start exercising with a goal of 30 minutes of aerobic exercise a minimum of 5 days per week. Screening for lipid disorders and diabetes to be done today.  Lab Results  Component Value Date   CHOL 170 08/21/2017   HDL 44.60 08/21/2017   LDLCALC 97 08/21/2017   TRIG 140.0 08/21/2017   CHOLHDL 4 08/21/2017   Lab Results  Component Value Date   HGBA1C 5.9 08/21/2017

## 2017-08-24 NOTE — Assessment & Plan Note (Signed)
Diagnosed by sleep study. he is wearing her CPAP every night a minimum of 6 hours per night and notes improved daytime wakefulness and decreased fatigue

## 2017-08-24 NOTE — Assessment & Plan Note (Signed)
Annual comprehensive preventive exam was done as well as an evaluation and management of chronic conditions .  During the course of the visit the patient was educated and counseled about appropriate screening and preventive services including :  diabetes screening, lipid analysis with projected  10 year  risk for CAD , nutrition counseling, breast, cervical and colorectal cancer screening, and recommended immunizations.  Printed recommendations for health maintenance screenings was given

## 2017-08-24 NOTE — Assessment & Plan Note (Signed)
Well controlled on current regimen. Renal function stable, no changes today.  Lab Results  Component Value Date   CREATININE 1.30 08/21/2017    Lab Results  Component Value Date   NA 138 08/21/2017   K 4.0 08/21/2017   CL 99 08/21/2017   CO2 32 08/21/2017

## 2017-08-24 NOTE — Assessment & Plan Note (Signed)
His random glucose is not elevated but his A1c suggests that he is at risk for developing diabetes .  I recommend he follow a low glycemic index diet and particpate regularly in an aerobic  exercise activity.  We should check an A1c in 6 months.

## 2017-08-24 NOTE — Assessment & Plan Note (Addendum)
Presumed by ultrasound changes and serologies negative for autoimmune causes of hepatitis.  Now at risk for dabetes as well , and liver enzymes are elevated.    all modifiable risk factors including obesity, diabetes and hyperlipidemia have been addressed today .  Recommend GI referral at this time.  He has received the hepatitis A/B vaccines  Lab Results  Component Value Date   ALT 98 (H) 08/21/2017   AST 49 (H) 08/21/2017   ALKPHOS 58 08/21/2017   BILITOT 1.5 (H) 08/21/2017

## 2017-08-27 ENCOUNTER — Telehealth: Payer: Self-pay

## 2017-08-27 DIAGNOSIS — R7303 Prediabetes: Secondary | ICD-10-CM

## 2017-08-27 DIAGNOSIS — E785 Hyperlipidemia, unspecified: Secondary | ICD-10-CM

## 2017-08-27 NOTE — Telephone Encounter (Signed)
Labs ordered.

## 2018-02-13 ENCOUNTER — Telehealth: Payer: Self-pay | Admitting: Radiology

## 2018-02-13 DIAGNOSIS — G8929 Other chronic pain: Secondary | ICD-10-CM

## 2018-02-13 DIAGNOSIS — K760 Fatty (change of) liver, not elsewhere classified: Secondary | ICD-10-CM

## 2018-02-13 DIAGNOSIS — I10 Essential (primary) hypertension: Secondary | ICD-10-CM

## 2018-02-13 DIAGNOSIS — R7303 Prediabetes: Secondary | ICD-10-CM

## 2018-02-13 DIAGNOSIS — M25561 Pain in right knee: Principal | ICD-10-CM

## 2018-02-13 NOTE — Telephone Encounter (Signed)
Pt coming in for labs next Tuesday, please place future orders. Thank you.

## 2018-02-18 ENCOUNTER — Other Ambulatory Visit: Payer: 59

## 2018-02-24 ENCOUNTER — Ambulatory Visit: Payer: 59 | Admitting: Internal Medicine

## 2018-05-27 ENCOUNTER — Telehealth: Payer: Self-pay | Admitting: Internal Medicine

## 2018-05-27 DIAGNOSIS — Z9989 Dependence on other enabling machines and devices: Principal | ICD-10-CM

## 2018-05-27 DIAGNOSIS — G4733 Obstructive sleep apnea (adult) (pediatric): Secondary | ICD-10-CM

## 2018-05-27 NOTE — Telephone Encounter (Unsigned)
Copied from [place] [id number]. Topic: Quick Communication - See Telephone Encounter >> May 27, 2018  1:28 PM Neva Seat wrote: Needing a referral to see his Cpap specialist - now has coverage.

## 2018-05-27 NOTE — Telephone Encounter (Signed)
Request for referral for CPAP

## 2018-05-28 NOTE — Telephone Encounter (Signed)
His CPAP specialist is his cardiologist Dr Fransico Him and he is due for one year follow up with her,  . Referral made

## 2018-05-28 NOTE — Telephone Encounter (Signed)
Pt now has insurance and would like a referral for CPAP

## 2018-05-29 NOTE — Telephone Encounter (Signed)
Spoke with pt's wife(on DPR) and informed her that the referral has been made for pt. Wife gave a verbal understanding.

## 2018-06-17 ENCOUNTER — Telehealth: Payer: Self-pay | Admitting: Internal Medicine

## 2018-06-17 ENCOUNTER — Other Ambulatory Visit: Payer: Self-pay

## 2018-06-17 MED ORDER — CARVEDILOL 6.25 MG PO TABS: 6.2500 mg | ORAL_TABLET | Freq: Two times a day (BID) | ORAL | 3 refills | Status: DC

## 2018-06-17 NOTE — Telephone Encounter (Signed)
Carvedilol refill Last refill:06/03/17 #180/3 refill (expired) Last OV:08/21/17 [id number] Pharmacy: [place], [place] [phone] (Phone) [phone] (Fax)

## 2018-06-17 NOTE — Telephone Encounter (Signed)
rx has been resent to [facility]

## 2018-06-17 NOTE — Telephone Encounter (Signed)
Wife calling to have the pharmacy corrected. Please send to [facility] employee pharmacy listed in prior note.

## 2018-06-17 NOTE — Telephone Encounter (Signed)
Copied from [place] [id number]. Topic: Quick Communication - Rx Refill/Question >> Jun 17, 2018 10:24 AM Yvette Rack wrote: Medication: carvedilol (COREG) 6.25 MG tablet  Has the patient contacted their pharmacy? no  Preferred Pharmacy (with phone number or street name): [place], [place] [phone] (Phone) [phone] (Fax)  Agent: Please be advised that RX refills may take up to 3 business days. We ask that you follow-up with your pharmacy.

## 2018-06-23 ENCOUNTER — Telehealth: Payer: Self-pay | Admitting: Internal Medicine

## 2018-06-23 MED ORDER — LISINOPRIL-HYDROCHLOROTHIAZIDE 20-12.5 MG PO TABS: 2.0000 | ORAL_TABLET | Freq: Every day | ORAL | 1 refills | Status: DC

## 2018-06-23 NOTE — Telephone Encounter (Signed)
Medication has been refilled.

## 2018-06-23 NOTE — Telephone Encounter (Signed)
Pt is requesting a refill on his lisinopril-hydrochlorothiazide (PRINZIDE,ZESTORETIC) 20-12.5 MG tablet. He wants this to be refilled at [facility].

## 2018-07-08 ENCOUNTER — Ambulatory Visit (INDEPENDENT_AMBULATORY_CARE_PROVIDER_SITE_OTHER): Payer: No Typology Code available for payment source | Admitting: Internal Medicine

## 2018-07-08 ENCOUNTER — Encounter: Payer: Self-pay | Admitting: Internal Medicine

## 2018-07-08 VITALS — BP 112/82 | HR 65 | Temp 98.5°F | Resp 16 | Ht 75.0 in | Wt 319.2 lb

## 2018-07-08 DIAGNOSIS — Z6839 Body mass index (BMI) 39.0-39.9, adult: Secondary | ICD-10-CM

## 2018-07-08 DIAGNOSIS — I10 Essential (primary) hypertension: Secondary | ICD-10-CM | POA: Diagnosis not present

## 2018-07-08 DIAGNOSIS — R7303 Prediabetes: Secondary | ICD-10-CM | POA: Diagnosis not present

## 2018-07-08 DIAGNOSIS — Z0000 Encounter for general adult medical examination without abnormal findings: Secondary | ICD-10-CM | POA: Diagnosis not present

## 2018-07-08 DIAGNOSIS — Z9989 Dependence on other enabling machines and devices: Secondary | ICD-10-CM

## 2018-07-08 DIAGNOSIS — Z125 Encounter for screening for malignant neoplasm of prostate: Secondary | ICD-10-CM | POA: Diagnosis not present

## 2018-07-08 DIAGNOSIS — Z23 Encounter for immunization: Secondary | ICD-10-CM

## 2018-07-08 DIAGNOSIS — K760 Fatty (change of) liver, not elsewhere classified: Secondary | ICD-10-CM

## 2018-07-08 DIAGNOSIS — G4733 Obstructive sleep apnea (adult) (pediatric): Secondary | ICD-10-CM

## 2018-07-08 MED ORDER — CARVEDILOL PHOSPHATE ER 10 MG PO CP24: 10.0000 mg | ORAL_CAPSULE | Freq: Every day | ORAL | 2 refills | Status: DC

## 2018-07-08 NOTE — Assessment & Plan Note (Signed)
I have addressed  BMI and recommended wt loss of 10% of body weight over the next 12 months using a low glycemic index diet and regular exercise a minimum of 5 days per week.

## 2018-07-08 NOTE — Assessment & Plan Note (Signed)
Presumed by ultrasound changes and serologies negative for autoimmune causes of hepatitis.    all modifiable risk factors including obesity, prediabetes and hyperlipidemia have been addressed today .  He has received the hepatitis A/B vaccines

## 2018-07-08 NOTE — Assessment & Plan Note (Signed)
Well controlled on current regimen. Renal function due

## 2018-07-08 NOTE — Patient Instructions (Addendum)
For your allergies ,  You can use Benadryl but you should also consider adding one of these newer second generation antihistamines that are longer acting, non sedating and  available OTC:  Generic  Zyrtec, which is cetirizine.    generic Allegra , available generically as fexofenadine ; comes in 60 mg and 180 mg once daily strengths.    Generic Claritin :  also available as loratidine .   Marland Kitchen  To make a low carb chip :  Take the Joseph's Lavash or Pita bread,  Or the [place] Low carb whole wheat tortilla   Place on metal cookie sheet  Brush with olive oil  Sprinkle garlic powder (NOT garlic salt), grated parmesan cheese, mediterranean seasoning , or all of them?  Bake at 275 for 30 minutes   We have substitutions for your potatoes!!  Try the mashed cauliflower and riced cauliflower dishes instead of rice and mashed potatoes  Mashed turnips are also very low carb!   For desserts :  Try the Dannon Lt n Fit greek yogurt dessert flavors and top with reddi Whip .  8 carbs,  80 calories  Try Oikos Triple Zero [place] Yogurt in the salted caramel, and the coffee flavors  With Whipped Cream for dessert  breyer's low carb ice cream, available in bars (on a stick, better ) or scoopable ice cream  HERE ARE THE LOW CARB  BREAD CHOICES         Prediabetes Eating Plan Prediabetes-also called impaired glucose tolerance or impaired fasting glucose-is a condition that causes blood sugar (blood glucose) levels to be higher than normal. Following a healthy diet can help to keep prediabetes under control. It can also help to lower the risk of type 2 diabetes and heart disease, which are increased in people who have prediabetes. Along with regular exercise, a healthy diet:  Promotes weight loss.  Helps to control blood sugar levels.  Helps to improve the way that the body uses insulin.  What do I need to know about this eating plan?  Use the glycemic index (GI) to plan your meals. The  index tells you how quickly a food will raise your blood sugar. Choose low-GI foods. These foods take a longer time to raise blood sugar.  Pay close attention to the amount of carbohydrates in the food that you eat. Carbohydrates increase blood sugar levels.  Keep track of how many calories you take in. Eating the right amount of calories will help you to achieve a healthy weight. Losing about 7 percent of your starting weight can help to prevent type 2 diabetes.  You may want to follow a Mediterranean diet. This diet includes a lot of vegetables, lean meats or fish, whole grains, fruits, and healthy oils and fats. What foods can I eat? Grains Whole grains, such as whole-wheat or whole-grain breads, crackers, cereals, and pasta. Unsweetened oatmeal. Bulgur. Barley. Quinoa. Brown rice. Corn or whole-wheat flour tortillas or taco shells. Vegetables Lettuce. Spinach. Peas. Beets. Cauliflower. Cabbage. Broccoli. Carrots. Tomatoes. Squash. Eggplant. Herbs. Peppers. Onions. Cucumbers. Brussels sprouts. Fruits Berries. Bananas. Apples. Oranges. Grapes. Papaya. Mango. Pomegranate. Kiwi. Grapefruit. Cherries. Meats and Other Protein Sources Seafood. Lean meats, such as chicken and [place] or lean cuts of pork and beef. Tofu. Eggs. Nuts. Beans. Dairy Low-fat or fat-free dairy products, such as yogurt, cottage cheese, and cheese. Beverages Water. Tea. Coffee. Sugar-free or diet soda. Seltzer water. Milk. Milk alternatives, such as soy or almond milk. Condiments Mustard. Relish. Low-fat, low-sugar ketchup.  Low-fat, low-sugar barbecue sauce. Low-fat or fat-free mayonnaise. Sweets and Desserts Sugar-free or low-fat pudding. Sugar-free or low-fat ice cream and other frozen treats. Fats and Oils Avocado. Walnuts. Olive oil. The items listed above may not be a complete list of recommended foods or beverages. Contact your dietitian for more options. What foods are not recommended? Grains Refined white  flour and flour products, such as bread, pasta, snack foods, and cereals. Beverages Sweetened drinks, such as sweet iced tea and soda. Sweets and Desserts Baked goods, such as cake, cupcakes, pastries, cookies, and cheesecake. The items listed above may not be a complete list of foods and beverages to avoid. Contact your dietitian for more information. This information is not intended to replace advice given to you by your health care provider. Make sure you discuss any questions you have with your health care provider. Document Released: 01/25/2015 Document Revised: 02/16/2016 Document Reviewed: 10/06/2014 Elsevier Interactive Patient Education  2017 [employer].

## 2018-07-08 NOTE — Assessment & Plan Note (Signed)
Diagnosed by sleep study. he is wearing her CPAP every night a minimum of 6 hours per night and notes improved daytime wakefulness and decreased fatigue

## 2018-07-08 NOTE — Progress Notes (Signed)
Patient ID: Adam [name], male    DOB: [birth year]  Age: 42 y.o. MRN: [id number]  The patient is here for annual preventive wellness examination and management of other chronic and acute problems.   The risk factors are reflected in the social history.  The roster of all physicians providing medical care to patient - is listed in the Snapshot section of the chart.  Activities of daily living:  The patient is 100% independent in all ADLs: dressing, toileting, feeding as well as independent mobility  Home safety : The patient has smoke detectors in the home. They wear seatbelts.  There are no firearms at home. There is no violence in the home.   There is no risks for hepatitis, STDs or HIV. There is no   history of blood transfusion. They have no travel history to infectious disease endemic areas of the world.  The patient has seen their dentist in the last six month. They have seen their eye doctor in the last year. They admit to slight hearing difficulty with regard to whispered voices and some television programs.  They have deferred audiologic testing in the last year.  They do not  have excessive sun exposure. Discussed the need for sun protection: hats, long sleeves and use of sunscreen if there is significant sun exposure.   Diet: the importance of a healthy diet is discussed. They do have a healthy diet.  The benefits of regular aerobic exercise were discussed. He exercises 3 days  Per week:  Runs,  Pushup sit ups  Basketball,  60 minutes or longer    Depression screen: there are no signs or vegative symptoms of depression- irritability, change in appetite, anhedonia, sadness/tearfullness.   The following portions of the patient's history were reviewed and updated as appropriate: allergies, current medications, past family history, past medical history,  past surgical history, past social history  and problem list.  Visual acuity was not assessed per patient preference since she has  regular follow up with her ophthalmologist. Hearing and body mass index were assessed and reviewed.   During the course of the visit the patient was educated and counseled about appropriate screening and preventive services including : fall prevention , diabetes screening, nutrition counseling, colorectal cancer screening, and recommended immunizations.    CC: The primary encounter diagnosis was Prostate cancer screening. Diagnoses of Prediabetes, Essential hypertension, Fatty liver disease, nonalcoholic, Need for immunization against influenza, Encounter for preventive health examination, Class 2 severe obesity due to excess calories with serious comorbidity and body mass index (BMI) of 39.0 to 39.9 in adult ([place]), and OSA on CPAP were also pertinent to this visit.   HTN;  Patient has not been seen in a year for hypertension follow up and her last assessment of renal function was a year ago.  Advised her that [facility] is q 6 month follow up with renal function assessment .  He is Taking prinizide in the am .  Has not had any subsequent  pre syncopal episodes .  je notes some LE edema at the end off day  OSA: Using CPAP nightly .  Not using it when napping , naps are disturbed by waking up frequently   History Jaamal has a past medical history of Asthma, Chicken pox, Chronic headaches, Hypertension (2013), and Sleep apnea.   He has a past surgical history that includes No past surgeries.   His family history includes Arthritis in his mother; Asthma in his sister; Diabetes in his brother, father,  maternal grandfather, maternal grandmother, and sister; Heart disease in his sister; Heart disease (age of onset: 83) in his maternal grandmother; Hypertension in his brother, father, and mother; Kidney disease in his maternal grandfather; Lupus in his sister.He reports that he has never smoked. He has never used smokeless tobacco. He reports that he does not drink alcohol or use drugs.  Outpatient Medications  Prior to Visit  Medication Sig Dispense Refill  . albuterol (PROVENTIL HFA;VENTOLIN HFA) 108 (90 BASE) MCG/ACT inhaler Inhale 2 puffs into the lungs every 4 (four) hours as needed for wheezing. 1 Inhaler 0  . aspirin 81 MG tablet Take 81 mg by mouth daily.    Marland Kitchen lisinopril-hydrochlorothiazide (PRINZIDE,ZESTORETIC) 20-12.5 MG tablet Take 2 tablets by mouth daily. 180 tablet 1  . Multiple Vitamins-Minerals (MULTIVITAMIN WITH MINERALS) tablet Take 1 tablet by mouth daily.    . carvedilol (COREG) 6.25 MG tablet Take 1 tablet (6.25 mg total) by mouth 2 (two) times daily with a meal. 180 tablet 3   No facility-administered medications prior to visit.     Review of Systems   Patient denies headache, fevers, malaise, unintentional weight loss, skin rash, eye pain, sinus congestion and sinus pain, sore throat, dysphagia,  hemoptysis , cough, dyspnea, wheezing, chest pain, palpitations, orthopnea, edema, abdominal pain, nausea, melena, diarrhea, constipation, flank pain, dysuria, hematuria, urinary  Frequency, nocturia, numbness, tingling, seizures,  Focal weakness, Loss of consciousness,  Tremor, insomnia, depression, anxiety, and suicidal ideation.     Objective:  BP 112/82 (BP Location: Left Arm, Patient Position: Sitting, Cuff Size: Large)   Pulse 65   Temp 98.5 F (36.9 C) (Oral)   Resp 16   Ht 6' 3"  (1.905 m)   Wt (!) 319 lb 3.2 oz (144.8 kg)   SpO2 98%   BMI 39.90 kg/m   Physical Exam   General appearance: alert, cooperative and appears stated age Ears: normal TM's and external ear canals both ears Throat: lips, mucosa, and tongue normal; teeth and gums normal Neck: no adenopathy, no carotid bruit, supple, symmetrical, trachea midline and thyroid not enlarged, symmetric, no tenderness/mass/nodules Back: symmetric, no curvature. ROM normal. No CVA tenderness. Lungs: clear to auscultation bilaterally Heart: regular rate and rhythm, S1, S2 normal, no murmur, click, rub or  gallop Abdomen: soft, non-tender; bowel sounds normal; no masses,  no organomegaly Pulses: 2+ and symmetric Skin: Skin color, texture, turgor normal. No rashes or lesions Lymph nodes: Cervical, supraclavicular, and axillary nodes normal.    Assessment & Plan:   Problem List Items Addressed This Visit    Encounter for preventive health examination    Annual comprehensive preventive exam was done as well as an evaluation and management of acute and chronic conditions .  During the course of the visit the patient was educated and counseled about appropriate screening and preventive services including :  diabetes screening, lipid analysis with projected  10 year  risk for CAD , nutrition counseling, prostate and colorectal cancer screening, and recommended immunizations.  Printed recommendations for health maintenance screenings was given.       Fatty liver disease, nonalcoholic    Presumed by ultrasound changes and serologies negative for autoimmune causes of hepatitis.    all modifiable risk factors including obesity, prediabetes and hyperlipidemia have been addressed today .  He has received the hepatitis A/B vaccines      Relevant Orders   Lipid panel   HTN (hypertension)    Well controlled on current regimen. Renal function due  Relevant Medications   carvedilol (COREG CR) 10 MG 24 hr capsule   Other Relevant Orders   Comprehensive metabolic panel   Obesity    I have addressed  BMI and recommended wt loss of 10% of body weight over the next 12 months using a low glycemic index diet and regular exercise a minimum of 5 days per week.        OSA on CPAP    Diagnosed by sleep study. he is wearing her CPAP every night a minimum of 6 hours per night and notes improved daytime wakefulness and decreased fatigue       Prediabetes    His random glucose is not elevated but his A1c suggested that he is at risk for developing diabetes .  He has been following a low  glycemic index  diet about 50% of the time ad he  particpates regularly in aerobic exercise. He has not lost any weight.  Diet reviewed in detail and areas for improvement noted.       Relevant Orders   Hemoglobin A1c    Other Visit Diagnoses    Prostate cancer screening    -  Primary   Relevant Orders   PSA   Need for immunization against influenza       Relevant Orders   Flu Vaccine QUAD 36+ mos IM (Completed)      I have discontinued Cristie Hem D. Converse's carvedilol. I am also having him start on carvedilol. Additionally, I am having him maintain his albuterol, multivitamin with minerals, aspirin, and lisinopril-hydrochlorothiazide.  Meds ordered this encounter  Medications  . carvedilol (COREG CR) 10 MG 24 hr capsule    Sig: Take 1 capsule (10 mg total) by mouth daily.    Dispense:  30 capsule    Refill:  2    Medications Discontinued During This Encounter  Medication Reason  . carvedilol (COREG) 6.25 MG tablet     Follow-up: Return in about 6 months (around 01/07/2019) for hypertension , prediabetes.   [name], MD

## 2018-07-08 NOTE — Assessment & Plan Note (Signed)
Annual comprehensive preventive exam was done as well as an evaluation and management of acute and chronic conditions .  During the course of the visit the patient was educated and counseled about appropriate screening and preventive services including :  diabetes screening, lipid analysis with projected  10 year  risk for CAD , nutrition counseling, prostate and colorectal cancer screening, and recommended immunizations.  Printed recommendations for health maintenance screenings was given.

## 2018-07-08 NOTE — Assessment & Plan Note (Signed)
His random glucose is not elevated but his A1c suggested that he is at risk for developing diabetes .  He has been following a low  glycemic index diet about 50% of the time ad he  particpates regularly in aerobic exercise. He has not lost any weight.  Diet reviewed in detail and areas for improvement noted.

## 2018-07-09 ENCOUNTER — Other Ambulatory Visit: Payer: Self-pay | Admitting: Internal Medicine

## 2018-07-09 DIAGNOSIS — R748 Abnormal levels of other serum enzymes: Secondary | ICD-10-CM

## 2018-07-09 LAB — LIPID PANEL
CHOL/HDL RATIO: 4
Cholesterol: 145 mg/dL (ref 0–200)
HDL: 39.4 mg/dL (ref >39.00)
LDL Cholesterol: 80 mg/dL (ref 0–99)
NONHDL: 105.34
Triglycerides: 126 mg/dL (ref 0.0–149.0)
VLDL: 25.2 mg/dL (ref 0.0–40.0)

## 2018-07-09 LAB — COMPREHENSIVE METABOLIC PANEL
ALT: 85 U/L — AB (ref 0–53)
AST: 41 U/L — AB (ref 0–37)
Albumin: 4.1 g/dL (ref 3.5–5.2)
Alkaline Phosphatase: 57 U/L (ref 39–117)
BUN: 18 mg/dL (ref 6–23)
CHLORIDE: 101 meq/L (ref 96–112)
CO2: 31 meq/L (ref 19–32)
Calcium: 9.7 mg/dL (ref 8.4–10.5)
Creatinine, Ser: 1.19 mg/dL (ref 0.40–1.50)
GFR: 85.97 mL/min (ref >60.00)
GLUCOSE: 92 mg/dL (ref 70–99)
Potassium: 3.7 mEq/L (ref 3.5–5.1)
Sodium: 138 mEq/L (ref 135–145)
Total Bilirubin: 0.8 mg/dL (ref 0.2–1.2)
Total Protein: 6.9 g/dL (ref 6.0–8.3)

## 2018-07-09 LAB — HEMOGLOBIN A1C: HEMOGLOBIN A1C: 5.8 % (ref 4.6–6.5)

## 2018-07-09 LAB — PSA: PSA: 0.51 ng/mL (ref 0.10–4.00)

## 2018-07-31 ENCOUNTER — Telehealth: Payer: Self-pay | Admitting: *Deleted

## 2018-07-31 ENCOUNTER — Encounter: Payer: Self-pay | Admitting: Cardiology

## 2018-07-31 ENCOUNTER — Ambulatory Visit (INDEPENDENT_AMBULATORY_CARE_PROVIDER_SITE_OTHER): Payer: No Typology Code available for payment source | Admitting: Cardiology

## 2018-07-31 VITALS — BP 110/58 | HR 78 | Ht 75.0 in | Wt 319.0 lb

## 2018-07-31 DIAGNOSIS — Z9989 Dependence on other enabling machines and devices: Secondary | ICD-10-CM | POA: Diagnosis not present

## 2018-07-31 DIAGNOSIS — G4733 Obstructive sleep apnea (adult) (pediatric): Secondary | ICD-10-CM

## 2018-07-31 DIAGNOSIS — I10 Essential (primary) hypertension: Secondary | ICD-10-CM

## 2018-07-31 NOTE — Progress Notes (Signed)
Cardiology Office Note:    Date:  07/31/2018   ID:  Adam [name], DOB 09/03/1976, MRN [id number]  PCP:  [name], MD  Cardiologist:  No primary care provider on file.    Referring MD: [name], MD   Chief Complaint  Patient presents with  . Sleep Apnea  . Hypertension    History of Present Illness:    Adam [name] is a 42 y.o. male with a hx of  presyncope, OSA and labile HTN.  He is doing well with his CPAP device. Marland Kitchen  He tolerates the mask and feels the pressure is adequate.  Since going on CPAP he feels rested in the am and has no significant daytime sleepiness.  He denies any significant mouth or nasal dryness or nasal congestion.  He does not think that he snores.   He is here today for followup and is doing well.  He denies any chest pain or pressure, SOB, DOE, PND, orthopnea, LE edema, dizziness, palpitations or syncope. He is compliant with his meds and is tolerating meds with no SE.    Past Medical History:  Diagnosis Date  . Asthma    Childhood  . Chicken pox   . Chronic headaches   . Hypertension 2013  . Sleep apnea    on CPAP    Past Surgical History:  Procedure Laterality Date  . NO PAST SURGERIES      Current Medications: No outpatient medications have been marked as taking for the 07/31/18 encounter (Office Visit) with Sueanne Margarita, MD.     Allergies:   Patient has no known allergies.   Social History   Socioeconomic History  . Marital status: Married    Spouse name: Lattie Haw  . Number of children: 3  . Years of education: 93  . Highest education level: Not on file  Occupational History  . 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 Financial resource strain: Not on file  . Food insecurity:    Worry: Not on file    Inability: Not on file  . Transportation needs:    Medical: Not on file    Non-medical: Not on file  Tobacco Use  . Smoking status: Never Smoker  .  Smokeless tobacco: Never Used  Substance and Sexual Activity  . Alcohol use: No  . Drug use: No  . Sexual activity: Yes  Lifestyle  . Physical activity:    Days per week: Not on file    Minutes per session: Not on file  . Stress: Not on file  Relationships  . Social connections:    Talks on phone: Not on file    Gets together: Not on file    Attends religious service: Not on file    Active member of club or organization: Not on file    Attends meetings of clubs or organizations: Not on file    Relationship status: Not on file  Other Topics Concern  . Not on file  Social History Narrative  . Not on file     Family History: The patient's family history includes Arthritis in his mother; Asthma in his sister; Diabetes in his brother, father, maternal grandfather, maternal grandmother, and sister; Heart disease in his sister; Heart disease (age of onset: 24) in his maternal grandmother; Hypertension in his brother, father, and mother; Kidney disease in his maternal grandfather; Lupus in his sister.  ROS:   Please see the history of present illness.    ROS  All other systems reviewed and negative.   EKGs/Labs/Other Studies Reviewed:    The following studies were reviewed today: PAP download  EKG:  EKG is not ordered today.   Recent Labs: 07/08/2018: ALT 85; BUN 18; Creatinine, Ser 1.19; Potassium 3.7; Sodium 138   Recent Lipid Panel    Component Value Date/Time   CHOL 145 07/08/2018 1553   TRIG 126.0 07/08/2018 1553   HDL 39.40 07/08/2018 1553   CHOLHDL 4 07/08/2018 1553   VLDL 25.2 07/08/2018 1553   LDLCALC 80 07/08/2018 1553    Physical Exam:    VS:  There were no vitals taken for this visit.    Wt Readings from Last 3 Encounters:  07/08/18 (!) 319 lb 3.2 oz (144.8 kg)  08/21/17 (!) 316 lb 12.8 oz (143.7 kg)  04/12/17 (!) 313 lb (142 kg)     GEN:  Well nourished, well developed in no acute distress HEENT: Normal NECK: No JVD; No carotid bruits LYMPHATICS:  No lymphadenopathy CARDIAC: RRR, no murmurs, rubs, gallops RESPIRATORY:  Clear to auscultation without rales, wheezing or rhonchi  ABDOMEN: Soft, non-tender, non-distended MUSCULOSKELETAL:  No edema; No deformity  SKIN: Warm and dry NEUROLOGIC:  Alert and oriented x 3 PSYCHIATRIC:  Normal affect   ASSESSMENT:    1. OSA on CPAP   2. Essential hypertension   3. Class 2 severe obesity due to excess calories with serious comorbidity in adult, unspecified BMI ([place])    PLAN:    In order of problems listed above:  1.  OSA - the patient is tolerating PAP therapy well without any problems. The PAP download was reviewed today and showed an AHI of 0.6/hr on 10 cm H2O with 90% compliance in using more than 4 hours nightly.  The patient has been using and benefiting from PAP use and will continue to benefit from therapy.  I will order him [name] CPAP supplies as well as a mask of choice.  2.  Hypertension - his BP is well controlled on exam today.  Continue on carvedilol CR 10 mg daily and lisinopril HCT 20-12.5 mg 2 tablets daily.  His creatinine was stable at 1.19 and potassium 3.7 on 07/08/2018.  3.  Obesity - I have encouraged hime to get into a routine exercise program and cut back on carbs and portions.    Medication Adjustments/Labs and Tests Ordered: Current medicines are reviewed at length with the patient today.  Concerns regarding medicines are outlined above.  No orders of the defined types were placed in this encounter.  No orders of the defined types were placed in this encounter.   Signed, Fransico Him, MD  07/31/2018 10:20 AM    [place]

## 2018-07-31 NOTE — Telephone Encounter (Signed)
-----   Message from Cleon Gustin, [place] sent at 07/31/2018 10:35 AM EST ----- Regarding: CPAP Supplies Dr. Radford Pax would like for you to order a new mask of choice for the patient and PAP supplies.  Thanks

## 2018-07-31 NOTE — Telephone Encounter (Signed)
Order faxed and sent via community message to [place] office

## 2018-07-31 NOTE — Patient Instructions (Signed)
Medication Instructions:  Your physician recommends that you continue on your current medications as directed. Please refer to the Current Medication list given to you today.  If you need a refill on your cardiac medications before your next appointment, please call your pharmacy.   Lab work: None Ordered  If you have labs (blood work) drawn today and your tests are completely normal, you will receive your results only by: Marland Kitchen MyChart Message (if you have MyChart) OR . A paper copy in the mail If you have any lab test that is abnormal or we need to change your treatment, we will call you to review the results.  Testing/Procedures: None ordered  Follow-Up: At [facility], you and your health needs are our priority.  As part of our continuing mission to provide you with exceptional heart care, we have created designated Provider Care Teams.  These Care Teams include your primary Cardiologist (physician) and Advanced Practice Providers (APPs -  Physician Assistants and Nurse Practitioners) who all work together to provide you with the care you need, when you need it. . You will need a follow up appointment in 1 year.  Please call our office 2 months in advance to schedule this appointment.  You may see Fransico Him, MD or one of the following Advanced Practice Providers on your designated Care Team:   . Lyda Jester, PA-C . Dayna Dunn, PA-C . Ermalinda Barrios, PA-C  Any Other Special Instructions Will Be Listed Below (If Applicable).

## 2018-08-18 ENCOUNTER — Encounter: Payer: Self-pay | Admitting: Gastroenterology

## 2018-08-18 ENCOUNTER — Other Ambulatory Visit
Admission: RE | Admit: 2018-08-18 | Discharge: 2018-08-18 | Disposition: A | Discharge status: Home / Self Care | Payer: No Typology Code available for payment source | Source: Ambulatory Visit | Attending: Gastroenterology | Admitting: Gastroenterology

## 2018-08-18 ENCOUNTER — Ambulatory Visit (INDEPENDENT_AMBULATORY_CARE_PROVIDER_SITE_OTHER): Payer: No Typology Code available for payment source | Admitting: Gastroenterology

## 2018-08-18 VITALS — BP 144/72 | HR 61 | Ht 75.0 in | Wt 319.0 lb

## 2018-08-18 DIAGNOSIS — K760 Fatty (change of) liver, not elsewhere classified: Secondary | ICD-10-CM | POA: Diagnosis present

## 2018-08-18 DIAGNOSIS — R748 Abnormal levels of other serum enzymes: Secondary | ICD-10-CM | POA: Diagnosis present

## 2018-08-18 NOTE — Progress Notes (Signed)
Gastroenterology Consultation  Referring Provider:     [name], MD Primary Care Physician:  [name], MD Primary Gastroenterologist:  Dr. Allen Norris     Reason for Consultation:     Abnormal liver enzymes        HPI:   Adam [name] is a 42 y.o. y/o male referred for consultation & management of abnormal liver enzymes by Dr. Derrel Nip, Aris Everts, MD.  This patient comes in today with a finding of abnormal liver enzymes that appear to have gone back to April 2016.  Patient's ALT was elevated at 75 at that time.  The patient's liver enzymes then had been only seen as an increased ALT for the majority of the time but more recently the patient has been having an elevation of the AST and ALT.  The patient also had a blood test from 2013 that showed the ALT to be elevated.  The patient's ALT has been higher than the ALT and the patient's bilirubin and alkaline phosphatase have been normal for the most part.  The patient's ultrasound of the right upper quadrant showed diffuse hepatic steatosis. (Findings below).  The patient is also remained around the 320 pounds weight for the last few years.  The patient had blood work back in 2016 that showed him to be negative for hepatitis C antibody with negative hepatitis B serology including no surface antibody present to confirm immunity.  The ANA and SMA were both negative at that time also.   Component     Latest Ref Rng & Units 01/14/2015 02/03/2015 05/16/2015  Total Bilirubin     0.2 - 1.2 mg/dL 0.8 0.9 0.9  Alkaline Phosphatase     39 - 117 U/L 68 60 65  AST     0 - 37 U/L 35 40 (H) 32  ALT     0 - 53 U/L 75 (H) 74 (H) 76 (H)   Component     Latest Ref Rng & Units 05/21/2016 08/21/2017  Total Bilirubin     0.2 - 1.2 mg/dL 0.8 1.5 (H)  Alkaline Phosphatase     39 - 117 U/L 51 58  AST     0 - 37 U/L 46 (H) 49 (H)  ALT     0 - 53 U/L 106 (H) 98 (H)   Component     Latest Ref Rng & Units 07/08/2018  Total Bilirubin     0.2 - 1.2 mg/dL  0.8  Alkaline Phosphatase     39 - 117 U/L 57  AST     0 - 37 U/L 41 (H)  ALT     0 - 53 U/L 85 (H)   Right upper quadrant ultrasound  IMPRESSION: Hepatic steatosis. Regional sparing adjacent to the gallbladder fossa suggested by ultrasound appearance. If indicated, CT abdomen could be considered to evaluate in greater detail.   Past Medical History:  Diagnosis Date  . Asthma    Childhood  . Chicken pox   . Chronic headaches   . Hypertension 2013  . Sleep apnea    on CPAP    Past Surgical History:  Procedure Laterality Date  . NO PAST SURGERIES      Prior to Admission medications   Medication Sig Start Date End Date Taking? Authorizing Provider  albuterol (PROVENTIL HFA;VENTOLIN HFA) 108 (90 BASE) MCG/ACT inhaler Inhale 2 puffs into the lungs every 4 (four) hours as needed for wheezing. 07/23/14   Liam Graham, PA-C  aspirin 81 MG tablet Take 81 mg by mouth daily.    [provider]  carvedilol (COREG CR) 10 MG 24 hr capsule Take 1 capsule (10 mg total) by mouth daily. 07/08/18   [name], MD  lisinopril-hydrochlorothiazide (PRINZIDE,ZESTORETIC) 20-12.5 MG tablet Take 2 tablets by mouth daily. 06/23/18   [name], MD  Multiple Vitamins-Minerals (MULTIVITAMIN WITH MINERALS) tablet Take 1 tablet by mouth daily.    [provider]    Family History  Problem Relation Age of Onset  . Arthritis Mother   . Hypertension Mother   . Hypertension Father   . Diabetes Father   . Diabetes Brother   . Hypertension Brother   . Diabetes Maternal Grandmother   . Heart disease Maternal Grandmother 55       massive MI  . Diabetes Maternal Grandfather   . Kidney disease Maternal Grandfather   . Heart disease Sister        cardiomyopathy from drug use  . Diabetes Sister   . Lupus Sister   . Asthma Sister      Social History   Tobacco Use  . Smoking status: Never Smoker  . Smokeless tobacco: Never Used  Substance Use Topics  . Alcohol use:  No  . Drug use: No    Allergies as of 08/18/2018  . (No Known Allergies)    Review of Systems:    All systems reviewed and negative except where noted in HPI.   Physical Exam:  There were no vitals taken for this visit. No LMP for male patient. General:   Alert,  Well-developed, well-nourished, pleasant and cooperative in NAD Head:  Normocephalic and atraumatic. Eyes:  Sclera clear, no icterus.   Conjunctiva pink. Ears:  Normal auditory acuity. Nose:  No deformity, discharge, or lesions. Mouth:  No deformity or lesions,oropharynx pink & moist. Neck:  Supple; no masses or thyromegaly. Lungs:  Respirations even and unlabored.  Clear throughout to auscultation.   No wheezes, crackles, or rhonchi. No acute distress. Heart:  Regular rate and rhythm; no murmurs, clicks, rubs, or gallops. Abdomen:  Normal bowel sounds.  No bruits.  Soft, non-tender and non-distended without masses, hepatosplenomegaly or hernias noted.  No guarding or rebound tenderness.  Negative Carnett sign.   Rectal:  Deferred.  Msk:  Symmetrical without gross deformities.  Good, equal movement & strength bilaterally. Pulses:  Normal pulses noted. Extremities:  No clubbing or edema.  No cyanosis. Neurologic:  Alert and oriented x3;  grossly normal neurologically. Skin:  Intact without significant lesions or rashes.  No jaundice. Lymph Nodes:  No significant cervical adenopathy. Psych:  Alert and cooperative. Normal mood and affect.  Imaging Studies: No results found.  Assessment and Plan:   Adam [name] is a 42 y.o. y/o male who comes in today with a history of fatty liver and abnormal liver enzymes dating back to 2013.  The patient has been told the results of his labs including steatosis on the ultrasound.  The patient will have his labs checked for immunity to hepatitis A and B and the patient may need vaccinations if he is not immune.  The patient has also been told to lose 15% of his body mass to decrease the  amount of fat in his liver.  The patient reports that he has never had a drink in the past.  The patient will follow-up in 6 months to follow his labs.  He will also have his blood work sent off for  other possible causes of abnormal liver enzymes.  The patient has been explained the plan and agrees with it.  [name], MD. Marval Regal    Note: This dictation was prepared with Dragon dictation along with smaller phrase technology. Any transcriptional errors that result from this process are unintentional.

## 2018-08-18 NOTE — Patient Instructions (Addendum)
You are scheduled for a RUQ abdominal US at [facility] on Tuesday, Dec 3rd @ 8:30am. Please arrive at the medical mall registration desk at 8:15am. You cannot have anything to eat or drink after midnight on Monday night.   If you need to reschedule this appointment for any reason, please contact central scheduling at [phone].

## 2018-08-19 LAB — HEPATITIS A ANTIBODY, TOTAL: HEP A TOTAL AB: POSITIVE — AB

## 2018-08-19 LAB — HEPATITIS B SURFACE ANTIBODY,QUALITATIVE: Hep B S Ab: REACTIVE

## 2018-08-19 LAB — ALPHA-1-ANTITRYPSIN: A1 ANTITRYPSIN SER: 87 mg/dL — AB (ref 101–187)

## 2018-08-19 LAB — CERULOPLASMIN: CERULOPLASMIN: 22.7 mg/dL (ref 16.0–31.0)

## 2018-08-22 ENCOUNTER — Encounter: Payer: 59 | Admitting: Internal Medicine

## 2018-08-26 ENCOUNTER — Ambulatory Visit
Admission: RE | Admit: 2018-08-26 | Discharge: 2018-08-26 | Disposition: A | Discharge status: Home / Self Care | Payer: No Typology Code available for payment source | Source: Ambulatory Visit | Attending: Gastroenterology | Admitting: Gastroenterology

## 2018-08-26 ENCOUNTER — Other Ambulatory Visit: Payer: Self-pay

## 2018-08-26 DIAGNOSIS — K760 Fatty (change of) liver, not elsewhere classified: Secondary | ICD-10-CM | POA: Insufficient documentation

## 2018-08-26 DIAGNOSIS — R748 Abnormal levels of other serum enzymes: Secondary | ICD-10-CM | POA: Diagnosis not present

## 2018-08-27 ENCOUNTER — Telehealth: Payer: Self-pay

## 2018-08-27 ENCOUNTER — Encounter: Payer: 59 | Admitting: Internal Medicine

## 2018-08-27 NOTE — Telephone Encounter (Signed)
Pt notified of lab results. Order has been placed for additional lab.

## 2018-08-27 NOTE — Telephone Encounter (Signed)
-----   Message from [name], MD sent at 08/26/2018  9:51 AM EST ----- Patient needs to follow up in the office after her alpha 1 gene blood test.

## 2019-01-07 ENCOUNTER — Ambulatory Visit (INDEPENDENT_AMBULATORY_CARE_PROVIDER_SITE_OTHER): Payer: No Typology Code available for payment source | Admitting: Internal Medicine

## 2019-01-07 DIAGNOSIS — R7303 Prediabetes: Secondary | ICD-10-CM | POA: Diagnosis not present

## 2019-01-07 DIAGNOSIS — K760 Fatty (change of) liver, not elsewhere classified: Secondary | ICD-10-CM | POA: Diagnosis not present

## 2019-01-07 DIAGNOSIS — I10 Essential (primary) hypertension: Secondary | ICD-10-CM

## 2019-01-07 DIAGNOSIS — E8801 Alpha-1-antitrypsin deficiency: Secondary | ICD-10-CM

## 2019-01-07 DIAGNOSIS — Z7189 Other specified counseling: Secondary | ICD-10-CM

## 2019-01-07 MED ORDER — LOSARTAN POTASSIUM 100 MG PO TABS: 100.0000 mg | ORAL_TABLET | Freq: Every day | ORAL | 3 refills | Status: DC

## 2019-01-07 MED ORDER — ALBUTEROL SULFATE HFA 108 (90 BASE) MCG/ACT IN AERS: 2.0000 | INHALATION_SPRAY | RESPIRATORY_TRACT | 2 refills | Status: DC | PRN

## 2019-01-07 MED ORDER — HYDROCHLOROTHIAZIDE 25 MG PO TABS: 25.0000 mg | ORAL_TABLET | Freq: Every day | ORAL | 3 refills | Status: DC

## 2019-01-07 MED ORDER — CARVEDILOL 6.25 MG PO TABS: 6.2500 mg | ORAL_TABLET | Freq: Two times a day (BID) | ORAL | 2 refills | Status: DC

## 2019-01-07 NOTE — Progress Notes (Signed)
Virtual Visit via [employer] Note  This visit type was conducted due to national recommendations for restrictions regarding the COVID-19 pandemic (e.g. social distancing).  This format is felt to be most appropriate for this patient at this time.  All issues noted in this document were discussed and addressed.  No physical exam was performed (except for noted visual exam findings with Video Visits).   I connected with@ on 01/08/19 at 10:00 AM EDT by a video enabled telemedicine application or telephone and verified that I am speaking with the correct person using two identifiers. Location patient: home Location provider: work or home office Persons participating in the virtual visit: patient, provider  I discussed the limitations, risks, security and privacy concerns of performing an evaluation and management service by telephone and the availability of in person appointments. I also discussed with the patient that there may be a patient responsible charge related to this service. The patient expressed understanding and agreed to proceed.  Reason for visit:  6 month follow up on hypertension , obesity with fatty liver and OSA on CPAP   HPI:  1) Hypertension: patient checks blood pressure twice weekly at home.  Readings have been for the most part  at rest . Patient is following a reduce salt diet most days and is taking carvedilol and lisinopril/hct as prescribed   2) Fatty liver:  He was referred to Dr Allen Norris for persistently elevated liver enzymes. Alpha 1 antitrypsin enzyme was LOW in November and he has not read Mychart message requesting he  return for additional testing .  The patient has no signs or symptoms of COVID 19 infection (fever, cough and shortness of breath).   ROS: See pertinent positives and negatives per HPI.  Past Medical History:  Diagnosis Date  . Asthma    Childhood  . Chicken pox   . Chronic headaches   . Hypertension 2013  . Sleep apnea    on CPAP    Past  Surgical History:  Procedure Laterality Date  . NO PAST SURGERIES      Family History  Problem Relation Age of Onset  . Arthritis Mother   . Hypertension Mother   . Hypertension Father   . Diabetes Father   . Diabetes Brother   . Hypertension Brother   . Diabetes Maternal Grandmother   . Heart disease Maternal Grandmother 64       massive MI  . Diabetes Maternal Grandfather   . Kidney disease Maternal Grandfather   . Heart disease Sister        cardiomyopathy from drug use  . Diabetes Sister   . Lupus Sister   . Asthma Sister     SOCIAL HX: married,  Currently unemployed due to the COVID epidemic    Current Outpatient Medications:  .  albuterol (PROVENTIL HFA;VENTOLIN HFA) 108 (90 Base) MCG/ACT inhaler, Inhale 2 puffs into the lungs every 4 (four) hours as needed for wheezing., Disp: 1 Inhaler, Rfl: 2 .  aspirin 81 MG tablet, Take 81 mg by mouth daily., Disp: , Rfl:  .  carvedilol (COREG) 6.25 MG tablet, Take 1 tablet (6.25 mg total) by mouth 2 (two) times daily with a meal., Disp: 180 tablet, Rfl: 2 .  Multiple Vitamins-Minerals (MULTIVITAMIN WITH MINERALS) tablet, Take 1 tablet by mouth daily., Disp: , Rfl:  .  hydrochlorothiazide (HYDRODIURIL) 25 MG tablet, Take 1 tablet (25 mg total) by mouth daily., Disp: 90 tablet, Rfl: 3 .  losartan (COZAAR) 100 MG tablet, Take 1  tablet (100 mg total) by mouth at bedtime., Disp: 90 tablet, Rfl: 3  EXAM:  VITALS per patient if applicable:  GENERAL: alert, oriented, appears well and in no acute distress  HEENT: atraumatic, conjunttiva clear, no obvious abnormalities on inspection of external nose and ears  NECK: normal movements of the head and neck  LUNGS: on inspection no signs of respiratory distress, breathing rate appears normal, no obvious gross SOB, gasping or wheezing  CV: no obvious cyanosis  MS: moves all visible extremities without noticeable abnormality  PSYCH/NEURO: pleasant and cooperative, no obvious depression  or anxiety, speech and thought processing grossly intact  ASSESSMENT AND PLAN:  Discussed the following assessment and plan:  Alpha-1-antitrypsin deficiency (HCC) - Plan: Alpha-1-Antitrypsin Deficiency, CANCELED: Alpha-1-Antitrypsin Deficiency  Fatty liver disease, nonalcoholic - Plan: Comprehensive metabolic panel, CANCELED: Comprehensive metabolic panel  Prediabetes - Plan: Lipid panel, Hemoglobin A1c  Essential hypertension  Educated About Covid-19 Virus Infection  Fatty liver disease, nonalcoholic The possibility of alpha 1 antitrypsin based on previously reported labs in October was discussed with patient.  He will return for additional testing   HTN (hypertension) Well controlled on current regimen. Renal function stable, no changes today.  Prediabetes His random glucose is not elevated but his A1c suggested that he is at risk for developing diabetes .  He has been following a low  glycemic index diet about 50% of the time ad he  particpates regularly in aerobic exercise. He has not lost any weight.  Diet reviewed in detail and areas for improvement noted.   Educated About Covid-19 Virus Infection COVID-19 Education: The signs and symptoms of COVID-19 were discussed with the patient and how to seek care for testing (follow up with PCP or arrange E-visit).  The importance of social distancing was discussed today    I discussed the assessment and treatment plan with the patient. The patient was provided an opportunity to ask questions and all were answered. The patient agreed with the plan and demonstrated an understanding of the instructions.   The patient was advised to call back or seek an in-person evaluation if the symptoms worsen or if the condition fails to improve as anticipated.  I provided 25 minutes of non-face-to-face time during this encounter.   [name], MD

## 2019-01-07 NOTE — Patient Instructions (Signed)
Your blood pressure is at goal if it is < 130/80 on a regular basis    I am making a decision to change your lisinopril hctz  to losartan and hctz , based on increased reports by one of my ENT colleagues of patients  developing tongue and throat swelling from lisinopril.  The condition , called "angioedema," can be fatal if a person's airway is compromised.  I also want you to take the losartan at night instead of morning,  And the hctz in the morning,  .  This is recommended because  recent studies have shown that taking your blood pressure medications at night protects you better from heart attacks and strokes.    Your alpha anti trypsin enzyme level was low.  Dr Allen Norris needs you to have additional labs to confirm this deficiency which can lead to cirrhosis and emphysema  And can be treated if it is confirmed  Janett Billow will call you with the lab appointment.  Please fast for at least 4 hours prior to the labs so I can check your cholesterol as well

## 2019-01-08 DIAGNOSIS — Z7189 Other specified counseling: Secondary | ICD-10-CM | POA: Insufficient documentation

## 2019-01-08 NOTE — Assessment & Plan Note (Signed)
COVID-19 Education: The signs and symptoms of COVID-19 were discussed with the patient and how to seek care for testing (follow up with PCP or arrange E-visit).  The importance of social distancing was discussed today

## 2019-01-08 NOTE — Assessment & Plan Note (Signed)
His random glucose is not elevated but his A1c suggested that he is at risk for developing diabetes .  He has been following a low  glycemic index diet about 50% of the time ad he  particpates regularly in aerobic exercise. He has not lost any weight.  Diet reviewed in detail and areas for improvement noted.

## 2019-01-08 NOTE — Assessment & Plan Note (Signed)
The possibility of alpha 1 antitrypsin based on previously reported labs in October was discussed with patient.  He will return for additional testing

## 2019-01-08 NOTE — Assessment & Plan Note (Signed)
Well controlled on current regimen. Renal function stable, no changes today. 

## 2019-01-13 ENCOUNTER — Other Ambulatory Visit: Payer: Self-pay

## 2019-01-13 ENCOUNTER — Other Ambulatory Visit (INDEPENDENT_AMBULATORY_CARE_PROVIDER_SITE_OTHER): Payer: No Typology Code available for payment source

## 2019-01-13 DIAGNOSIS — R7303 Prediabetes: Secondary | ICD-10-CM

## 2019-01-13 DIAGNOSIS — K760 Fatty (change of) liver, not elsewhere classified: Secondary | ICD-10-CM | POA: Diagnosis not present

## 2019-01-13 DIAGNOSIS — E8801 Alpha-1-antitrypsin deficiency: Secondary | ICD-10-CM

## 2019-01-13 LAB — COMPREHENSIVE METABOLIC PANEL
ALT: 70 U/L — ABNORMAL HIGH (ref 0–53)
AST: 30 U/L (ref 0–37)
Albumin: 4.2 g/dL (ref 3.5–5.2)
Alkaline Phosphatase: 59 U/L (ref 39–117)
BUN: 15 mg/dL (ref 6–23)
CO2: 32 mEq/L (ref 19–32)
Calcium: 9.9 mg/dL (ref 8.4–10.5)
Chloride: 101 mEq/L (ref 96–112)
Creatinine, Ser: 1.23 mg/dL (ref 0.40–1.50)
GFR: 77.67 mL/min (ref >60.00)
Glucose, Bld: 87 mg/dL (ref 70–99)
Potassium: 4 mEq/L (ref 3.5–5.1)
Sodium: 140 mEq/L (ref 135–145)
Total Bilirubin: 1 mg/dL (ref 0.2–1.2)
Total Protein: 7 g/dL (ref 6.0–8.3)

## 2019-01-13 LAB — LIPID PANEL
Cholesterol: 150 mg/dL (ref 0–200)
HDL: 40.1 mg/dL (ref >39.00)
LDL Cholesterol: 90 mg/dL (ref 0–99)
NonHDL: 109.59
Total CHOL/HDL Ratio: 4
Triglycerides: 100 mg/dL (ref 0.0–149.0)
VLDL: 20 mg/dL (ref 0.0–40.0)

## 2019-01-13 LAB — HEMOGLOBIN A1C: Hgb A1c MFr Bld: 6 % (ref 4.6–6.5)

## 2019-01-22 LAB — ALPHA-1-ANTITRYPSIN DEFICIENCY

## 2019-02-17 ENCOUNTER — Ambulatory Visit: Payer: No Typology Code available for payment source | Admitting: Gastroenterology

## 2019-02-23 ENCOUNTER — Encounter: Payer: Self-pay | Admitting: Gastroenterology

## 2019-02-23 ENCOUNTER — Other Ambulatory Visit: Payer: Self-pay

## 2019-02-23 ENCOUNTER — Ambulatory Visit (INDEPENDENT_AMBULATORY_CARE_PROVIDER_SITE_OTHER): Payer: No Typology Code available for payment source | Admitting: Gastroenterology

## 2019-02-23 DIAGNOSIS — R748 Abnormal levels of other serum enzymes: Secondary | ICD-10-CM | POA: Diagnosis not present

## 2019-02-23 NOTE — Progress Notes (Signed)
Adam Lame, MD [address]  [place]  [place], [place] [address]  Main: [phone]  Fax: [phone]    Gastroenterology Virtual/Video Visit  Referring Provider:     [name], MD Primary Care Physician:  Adam Mc, MD Primary Gastroenterologist:  Dr.Princesa Willig Allen Norris Reason for Consultation:     Abnormal liver enzymes        HPI:    Virtual Visit via Video Note Location of the patient: Home Location of provider: Office  Participating persons: The patient myself and Adam [name].  I connected with Adam [name] on 02/23/19 at  8:30 AM EDT by a video enabled telemedicine application and verified that I am speaking with the correct person using two identifiers.   I discussed the limitations of evaluation and management by telemedicine and the availability of in person appointments. The patient expressed understanding and agreed to proceed.  Verbal consent to proceed obtained.  History of Present Illness: Adam [name] is a 43 y.o. male referred by Dr. [name], MD  for consultation & management of abnormal liver enzymes.  This patient had seen me back in November for abnormal liver enzymes.  At that time the patient had a history of being hepatitis B and hepatitis C negative.  He did have an alpha 1 antitrypsin test sent off which showed a Single mutation (Z) identified.  The patient had an ultrasound that showed fatty liver and was asked to lose weight.  His most recent labs showed that his AST alkaline phosphatase and bilirubin were normal but his AST was elevated to 70.  This was an improvement from his previous lab work.  Component     Latest Ref Rng & Units 08/21/2017 07/08/2018 01/13/2019  Total Bilirubin     0.2 - 1.2 mg/dL 1.5 (H) 0.8 1.0  Alkaline Phosphatase     39 - 117 U/L 58 57 59  AST     0 - 37 U/L 49 (H) 41 (H) 30  ALT     0 - 53 U/L 98 (H) 85 (H) 70 (H)   The patient reports that he has been losing weight and has lost a total of  7 pounds.  He reports that his wife is an Therapist, sports and has been on him to lose the weight.  The patient has been informed that he has the mutation for his alpha-1 antitrypsin.  Past Medical History:  Diagnosis Date  . Asthma    Childhood  . Chicken pox   . Chronic headaches   . Hypertension 2013  . Sleep apnea    on CPAP    Past Surgical History:  Procedure Laterality Date  . NO PAST SURGERIES      Prior to Admission medications   Medication Sig Start Date End Date Taking? Authorizing Provider  albuterol (PROVENTIL HFA;VENTOLIN HFA) 108 (90 Base) MCG/ACT inhaler Inhale 2 puffs into the lungs every 4 (four) hours as needed for wheezing. 01/07/19   Adam Mc, MD  aspirin 81 MG tablet Take 81 mg by mouth daily.    [provider]  carvedilol (COREG) 6.25 MG tablet Take 1 tablet (6.25 mg total) by mouth 2 (two) times daily with a meal. 01/07/19   Adam Mc, MD  hydrochlorothiazide (HYDRODIURIL) 25 MG tablet Take 1 tablet (25 mg total) by mouth daily. 01/07/19   Adam Mc, MD  losartan (COZAAR) 100 MG tablet Take 1 tablet (100 mg total) by mouth at bedtime.  01/07/19   Adam Mc, MD  Multiple Vitamins-Minerals (MULTIVITAMIN WITH MINERALS) tablet Take 1 tablet by mouth daily.    [provider]    Family History  Problem Relation Age of Onset  . Arthritis Mother   . Hypertension Mother   . Hypertension Father   . Diabetes Father   . Diabetes Brother   . Hypertension Brother   . Diabetes Maternal Grandmother   . Heart disease Maternal Grandmother 62       massive MI  . Diabetes Maternal Grandfather   . Kidney disease Maternal Grandfather   . Heart disease Sister        cardiomyopathy from drug use  . Diabetes Sister   . Lupus Sister   . Asthma Sister      Social History   Tobacco Use  . Smoking status: Never Smoker  . Smokeless tobacco: Never Used  Substance Use Topics  . Alcohol use: No  . Drug use: No    Allergies as of 02/23/2019   . (No Known Allergies)    Review of Systems:    All systems reviewed and negative except where noted in HPI.   Observations/Objective:  Labs: CBC    Component Value Date/Time   WBC 7.6 01/14/2015 1057   RBC 5.69 01/14/2015 1057   HGB 17.0 01/14/2015 1057   HCT 50.9 01/14/2015 1057   PLT 240.0 01/14/2015 1057   MCV 89.4 01/14/2015 1057   MCHC 33.5 01/14/2015 1057   RDW 12.9 01/14/2015 1057   LYMPHSABS 2.6 01/14/2015 1057   MONOABS 0.8 01/14/2015 1057   EOSABS 0.2 01/14/2015 1057   BASOSABS 0.0 01/14/2015 1057   CMP     Component Value Date/Time   NA 140 01/13/2019 1123   K 4.0 01/13/2019 1123   CL 101 01/13/2019 1123   CO2 32 01/13/2019 1123   GLUCOSE 87 01/13/2019 1123   BUN 15 01/13/2019 1123   CREATININE 1.23 01/13/2019 1123   CALCIUM 9.9 01/13/2019 1123   PROT 7.0 01/13/2019 1123   ALBUMIN 4.2 01/13/2019 1123   AST 30 01/13/2019 1123   ALT 70 (H) 01/13/2019 1123   ALKPHOS 59 01/13/2019 1123   BILITOT 1.0 01/13/2019 1123    Imaging Studies: No results found.  Assessment and Plan:   Adam [name] is a 43 y.o. y/o male being spoken to today because of abnormal liver enzymes.  The patient's liver enzymes are getting better.  The patient was found to have a single mutation for alpha 1 antitrypsin.  Even a single mutation of this gene of the Z allele can cause endorgan damage.  The treatment consists of avoiding any factors that may cause him to have fibrosis and the need for reparative processes.  The patient has also been told to continue losing weight and he has a goal of 15 pounds by the time he next he has a blood test in 3 months.  The patient denies alcohol abuse or smoking and he has been told of the dangers of that.  The patient has been explained the plan and agrees with it.  Follow Up Instructions:  I discussed the assessment and treatment plan with the patient. The patient was provided an opportunity to ask questions and all were answered. The patient  agreed with the plan and demonstrated an understanding of the instructions.   The patient was advised to call back or seek an in-person evaluation if the symptoms worsen or if the condition fails to improve as  anticipated.  I provided 15 minutes of non-face-to-face time during this encounter.   Adam Lame, MD  Speech recognition software was used to dictate the above note.

## 2019-06-03 ENCOUNTER — Telehealth: Payer: Self-pay

## 2019-06-03 ENCOUNTER — Other Ambulatory Visit: Payer: Self-pay

## 2019-06-03 DIAGNOSIS — R748 Abnormal levels of other serum enzymes: Secondary | ICD-10-CM

## 2019-06-03 NOTE — Telephone Encounter (Signed)
Contacted pt and informed him he is due for his repeat labs.

## 2019-06-03 NOTE — Telephone Encounter (Signed)
-----   Message from [place], [place] sent at 02/23/2019  8:50 AM EDT ----- Recheck his LFT's. Check weight loss

## 2019-10-27 DIAGNOSIS — G4733 Obstructive sleep apnea (adult) (pediatric): Secondary | ICD-10-CM

## 2019-10-28 IMAGING — US US ABDOMEN LIMITED
1 series · 14 of 25 positions shown · non-contrast
Comparison: 01/03/2012

CLINICAL DATA: Nonalcoholic fatty liver disease. Elevated liver
enzymes.

EXAM:
ULTRASOUND ABDOMEN LIMITED RIGHT UPPER QUADRANT

[Series 1: us abdomen limited · 14 of 56 slices shown]
[im 1/56]
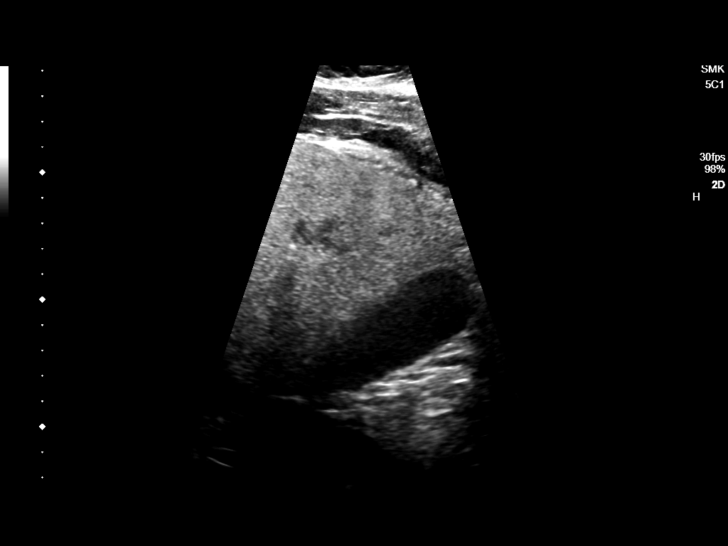
[im 5/56]
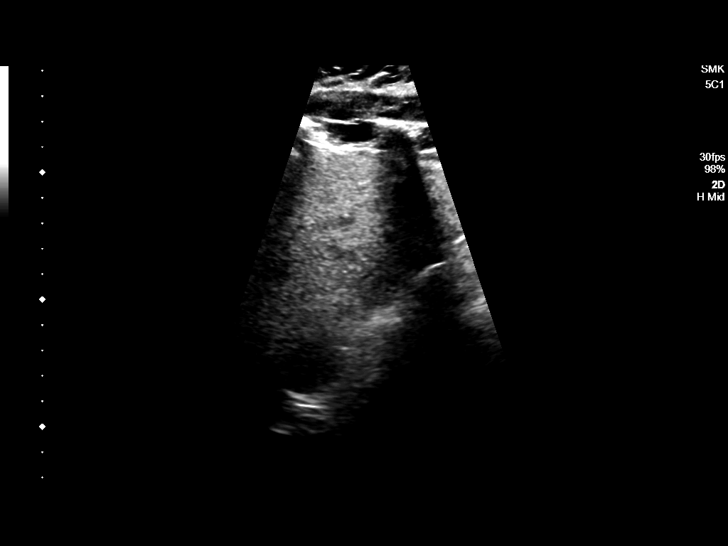
[im 10/56]
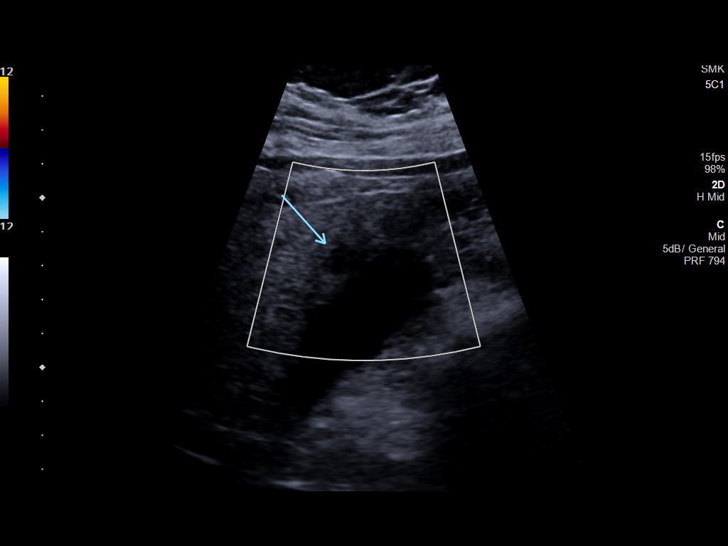
[im 14/56]
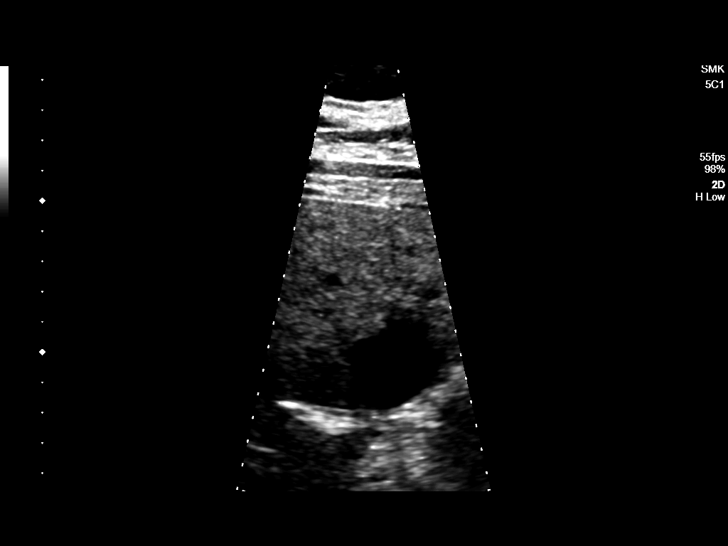
[im 19/56]
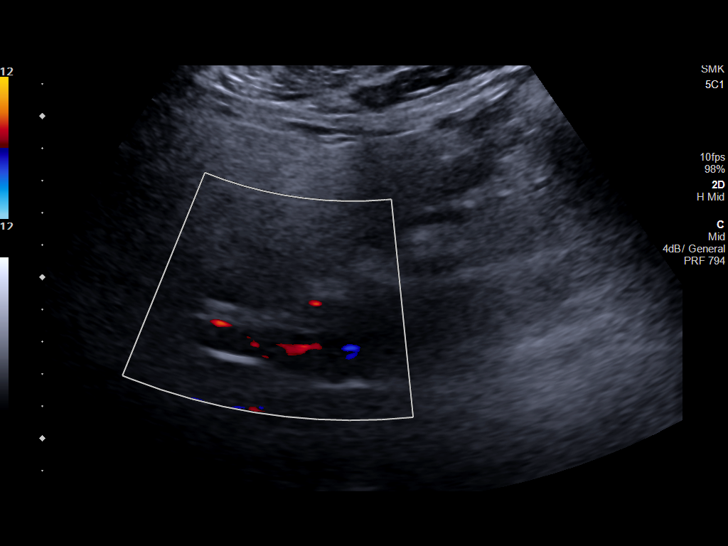
[im 21/56]
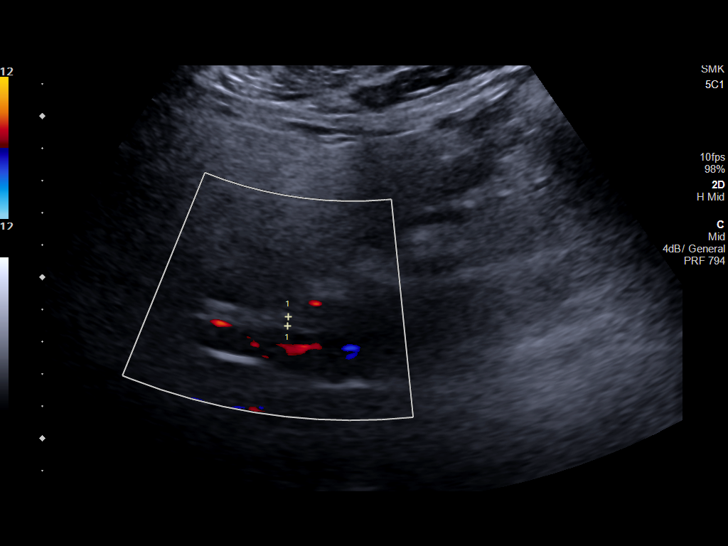
[im 26/56]
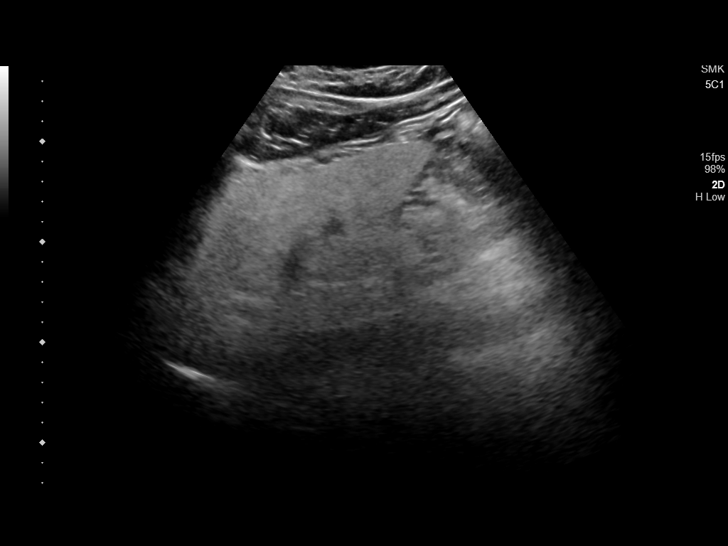
[im 30/56]
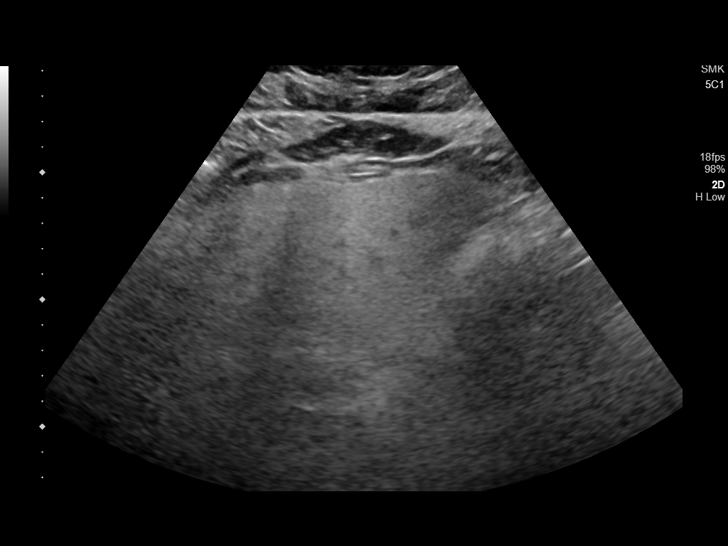
[im 35/56]
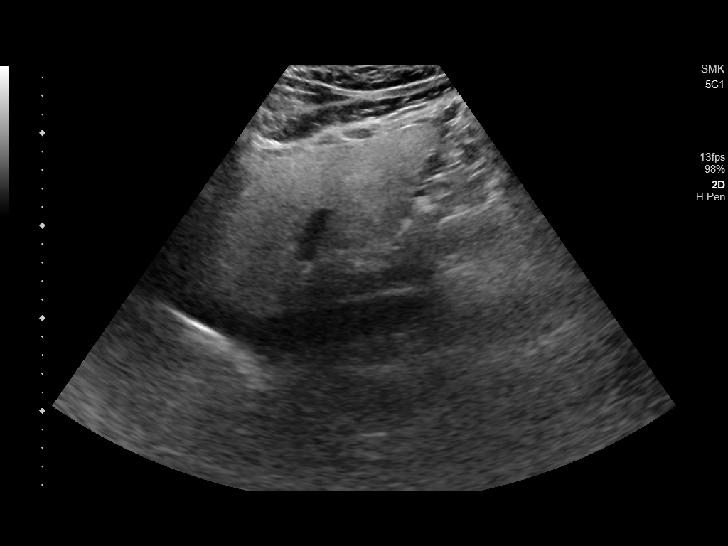
[im 37/56]
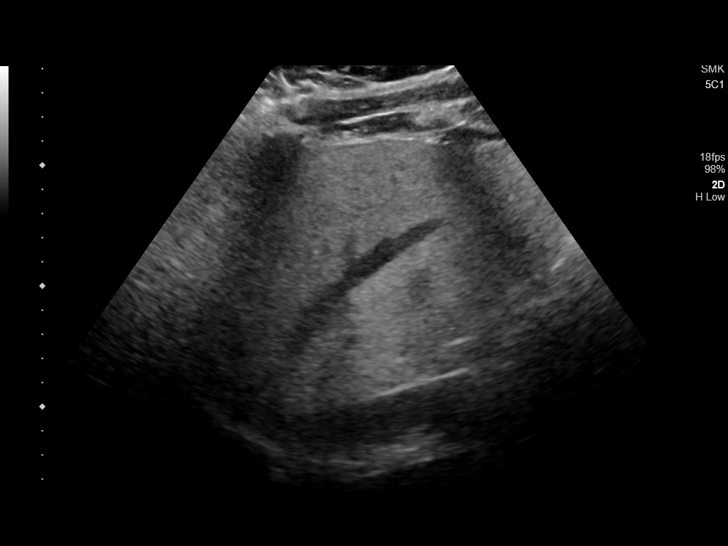
[im 42/56]
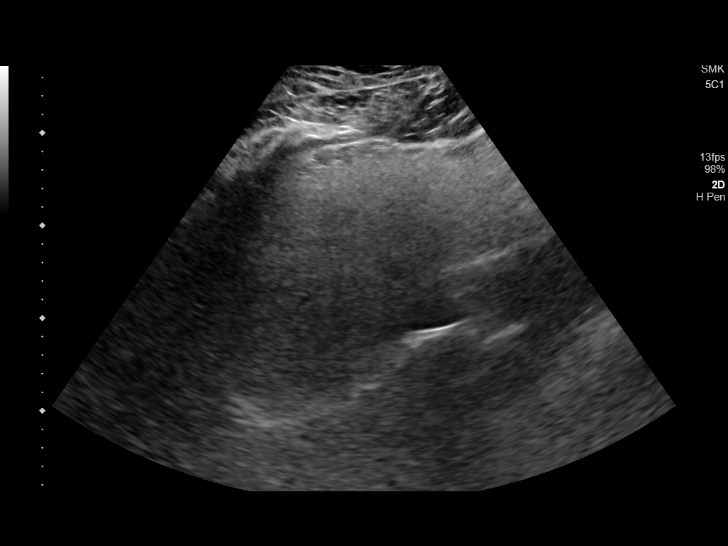
[im 46/56]
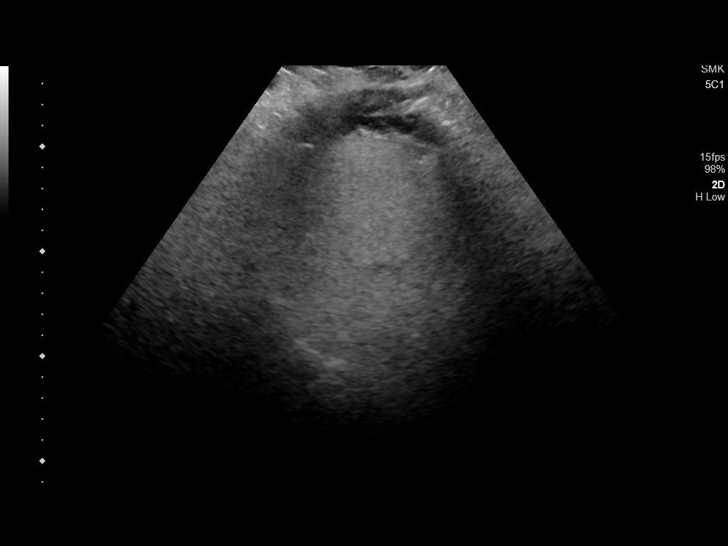
[im 51/56]
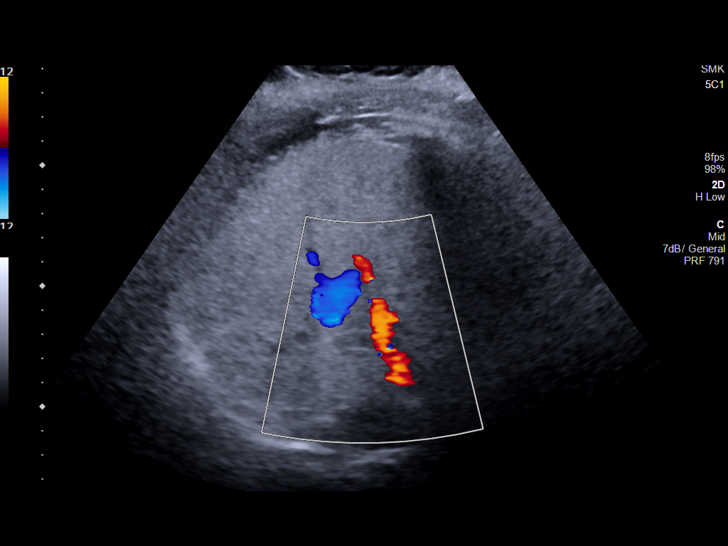
[im 56/56]
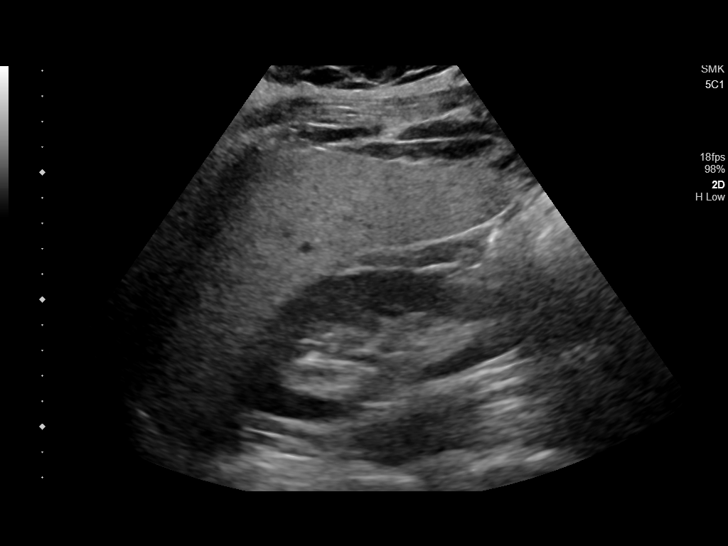

[14 of 25 positions shown; findings below may reference images not displayed]

FINDINGS: Gallbladder:

Gallbladder has a normal appearance. Gallbladder wall is
millimeters, within normal limits. No stones or pericholecystic
fluid. No sonographic Murphy's sign.

Common bile duct:

Diameter: 4.0 millimeters

Liver:

The liver is echogenic. There is attenuation of the ultrasound wave,
poor visualization of the internal hepatic architecture, and loss of
definition of the diaphragm. There is question mild nodularity along
the surface of the liver which can be seen in cirrhosis. There is a
focal area of relatively hypoechoic liver parenchyma adjacent to the
gallbladder fossa, likely representing focal fatty sparing. No
discrete focal lesion identified.

Portal vein is patent on color Doppler imaging with normal direction
of blood flow towards the liver.
IMPRESSION: 1. No evidence for acute cholecystitis.
2. Findings consistent with hepatic steatosis.
3. Question early cirrhotic changes.

## 2019-11-20 ENCOUNTER — Telehealth: Payer: Self-pay | Admitting: *Deleted

## 2019-11-20 NOTE — Telephone Encounter (Signed)

## 2019-11-23 ENCOUNTER — Telehealth: Payer: No Typology Code available for payment source | Admitting: Cardiology

## 2019-11-23 ENCOUNTER — Other Ambulatory Visit: Payer: Self-pay

## 2019-11-23 DIAGNOSIS — G4733 Obstructive sleep apnea (adult) (pediatric): Secondary | ICD-10-CM | POA: Diagnosis not present

## 2019-11-23 DIAGNOSIS — Z9989 Dependence on other enabling machines and devices: Secondary | ICD-10-CM | POA: Diagnosis not present

## 2019-11-23 DIAGNOSIS — I10 Essential (primary) hypertension: Secondary | ICD-10-CM

## 2019-11-23 NOTE — Progress Notes (Signed)
Cardiology Office Note:    Date:  11/23/2019   ID:  Adam [name], DOB 1975/10/06, MRN [id number]  PCP:  Sueanne Margarita, MD  Cardiologist:  No primary care provider on file.    Referring MD: [name], MD   CC:  OSA, HTN followup   History of Present Illness:    Adam [name] is a 44 y.o. male with a hx of presyncope, OSAand labile HTN.  He  is doing well with his CPAP device and thinks that he has gotten used to it.  He tolerates the mask and feels the pressure is adequate.  Since going on CPAP he feels rested in the am and has no significant daytime sleepiness.  He denies any significant mouth or nasal dryness or nasal congestion.  He does not think that he snores.  He thinks his is over 58 years old and would like to have a [name] device.  He denies any LE edema, dizziness, palpitation, chest pain, SOB or syncope.    Past Medical History:  Diagnosis Date  . Asthma    Childhood  . Chicken pox   . Chronic headaches   . Hypertension 2013  . Sleep apnea    on CPAP    Past Surgical History:  Procedure Laterality Date  . NO PAST SURGERIES      Current Medications: No outpatient medications have been marked as taking for the 11/23/19 encounter (Telemedicine) with Sueanne Margarita, MD.     Allergies:   Patient has no known allergies.   Social History   Socioeconomic History  . Marital status: Married    Spouse name: Adam [name]  . Number of children: 3  . Years of education: 72  . Highest education level: Not on file  Occupational History  . Occupation: Occupational hygienist: self employed    Comment: Owns Therapist, occupational  Tobacco Use  . Smoking status: Never Smoker  . Smokeless tobacco: Never Used  Substance and Sexual Activity  . Alcohol use: No  . Drug use: No  . Sexual activity: Yes  Other Topics Concern  . Not on file  Social History Narrative  . Not on file   Social Determinants of Health   Financial Resource Strain:   . Difficulty of Paying  Living Expenses: Not on file  Food Insecurity:   . Worried About Charity fundraiser in the Last Year: Not on file  . Ran Out of Food in the Last Year: Not on file  Transportation Needs:   . Lack of Transportation (Medical): Not on file  . Lack of Transportation (Non-Medical): Not on file  Physical Activity:   . Days of Exercise per Week: Not on file  . Minutes of Exercise per Session: Not on file  Stress:   . Feeling of Stress : Not on file  Social Connections:   . Frequency of Communication with Friends and Family: Not on file  . Frequency of Social Gatherings with Friends and Family: Not on file  . Attends Religious Services: Not on file  . Active Member of Clubs or Organizations: Not on file  . Attends Archivist Meetings: Not on file  . Marital Status: Not on file     Family History: The patient's family history includes Arthritis in his mother; Asthma in his sister; Diabetes in his brother, father, maternal grandfather, maternal grandmother, and sister; Heart disease in his sister; Heart disease (age of onset: 10) in  his maternal grandmother; Hypertension in his brother, father, and mother; Kidney disease in his maternal grandfather; Lupus in his sister.  ROS:   Please see the history of present illness.    ROS  All other systems reviewed and negative.   EKGs/Labs/Other Studies Reviewed:    The following studies were reviewed today: PAP compliance download from [facility]  EKG:  EKG is not ordered today.   Recent Labs: 01/13/2019: ALT 70; BUN 15; Creatinine, Ser 1.23; Potassium 4.0; Sodium 140   Recent Lipid Panel    Component Value Date/Time   CHOL 150 01/13/2019 1123   TRIG 100.0 01/13/2019 1123   HDL 40.10 01/13/2019 1123   CHOLHDL 4 01/13/2019 1123   VLDL 20.0 01/13/2019 1123   LDLCALC 90 01/13/2019 1123    Physical Exam:    VS:  There were no vitals taken for this visit.    Wt Readings from Last 3 Encounters:  08/18/18 (!) 319 lb (144.7 kg)    07/31/18 (!) 319 lb (144.7 kg)  07/08/18 (!) 319 lb 3.2 oz (144.8 kg)     GEN:  Well nourished, well developed in no acute distress HEENT: Normal NECK: No JVD; No carotid bruits LYMPHATICS: No lymphadenopathy CARDIAC: RRR, no murmurs, rubs, gallops RESPIRATORY:  Clear to auscultation without rales, wheezing or rhonchi  ABDOMEN: Soft, non-tender, non-distended MUSCULOSKELETAL:  No edema; No deformity  SKIN: Warm and dry NEUROLOGIC:  Alert and oriented x 3 PSYCHIATRIC:  Normal affect   ASSESSMENT:    1. OSA on CPAP   2. Essential hypertension   3. Class 2 severe obesity due to excess calories with serious comorbidity in adult, unspecified BMI ([place])    PLAN:    In order of problems listed above:  1.  OSA -  The patient is tolerating PAP therapy well without any problems. The PAP download was reviewed today and showed an AHI of 0.3/hr on 10 cm H2O with 87% compliance in using more than 4 hours nightly.  The patient has been using and benefiting from PAP use and will continue to benefit from therapy.   2.  HTN -continue Carvedilol 6.7m BID, HCTZ 213mdaily, Losartan 10070maily  3.  Morbid Obesity -I have encouraged him to get into a routine exercise program and cut back on carbs and portions.    Medication Adjustments/Labs and Tests Ordered: Current medicines are reviewed at length with the patient today.  Concerns regarding medicines are outlined above.  No orders of the defined types were placed in this encounter.  No orders of the defined types were placed in this encounter.   Signed, TraFransico HimD  11/23/2019 5:31 PM    [place]

## 2019-11-25 ENCOUNTER — Telehealth: Payer: Self-pay | Admitting: *Deleted

## 2019-11-25 DIAGNOSIS — G4733 Obstructive sleep apnea (adult) (pediatric): Secondary | ICD-10-CM

## 2019-11-25 DIAGNOSIS — Z9989 Dependence on other enabling machines and devices: Secondary | ICD-10-CM

## 2019-11-25 NOTE — Telephone Encounter (Signed)
-----   Message from Sueanne Margarita, MD sent at 11/23/2019  3:34 PM EST ----- Please order a new ResMed CPAP at 10cm H2O with heated humidity and please order a ResMed Airfit P30i.  Followup with me 8 weeks after getting new device.

## 2019-11-25 NOTE — Telephone Encounter (Signed)
Order placed to [facility] in [place] via community message.

## 2019-11-27 ENCOUNTER — Telehealth: Payer: Self-pay | Admitting: *Deleted

## 2019-11-27 NOTE — Telephone Encounter (Signed)
-----   Message from Sueanne Margarita, MD sent at 11/27/2019 10:45 AM EST ----- Good AHI and compliance.  Continue current PAP settings.

## 2019-11-27 NOTE — Telephone Encounter (Signed)
Informed patient of compliance results and verbalized understanding was indicated. Patient is aware and agreeable to AHI being within range at 0.3. Patient is aware and agreeable to being in compliance with machine usage. Patient is aware and agreeable to no change in current pressures.

## 2019-11-30 ENCOUNTER — Encounter: Payer: Self-pay | Admitting: Cardiology

## 2020-01-01 ENCOUNTER — Other Ambulatory Visit: Payer: Self-pay | Admitting: Internal Medicine

## 2020-01-20 ENCOUNTER — Other Ambulatory Visit: Payer: Self-pay

## 2020-01-20 ENCOUNTER — Telehealth (INDEPENDENT_AMBULATORY_CARE_PROVIDER_SITE_OTHER): Payer: No Typology Code available for payment source | Admitting: Cardiology

## 2020-01-20 ENCOUNTER — Encounter: Payer: Self-pay | Admitting: Cardiology

## 2020-01-20 VITALS — Ht 75.0 in | Wt 290.0 lb

## 2020-01-20 DIAGNOSIS — G4733 Obstructive sleep apnea (adult) (pediatric): Secondary | ICD-10-CM

## 2020-01-20 DIAGNOSIS — Z9989 Dependence on other enabling machines and devices: Secondary | ICD-10-CM | POA: Diagnosis not present

## 2020-01-20 DIAGNOSIS — I10 Essential (primary) hypertension: Secondary | ICD-10-CM | POA: Diagnosis not present

## 2020-01-20 NOTE — Progress Notes (Signed)
Virtual Visit via Telephone Note   This visit type was conducted due to national recommendations for restrictions regarding the COVID-19 Pandemic (e.g. social distancing) in an effort to limit this patient's exposure and mitigate transmission in our community.  Due to his co-morbid illnesses, this patient is at least at moderate risk for complications without adequate follow up.  This format is felt to be most appropriate for this patient at this time.  The patient did not have access to video technology/had technical difficulties with video requiring transitioning to audio format only (telephone).  All issues noted in this document were discussed and addressed.  No physical exam could be performed with this format.  Please refer to the patient's chart for his  consent to telehealth for [facility].   Evaluation Performed:  Follow-up visit  This visit type was conducted due to national recommendations for restrictions regarding the COVID-19 Pandemic (e.g. social distancing).  This format is felt to be most appropriate for this patient at this time.  All issues noted in this document were discussed and addressed.  No physical exam was performed (except for noted visual exam findings with Video Visits).  Please refer to the patient's chart (MyChart message for video visits and phone note for telephone visits) for the patient's consent to telehealth for [facility].  Date:  01/20/2020   ID:  Adam [name], DOB 10/30/1975, MRN [id number]  Patient Location:  Home  Provider location:   Lourena Simmonds  MVE:[facility] Sleep Medicine:  Fransico Him, MD Electrophysiologist:  None   Chief Complaint:  OSA  History of Present Illness:    Adam [name] is a 44 y.o. male who presents via audio/video conferencing for a telehealth visit today.    Adam [name] is a 44 y.o. male with a hx of presyncope, OSAand labile HTN. He recently saw he and wanted a [name] PCP device as his was old.  He is now here today  for followup per insurance requirements. He is doing well with his [name] CPAP device and thinks that he has gotten used to it.  He tolerates the mask and feels the pressure is adequate.  Since going on CPAP he feels rested in the am and has no significant daytime sleepiness.  He denies any significant mouth or nasal dryness or nasal congestion.  He does not think that he snores.    The patient does not have symptoms concerning for COVID-19 infection (fever, chills, cough, or [name] shortness of breath).   Prior CV studies:   The following studies were reviewed today:  PAP compliance download  Past Medical History:  Diagnosis Date  . Asthma    Childhood  . Chicken pox   . Chronic headaches   . Hypertension 2013  . OSA (obstructive sleep apnea)    moderate with AHI 15.5/hr 2014 home sleep study   Past Surgical History:  Procedure Laterality Date  . NO PAST SURGERIES       Current Meds  Medication Sig  . albuterol (PROVENTIL HFA;VENTOLIN HFA) 108 (90 Base) MCG/ACT inhaler Inhale 2 puffs into the lungs every 4 (four) hours as needed for wheezing.  Marland Kitchen aspirin 81 MG tablet Take 81 mg by mouth daily.  . carvedilol (COREG) 6.25 MG tablet Take 1 tablet (6.25 mg total) by mouth 2 (two) times daily with a meal.  . hydrochlorothiazide (HYDRODIURIL) 25 MG tablet TAKE 1 TABLET BY MOUTH DAILY.  Marland Kitchen losartan (COZAAR) 100 MG tablet Take 1 tablet (100 mg total)  by mouth at bedtime.  . Multiple Vitamins-Minerals (MULTIVITAMIN WITH MINERALS) tablet Take 1 tablet by mouth daily.     Allergies:   Patient has no known allergies.   Social History   Tobacco Use  . Smoking status: Never Smoker  . Smokeless tobacco: Never Used  Substance Use Topics  . Alcohol use: No  . Drug use: No     Family Hx: The patient's family history includes Arthritis in his mother; Asthma in his sister; Diabetes in his brother, father, maternal grandfather, maternal grandmother, and sister; Heart disease in his sister; Heart  disease (age of onset: 60) in his maternal grandmother; Hypertension in his brother, father, and mother; Kidney disease in his maternal grandfather; Lupus in his sister.  ROS:   Please see the history of present illness.     All other systems reviewed and are negative.   Labs/Other Tests and Data Reviewed:    Recent Labs: No results found for requested labs within last 8760 hours.   Recent Lipid Panel Lab Results  Component Value Date/Time   CHOL 150 01/13/2019 11:23 AM   TRIG 100.0 01/13/2019 11:23 AM   HDL 40.10 01/13/2019 11:23 AM   CHOLHDL 4 01/13/2019 11:23 AM   LDLCALC 90 01/13/2019 11:23 AM    Wt Readings from Last 3 Encounters:  01/20/20 290 lb (131.5 kg)  08/18/18 (!) 319 lb (144.7 kg)  07/31/18 (!) 319 lb (144.7 kg)     Objective:    Vital Signs:  Ht 6' 3"  (1.905 m)   Wt 290 lb (131.5 kg)   BMI 36.25 kg/m     ASSESSMENT & PLAN:    1.  OSA - The patient is tolerating PAP therapy well without any problems. The PAP download was reviewed today and showed an AHI of 0.6/hr on 10 cm H2O with 70% compliance in using more than 4 hours nightly.  The patient has been using and benefiting from PAP use and will continue to benefit from therapy.   2.  HTN -continue Carvedilol 6.54m BID, HCTZ 262mdaily and Losartan 10072maily  3.  Obesity -I have encouraged him to get into a routine exercise program and cut back on carbs and portions.     COVID-19 Education: The signs and symptoms of COVID-19 were discussed with the patient and how to seek care for testing (follow up with PCP or arrange E-visit).  The importance of social distancing was discussed today.  Patient Risk:   After full review of this patient's clinical status, I feel that they are at least moderate risk at this time.  Time:   Today, I have spent 20 minutes on telemedicine discussing medical problems including OSA, HTN, obesity and reviewing patient's chart including PAP compliance  download.  Medication Adjustments/Labs and Tests Ordered: Current medicines are reviewed at length with the patient today.  Concerns regarding medicines are outlined above.  Tests Ordered: No orders of the defined types were placed in this encounter.  Medication Changes: No orders of the defined types were placed in this encounter.   Disposition:  Follow up in 1 year(s)  Signed, TraFransico HimD  01/20/2020 9:25 AM    [facility]

## 2020-01-20 NOTE — Patient Instructions (Signed)
Medication Instructions:  Your physician recommends that you continue on your current medications as directed. Please refer to the Current Medication list given to you today.  *If you need a refill on your cardiac medications before your next appointment, please call your pharmacy*   Follow-Up: At [facility], you and your health needs are our priority.  As part of our continuing mission to provide you with exceptional heart care, we have created designated Provider Care Teams.  These Care Teams include your primary Cardiologist (physician) and Advanced Practice Providers (APPs -  Physician Assistants and Nurse Practitioners) who all work together to provide you with the care you need, when you need it.    Your next appointment:   1 year(s)  The format for your next appointment:   Virtual Visit   Provider:   Fransico Him, MD

## 2020-02-09 ENCOUNTER — Other Ambulatory Visit: Payer: Self-pay | Admitting: Internal Medicine

## 2020-04-07 ENCOUNTER — Other Ambulatory Visit: Payer: Self-pay | Admitting: Internal Medicine

## 2020-10-05 ENCOUNTER — Other Ambulatory Visit: Payer: Self-pay

## 2020-10-05 ENCOUNTER — Other Ambulatory Visit: Payer: Self-pay | Admitting: Internal Medicine

## 2020-10-05 MED ORDER — LOSARTAN POTASSIUM 100 MG PO TABS: 100.0000 mg | ORAL_TABLET | Freq: Every day | ORAL | 0 refills | Status: DC

## 2020-10-10 ENCOUNTER — Encounter: Payer: Self-pay | Admitting: Internal Medicine

## 2020-10-10 ENCOUNTER — Telehealth (INDEPENDENT_AMBULATORY_CARE_PROVIDER_SITE_OTHER): Payer: No Typology Code available for payment source | Admitting: Internal Medicine

## 2020-10-10 ENCOUNTER — Other Ambulatory Visit: Payer: Self-pay | Admitting: Internal Medicine

## 2020-10-10 VITALS — BP 130/88 | Ht 75.0 in | Wt 300.0 lb

## 2020-10-10 DIAGNOSIS — Z6837 Body mass index (BMI) 37.0-37.9, adult: Secondary | ICD-10-CM

## 2020-10-10 DIAGNOSIS — E8801 Alpha-1-antitrypsin deficiency: Secondary | ICD-10-CM | POA: Insufficient documentation

## 2020-10-10 DIAGNOSIS — I10 Essential (primary) hypertension: Secondary | ICD-10-CM

## 2020-10-10 DIAGNOSIS — E559 Vitamin D deficiency, unspecified: Secondary | ICD-10-CM

## 2020-10-10 DIAGNOSIS — K760 Fatty (change of) liver, not elsewhere classified: Secondary | ICD-10-CM

## 2020-10-10 DIAGNOSIS — Z125 Encounter for screening for malignant neoplasm of prostate: Secondary | ICD-10-CM

## 2020-10-10 DIAGNOSIS — R7303 Prediabetes: Secondary | ICD-10-CM

## 2020-10-10 DIAGNOSIS — Z1211 Encounter for screening for malignant neoplasm of colon: Secondary | ICD-10-CM

## 2020-10-10 MED ORDER — TELMISARTAN 80 MG PO TABS: 80.0000 mg | ORAL_TABLET | Freq: Every day | ORAL | 1 refills | Status: DC

## 2020-10-10 NOTE — Assessment & Plan Note (Signed)
Diagnosed by GI in 2020.  Weight loss,  Alcohol avoidance advised.

## 2020-10-10 NOTE — Assessment & Plan Note (Addendum)
I have addressed  BMI; he is following  low glycemic index diet and requesting something for appetite suppression.  Trial of metformin and metamucil pending review of labs ordered   I have also recommended that patient start exercising with a goal of 30 minutes of aerobic exercise a minimum of 5 days per week.
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
## 2020-10-10 NOTE — Assessment & Plan Note (Signed)
Goal is 120/70.  Changing losartan to telmisartan 80 mg daily . Continue hctz and carvedilol

## 2020-10-12 ENCOUNTER — Encounter: Payer: Self-pay | Admitting: General Practice

## 2020-10-12 ENCOUNTER — Encounter: Payer: Self-pay | Admitting: Gastroenterology

## 2020-10-12 ENCOUNTER — Telehealth: Payer: Self-pay

## 2020-10-12 ENCOUNTER — Telehealth: Payer: No Typology Code available for payment source

## 2020-10-12 NOTE — Telephone Encounter (Signed)
LVM for patient to call office back in regard to his 11:30am colonoscopy triage call.  Asked him to call office back to schedule.  Thanks,  [place], [place]

## 2020-10-13 ENCOUNTER — Other Ambulatory Visit: Payer: Self-pay

## 2020-10-13 ENCOUNTER — Telehealth (INDEPENDENT_AMBULATORY_CARE_PROVIDER_SITE_OTHER): Payer: Self-pay | Admitting: Gastroenterology

## 2020-10-13 DIAGNOSIS — Z1211 Encounter for screening for malignant neoplasm of colon: Secondary | ICD-10-CM

## 2020-10-13 MED ORDER — NA SULFATE-K SULFATE-MG SULF 17.5-3.13-1.6 GM/177ML PO SOLN: 1.0000 | Freq: Once | ORAL | 0 refills | Status: DC

## 2020-10-13 NOTE — Progress Notes (Signed)
Gastroenterology Pre-Procedure Review  Request Date: Tuesday 11/01/20 Requesting Physician: Dr. Allen Norris  PATIENT REVIEW QUESTIONS: The patient responded to the following health history questions as indicated:    1. Are you having any GI issues? no 2. Do you have a personal history of Polyps? no 3. Do you have a family history of Colon Cancer or Polyps? no 4. Diabetes Mellitus? no 5. Joint replacements in the past 12 months?no 6. Major health problems in the past 3 months?no 7. Any artificial heart valves, MVP, or defibrillator?no    MEDICATIONS & ALLERGIES:    Patient reports the following regarding taking any anticoagulation/antiplatelet therapy:   Plavix, Coumadin, Eliquis, Xarelto, Lovenox, Pradaxa, Brilinta, or Effient? no Aspirin? yes (81 mg daily)  Patient confirms/reports the following medications:  Current Outpatient Medications  Medication Sig Dispense Refill  . albuterol (PROVENTIL HFA;VENTOLIN HFA) 108 (90 Base) MCG/ACT inhaler Inhale 2 puffs into the lungs every 4 (four) hours as needed for wheezing. 1 Inhaler 2  . aspirin 81 MG tablet Take 81 mg by mouth daily.    . carvedilol (COREG) 6.25 MG tablet TAKE 1 TABLET BY MOUTH 2 TIMES DAILY WITH A MEAL. 180 tablet 2  . hydrochlorothiazide (HYDRODIURIL) 25 MG tablet TAKE 1 TABLET BY MOUTH DAILY. 90 tablet 3  . Multiple Vitamins-Minerals (MULTIVITAMIN WITH MINERALS) tablet Take 1 tablet by mouth daily.    Marland Kitchen telmisartan (MICARDIS) 80 MG tablet Take 1 tablet (80 mg total) by mouth daily. REPLACES LOSARTAN AT NEXT REFILL .  90 DAYS PLEASE 90 tablet 1   No current facility-administered medications for this visit.    Patient confirms/reports the following allergies:  No Known Allergies  No orders of the defined types were placed in this encounter.   AUTHORIZATION INFORMATION Primary Insurance: 1D#: Group #:  Secondary Insurance: 1D#: Group #:  SCHEDULE INFORMATION: Date: Tuesday 11/01/20 Time: Location:[facility]

## 2020-10-16 ENCOUNTER — Other Ambulatory Visit: Payer: Self-pay | Admitting: Internal Medicine

## 2020-10-16 DIAGNOSIS — Z1211 Encounter for screening for malignant neoplasm of colon: Secondary | ICD-10-CM

## 2020-10-18 ENCOUNTER — Other Ambulatory Visit: Payer: Self-pay

## 2020-10-18 ENCOUNTER — Other Ambulatory Visit (INDEPENDENT_AMBULATORY_CARE_PROVIDER_SITE_OTHER): Payer: No Typology Code available for payment source

## 2020-10-18 DIAGNOSIS — E559 Vitamin D deficiency, unspecified: Secondary | ICD-10-CM

## 2020-10-18 DIAGNOSIS — K760 Fatty (change of) liver, not elsewhere classified: Secondary | ICD-10-CM

## 2020-10-18 DIAGNOSIS — Z125 Encounter for screening for malignant neoplasm of prostate: Secondary | ICD-10-CM | POA: Diagnosis not present

## 2020-10-18 DIAGNOSIS — I10 Essential (primary) hypertension: Secondary | ICD-10-CM

## 2020-10-18 DIAGNOSIS — E8801 Alpha-1-antitrypsin deficiency: Secondary | ICD-10-CM | POA: Diagnosis not present

## 2020-10-18 DIAGNOSIS — R7303 Prediabetes: Secondary | ICD-10-CM | POA: Diagnosis not present

## 2020-10-18 DIAGNOSIS — Z6837 Body mass index (BMI) 37.0-37.9, adult: Secondary | ICD-10-CM

## 2020-10-18 LAB — COMPREHENSIVE METABOLIC PANEL
ALT: 141 U/L — ABNORMAL HIGH (ref 0–53)
AST: 60 U/L — ABNORMAL HIGH (ref 0–37)
Albumin: 4.3 g/dL (ref 3.5–5.2)
Alkaline Phosphatase: 59 U/L (ref 39–117)
BUN: 16 mg/dL (ref 6–23)
CO2: 31 mEq/L (ref 19–32)
Calcium: 9.8 mg/dL (ref 8.4–10.5)
Chloride: 101 mEq/L (ref 96–112)
Creatinine, Ser: 1.31 mg/dL (ref 0.40–1.50)
GFR: 66.03 mL/min (ref >60.00)
Glucose, Bld: 102 mg/dL — ABNORMAL HIGH (ref 70–99)
Potassium: 3.8 mEq/L (ref 3.5–5.1)
Sodium: 140 mEq/L (ref 135–145)
Total Bilirubin: 1.2 mg/dL (ref 0.2–1.2)
Total Protein: 7.3 g/dL (ref 6.0–8.3)

## 2020-10-18 LAB — MICROALBUMIN / CREATININE URINE RATIO
Creatinine,U: 309.4 mg/dL
Microalb Creat Ratio: 1.6 mg/g (ref 0.0–30.0)
Microalb, Ur: 4.9 mg/dL — ABNORMAL HIGH (ref 0.0–1.9)

## 2020-10-18 LAB — LIPID PANEL
Cholesterol: 154 mg/dL (ref 0–200)
HDL: 40.9 mg/dL (ref >39.00)
LDL Cholesterol: 89 mg/dL (ref 0–99)
NonHDL: 113.31
Total CHOL/HDL Ratio: 4
Triglycerides: 120 mg/dL (ref 0.0–149.0)
VLDL: 24 mg/dL (ref 0.0–40.0)

## 2020-10-18 LAB — PSA: PSA: 0.52 ng/mL (ref 0.10–4.00)

## 2020-10-18 LAB — VITAMIN D 25 HYDROXY (VIT D DEFICIENCY, FRACTURES): VITD: 46.63 ng/mL (ref 30.00–100.00)

## 2020-10-18 LAB — HEMOGLOBIN A1C: Hgb A1c MFr Bld: 6.4 % (ref 4.6–6.5)

## 2020-10-19 NOTE — Addendum Note (Signed)
Addended by: [name] on: 10/19/2020 12:04 PM   Modules accepted: Orders

## 2020-10-19 NOTE — Progress Notes (Signed)
  I have reviewed your recent fasting labs and I have the following recommendations:  Your  a1c is now 0.1 pt away from diagnosing you with type 2 diabetes,  in spite of your good diet,  and your liver appears to be irritated or  inflamed currently , because your liver enzymes are elevated.  This is serious, because Over time this constant elevation  will lead a progression from fatty liver to cirrhosis.    I am sending you to a GI specialist to help address this with medication .  Regards,   Deborra Medina, MD

## 2020-10-28 ENCOUNTER — Other Ambulatory Visit: Payer: No Typology Code available for payment source | Attending: Gastroenterology

## 2020-11-01 ENCOUNTER — Encounter: Admission: RE | Payer: Self-pay | Source: Home / Self Care

## 2020-11-01 ENCOUNTER — Ambulatory Visit
Admission: RE | Admit: 2020-11-01 | Payer: No Typology Code available for payment source | Source: Home / Self Care | Admitting: Gastroenterology

## 2020-11-01 SURGERY — COLONOSCOPY WITH PROPOFOL
Anesthesia: General

## 2020-11-02 ENCOUNTER — Other Ambulatory Visit: Payer: Self-pay | Admitting: Internal Medicine

## 2020-12-08 ENCOUNTER — Ambulatory Visit: Payer: No Typology Code available for payment source | Admitting: Gastroenterology

## 2020-12-13 ENCOUNTER — Encounter: Payer: Self-pay | Admitting: Gastroenterology

## 2020-12-13 ENCOUNTER — Ambulatory Visit: Payer: No Typology Code available for payment source | Admitting: Gastroenterology

## 2020-12-13 ENCOUNTER — Other Ambulatory Visit: Payer: Self-pay

## 2020-12-13 VITALS — BP 149/88 | HR 71 | Temp 97.8°F | Ht 75.0 in | Wt 322.0 lb

## 2020-12-13 DIAGNOSIS — K760 Fatty (change of) liver, not elsewhere classified: Secondary | ICD-10-CM

## 2020-12-13 DIAGNOSIS — E8801 Alpha-1-antitrypsin deficiency: Secondary | ICD-10-CM

## 2020-12-13 DIAGNOSIS — R748 Abnormal levels of other serum enzymes: Secondary | ICD-10-CM

## 2020-12-13 NOTE — Patient Instructions (Signed)
Please go to [facility] and have your blood drawn for the CMP in 3 months from today before your follow up appointment with Dr. Bonna Gains.

## 2020-12-14 LAB — COMPREHENSIVE METABOLIC PANEL
ALT: 124 IU/L — ABNORMAL HIGH (ref 0–44)
AST: 52 IU/L — ABNORMAL HIGH (ref 0–40)
Albumin/Globulin Ratio: 1.7 (ref 1.2–2.2)
Albumin: 4.6 g/dL (ref 4.0–5.0)
Alkaline Phosphatase: 72 IU/L (ref 44–121)
BUN/Creatinine Ratio: 14 (ref 9–20)
BUN: 17 mg/dL (ref 6–24)
Bilirubin Total: 0.6 mg/dL (ref 0.0–1.2)
CO2: 24 mmol/L (ref 20–29)
Calcium: 9.7 mg/dL (ref 8.7–10.2)
Chloride: 99 mmol/L (ref 96–106)
Creatinine, Ser: 1.18 mg/dL (ref 0.76–1.27)
Globulin, Total: 2.7 g/dL (ref 1.5–4.5)
Glucose: 80 mg/dL (ref 65–99)
Potassium: 4.1 mmol/L (ref 3.5–5.2)
Sodium: 141 mmol/L (ref 134–144)
Total Protein: 7.3 g/dL (ref 6.0–8.5)
eGFR: 78 mL/min/{1.73_m2} (ref >59)

## 2020-12-14 NOTE — Progress Notes (Signed)
Adam [name] [address]  [place]  [place], [place] [address]  Main: [phone]  Fax: [phone]   Gastroenterology Consultation  Referring Provider:     [name], MD Primary Care Physician:  [name], MD Reason for Consultation:     Fatty liver        HPI:    Chief Complaint  Patient presents with  . Fatty liver, non-alcoholic    Patient does not drink alcohol at all.    Adam [name] is a 45 y.o. y/o male referred for consultation & management  by Dr. [name], MD.  This is a patient who was previously evaluated by Dr. Allen Norris and found to have alpha-1 antitrypsin deficiency and fatty liver.  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  Patient has had elevated transaminases for a while.  Patient was advised to work on weight loss with diet and exercise but has not been able to do so. The patient denies abdominal or flank pain, anorexia, nausea or vomiting, dysphagia, change in bowel habits or black or bloody stools or weight loss.  Patient denies any alcohol use whatsoever.  Denies any hepatotoxic drugs.  No family history of colon cancer.  No prior colonoscopy.  Past Medical History:  Diagnosis Date  . Asthma    Childhood  . Chicken pox   . Chronic headaches   . Hypertension 2013  . OSA (obstructive sleep apnea)    moderate with AHI 15.5/hr 2014 home sleep study    Past Surgical History:  Procedure Laterality Date  . NO PAST SURGERIES      Prior to Admission medications   Medication Sig Start Date End Date Taking? Authorizing Provider  albuterol (PROVENTIL HFA;VENTOLIN HFA) 108 (90 Base) MCG/ACT inhaler Inhale 2 puffs into the lungs every 4 (four) hours as needed for wheezing. 01/07/19  Yes [name], MD  aspirin 81 MG tablet Take 81 mg by mouth daily.   Yes [provider]  carvedilol (COREG) 6.25 MG tablet TAKE 1 TABLET BY MOUTH 2 TIMES DAILY WITH A MEAL. 04/07/20  Yes [name], MD  hydrochlorothiazide  (HYDRODIURIL) 25 MG tablet TAKE 1 TABLET BY MOUTH DAILY. 01/01/20  Yes [name], MD  Multiple Vitamins-Minerals (MULTIVITAMIN WITH MINERALS) tablet Take 1 tablet by mouth daily.   Yes [provider]  telmisartan (MICARDIS) 80 MG tablet Take 1 tablet (80 mg total) by mouth daily. REPLACES LOSARTAN AT NEXT REFILL .  [place] 10/10/20  Yes [name], MD    Family History  Problem Relation Age of Onset  . Arthritis Mother   . Hypertension Mother   . Hypertension Father   . Diabetes Father   . Diabetes Brother   . Hypertension Brother   . Diabetes Maternal Grandmother   . Heart disease Maternal Grandmother 38       massive MI  . Diabetes Maternal Grandfather   . Kidney disease Maternal Grandfather   . Heart disease Sister        cardiomyopathy from drug use  . Diabetes Sister   . Lupus Sister   . Asthma Sister      Social History   Tobacco Use  . Smoking status: Never Smoker  . Smokeless tobacco: Never Used  Vaping Use  . Vaping Use: Never used  Substance Use Topics  . Alcohol use: No  . Drug use: No    Allergies as of 12/13/2020  . (No  Known Allergies)    Review of Systems:    All systems reviewed and negative except where noted in HPI.   Physical Exam:  BP (!) 149/88   Pulse 71   Temp 97.8 F (36.6 C) (Oral)   Ht 6' 3"  (1.905 m)   Wt (!) 322 lb (146.1 kg)   BMI 40.25 kg/m  No LMP for male patient. Psych:  Alert and cooperative. Normal mood and affect. General:   Alert,  Well-developed, well-nourished, pleasant and cooperative in NAD Head:  Normocephalic and atraumatic. Eyes:  Sclera clear, no icterus.   Conjunctiva pink. Ears:  Normal auditory acuity. Nose:  No deformity, discharge, or lesions. Mouth:  No deformity or lesions,oropharynx pink & moist. Neck:  Supple; no masses or thyromegaly. Abdomen:  Normal bowel sounds.  No bruits.  Soft, non-tender and non-distended without masses, hepatosplenomegaly or hernias noted.  No guarding  or rebound tenderness.    Msk:  Symmetrical without gross deformities. Good, equal movement & strength bilaterally. Pulses:  Normal pulses noted. Extremities:  No clubbing or edema.  No cyanosis. Neurologic:  Alert and oriented x3;  grossly normal neurologically. Skin:  Intact without significant lesions or rashes. No jaundice. Lymph Nodes:  No significant cervical adenopathy. Psych:  Alert and cooperative. Normal mood and affect.   Labs: CBC    Component Value Date/Time   WBC 7.6 01/14/2015 1057   RBC 5.69 01/14/2015 1057   HGB 17.0 01/14/2015 1057   HCT 50.9 01/14/2015 1057   PLT 240.0 01/14/2015 1057   MCV 89.4 01/14/2015 1057   MCHC 33.5 01/14/2015 1057   RDW 12.9 01/14/2015 1057   LYMPHSABS 2.6 01/14/2015 1057   MONOABS 0.8 01/14/2015 1057   EOSABS 0.2 01/14/2015 1057   BASOSABS 0.0 01/14/2015 1057   CMP     Component Value Date/Time   NA 141 12/13/2020 1430   K 4.1 12/13/2020 1430   CL 99 12/13/2020 1430   CO2 24 12/13/2020 1430   GLUCOSE 80 12/13/2020 1430   GLUCOSE 102 (H) 10/18/2020 0835   BUN 17 12/13/2020 1430   CREATININE 1.18 12/13/2020 1430   CALCIUM 9.7 12/13/2020 1430   PROT 7.3 12/13/2020 1430   ALBUMIN 4.6 12/13/2020 1430   AST 52 (H) 12/13/2020 1430   ALT 124 (H) 12/13/2020 1430   ALKPHOS 72 12/13/2020 1430   BILITOT 0.6 12/13/2020 1430    Imaging Studies: No results found.  Assessment and Plan:   Adam [name] is a 45 y.o. y/o male has been referred for fatty liver  Patient has history of alpha-1 antitrypsin deficiency with single gene Z mutation  With fatty liver seen on his imaging before, I have encouraged weight loss, 7 to 10% with diet and exercise.  I have suggested a nutritionist referral, but patient would like to try diet measures including portion control and others at home before going to nutritionist.  He will let us know if he would like a referral in the future  Patient is also due for CRC screening.  He states Cologuard  test was ordered by PCP but he has not collected it yet.  We discussed colonoscopy versus Cologuard testing and after discussion, patient would like to think about which when he would want and discuss it with his wife and call us back if he would like colonoscopy for CRC screening.  If you would like to proceed with Cologuard he will go ahead and get that test done that his PCP has already ordered  Finding of fatty liver on imaging discussed with patient Diet, weight loss, and exercise encouraged along with avoiding hepatotoxic drugs including alcohol Risk of progression to cirrhosis if above measures are not instituted were discussed as well, and patient verbalized understanding  He is immune to hepatitis a and B based on previous blood work.  I will update other blood work that was not completed at the time of his previous visits and also repeat a right upper quadrant ultrasound  He does not have any skin or lung manifestations of alpha-1 antitrypsin deficiency.  He is not a smoker   Dr Adam [name]  Speech recognition software was used to dictate the above note.

## 2020-12-16 LAB — ANTI-SMOOTH MUSCLE ANTIBODY, IGG: Smooth Muscle Ab: 9 Units (ref 0–19)

## 2020-12-16 LAB — MITOCHONDRIAL ANTIBODIES: Mitochondrial Ab: <20 Units (ref 0.0–20.0)

## 2020-12-16 LAB — IGG: IgG (Immunoglobin G), Serum: 1328 mg/dL (ref 603–1613)

## 2020-12-16 LAB — ANTI-MICROSOMAL ANTIBODY LIVER / KIDNEY: LKM1 Ab: 1.1 Units (ref 0.0–20.0)

## 2020-12-16 LAB — HEPATITIS C ANTIBODY: Hep C Virus Ab: 0.2 s/co ratio (ref 0.0–0.9)

## 2020-12-26 LAB — COLOGUARD: Cologuard: NEGATIVE

## 2020-12-28 ENCOUNTER — Other Ambulatory Visit: Payer: Self-pay

## 2020-12-28 ENCOUNTER — Other Ambulatory Visit: Payer: Self-pay | Admitting: Internal Medicine

## 2020-12-28 MED ORDER — CARVEDILOL 6.25 MG PO TABS
ORAL_TABLET | Freq: Two times a day (BID) | ORAL | 2 refills | Status: DC
  Filled 2020-12-28: qty 180, 90d supply, fill #0
  Filled 2021-03-29: qty 180, 90d supply, fill #1
  Filled 2021-07-06: qty 180, 90d supply, fill #2

## 2020-12-28 MED ORDER — HYDROCHLOROTHIAZIDE 25 MG PO TABS
ORAL_TABLET | Freq: Every day | ORAL | 3 refills | Status: DC
  Filled 2020-12-28: qty 90, 90d supply, fill #0
  Filled 2021-03-29: qty 90, 90d supply, fill #1
  Filled 2021-07-06: qty 90, 90d supply, fill #2
  Filled 2021-10-05: qty 90, 90d supply, fill #3

## 2020-12-30 LAB — COLOGUARD: COLOGUARD: NEGATIVE

## 2021-01-04 ENCOUNTER — Telehealth: Payer: Self-pay

## 2021-01-04 DIAGNOSIS — R748 Abnormal levels of other serum enzymes: Secondary | ICD-10-CM

## 2021-01-04 NOTE — Telephone Encounter (Signed)
Called patient but I was not able to leave a voicemail since it was not set-up. I will send patient a MyChart message since I see that he is active.

## 2021-01-04 NOTE — Telephone Encounter (Signed)
-----   Message from Virgel Manifold, MD sent at 12/29/2020  1:05 PM EDT ----- Herb Grays please let the patient know, his liver enzymes remain elevated.  We need to repeat his right upper quadrant ultrasound for elevated liver enzymes, and potentially obtain a liver biopsy after that.  Please schedule right upper quadrant ultrasound  He was also going to let us know if he is interested in a screening colonoscopy, please ask him if he is agreeable, please schedule

## 2021-01-09 ENCOUNTER — Other Ambulatory Visit: Payer: Self-pay

## 2021-01-09 MED FILL — Telmisartan Tab 80 MG: ORAL | 90 days supply | Qty: 90 | Fill #0 | Status: CN

## 2021-01-11 ENCOUNTER — Telehealth: Payer: Self-pay | Admitting: Internal Medicine

## 2021-01-11 NOTE — Telephone Encounter (Signed)
Cologuard test was negative. MyChart message sent

## 2021-01-13 ENCOUNTER — Other Ambulatory Visit: Payer: Self-pay

## 2021-01-16 ENCOUNTER — Other Ambulatory Visit: Payer: Self-pay

## 2021-01-30 ENCOUNTER — Other Ambulatory Visit: Payer: Self-pay

## 2021-01-31 ENCOUNTER — Other Ambulatory Visit: Payer: Self-pay

## 2021-01-31 MED FILL — Telmisartan Tab 80 MG: ORAL | 90 days supply | Qty: 90 | Fill #0 | Status: AC

## 2021-02-21 ENCOUNTER — Ambulatory Visit: Payer: No Typology Code available for payment source | Attending: Gastroenterology

## 2021-03-15 ENCOUNTER — Ambulatory Visit: Payer: No Typology Code available for payment source | Admitting: Gastroenterology

## 2021-03-15 DIAGNOSIS — Z9989 Dependence on other enabling machines and devices: Secondary | ICD-10-CM

## 2021-03-21 ENCOUNTER — Other Ambulatory Visit: Payer: Self-pay

## 2021-03-21 ENCOUNTER — Encounter: Payer: Self-pay | Admitting: Gastroenterology

## 2021-03-21 ENCOUNTER — Ambulatory Visit (INDEPENDENT_AMBULATORY_CARE_PROVIDER_SITE_OTHER): Payer: No Typology Code available for payment source | Admitting: Gastroenterology

## 2021-03-21 VITALS — BP 161/81 | HR 70 | Temp 98.4°F | Wt 321.4 lb

## 2021-03-21 DIAGNOSIS — R748 Abnormal levels of other serum enzymes: Secondary | ICD-10-CM

## 2021-03-21 DIAGNOSIS — K760 Fatty (change of) liver, not elsewhere classified: Secondary | ICD-10-CM | POA: Diagnosis not present

## 2021-03-21 NOTE — Progress Notes (Signed)
Primary Care Physician: [name], MD  Primary Gastroenterologist:  Dr. [name]  Chief Complaint  Patient presents with   Follow-up    Elevated Liver Enzymes     HPI: Adam [name] is a 45 y.o. male here for follow-up of abnormal liver enzymes.  The patient states that he has not lost any weight and actually gained some weight.  The patient now comes to see me after seeing my partner for fatty liver back in March of this year.  At that time the patient was told about a possible nutritional consult but wanted to try on his own.  The patient is now stating that he is having trouble losing weight so that he would like to consider a referral to a nutritionist.  Past Medical History:  Diagnosis Date   Asthma    Childhood   Chicken pox    Chronic headaches    Hypertension 2013   OSA (obstructive sleep apnea)    moderate with AHI 15.5/hr 2014 home sleep study    Current Outpatient Medications  Medication Sig Dispense Refill   albuterol (PROVENTIL HFA;VENTOLIN HFA) 108 (90 Base) MCG/ACT inhaler Inhale 2 puffs into the lungs every 4 (four) hours as needed for wheezing. 1 Inhaler 2   aspirin 81 MG tablet Take 81 mg by mouth daily.     carvedilol (COREG) 6.25 MG tablet TAKE 1 TABLET BY MOUTH 2 TIMES DAILY WITH A MEAL. 180 tablet 2   hydrochlorothiazide (HYDRODIURIL) 25 MG tablet TAKE 1 TABLET BY MOUTH DAILY. 90 tablet 3   Multiple Vitamins-Minerals (MULTIVITAMIN WITH MINERALS) tablet Take 1 tablet by mouth daily.     telmisartan (MICARDIS) 80 MG tablet TAKE 1 TABLET BY MOUTH DAILY 90 tablet 1   No current facility-administered medications for this visit.    Allergies as of 03/21/2021   (No Known Allergies)    ROS:  General: Negative for anorexia, weight loss, fever, chills, fatigue, weakness. ENT: Negative for hoarseness, difficulty swallowing , nasal congestion. CV: Negative for chest pain, angina, palpitations, dyspnea on exertion, peripheral edema.  Respiratory:  Negative for dyspnea at rest, dyspnea on exertion, cough, sputum, wheezing.  GI: See history of present illness. GU:  Negative for dysuria, hematuria, urinary incontinence, urinary frequency, nocturnal urination.  Endo: Negative for unusual weight change.    Physical Examination:   BP (!) 161/81 (BP Location: Right Arm, Patient Position: Sitting, Cuff Size: Large)   Pulse 70   Temp 98.4 F (36.9 C) (Oral)   Wt (!) 321 lb 6.4 oz (145.8 kg)   SpO2 95%   BMI 40.17 kg/m   General: Well-nourished, well-developed in no acute distress.  Eyes: No icterus. Conjunctivae pink. Skin: Warm and dry, no jaundice.   Psych: Alert and cooperative, normal mood and affect.  Labs:    Imaging Studies: No results found.  Assessment and Plan:   Adam [name] is a 45 y.o. y/o male who comes in for follow-up for fatty liver.  The patient is also a carrier of 1 gene of alpha-1 antitrypsin deficiency.  The patient has been encouraged to try and lose weight.  The patient will now be referred to a nutritionist for guidance on weight loss.  The patient has been explained the plan and agrees with it.     [name], MD. Marval Regal    Note: This dictation was prepared with Dragon dictation along with smaller phrase technology. Any transcriptional errors that result from this process are unintentional.

## 2021-03-30 ENCOUNTER — Other Ambulatory Visit: Payer: Self-pay

## 2021-04-26 ENCOUNTER — Other Ambulatory Visit: Payer: Self-pay | Admitting: Internal Medicine

## 2021-04-27 ENCOUNTER — Other Ambulatory Visit: Payer: Self-pay

## 2021-04-27 MED ORDER — TELMISARTAN 80 MG PO TABS
ORAL_TABLET | Freq: Every day | ORAL | 1 refills | Status: DC
  Filled 2021-04-27: qty 90, 90d supply, fill #0
  Filled 2021-08-02: qty 90, 90d supply, fill #1

## 2021-06-12 ENCOUNTER — Other Ambulatory Visit: Payer: Self-pay

## 2021-06-12 MED ORDER — PENICILLIN V POTASSIUM 500 MG PO TABS
ORAL_TABLET | ORAL | 0 refills | Status: DC
  Filled 2021-06-12: qty 28, 7d supply, fill #0

## 2021-06-14 ENCOUNTER — Other Ambulatory Visit: Payer: Self-pay

## 2021-06-22 ENCOUNTER — Other Ambulatory Visit: Payer: Self-pay

## 2021-06-22 ENCOUNTER — Telehealth (INDEPENDENT_AMBULATORY_CARE_PROVIDER_SITE_OTHER): Payer: No Typology Code available for payment source | Admitting: Cardiology

## 2021-06-22 ENCOUNTER — Encounter: Payer: Self-pay | Admitting: Cardiology

## 2021-06-22 VITALS — Ht 75.0 in | Wt 290.0 lb

## 2021-06-22 DIAGNOSIS — I10 Essential (primary) hypertension: Secondary | ICD-10-CM | POA: Diagnosis not present

## 2021-06-22 DIAGNOSIS — G4733 Obstructive sleep apnea (adult) (pediatric): Secondary | ICD-10-CM | POA: Diagnosis not present

## 2021-06-22 DIAGNOSIS — Z9989 Dependence on other enabling machines and devices: Secondary | ICD-10-CM

## 2021-06-22 NOTE — Progress Notes (Signed)
Virtual Visit via Video Note   This visit type was conducted due to national recommendations for restrictions regarding the COVID-19 Pandemic (e.g. social distancing) in an effort to limit this patient's exposure and mitigate transmission in our community.  Due to his co-morbid illnesses, this patient is at least at moderate risk for complications without adequate follow up.  This format is felt to be most appropriate for this patient at this time.  All issues noted in this document were discussed and addressed.  No physical exam could be performed with this format.  Please refer to the patient's chart for his  consent to telehealth for [facility].   Evaluation Performed:  Follow-up visit  Date:  06/22/2021   ID:  Adam [name], DOB August 16, 1976, MRN [id number]  Patient Location:  Home  Provider location:   [facility] Simmonds  URK:[facility] Sleep Medicine:  Fransico Him, MD Electrophysiologist:  None   Chief Complaint:  OSA  History of Present Illness:    Adam [name] is a 45 y.o. male who presents via audio/video conferencing for a telehealth visit today.    Adam [name] is a 45 y.o. male with a hx of presyncope, OSA and labile HTN. He is doing well with his CPAP device and thinks that he has gotten used to it.  He tolerates the mask and feels the pressure is adequate.  Since going on CPAP he feels rested in the am and has no significant daytime sleepiness.  He denies any significant mouth or nasal dryness or nasal congestion.  He does not think that he snores.    The patient does not have symptoms concerning for COVID-19 infection (fever, chills, cough, or new shortness of breath).   Prior CV studies:   The following studies were reviewed today:  PAP compliance download  Past Medical History:  Diagnosis Date   Asthma    Childhood   Chicken pox    Chronic headaches    Hypertension 2013   OSA (obstructive sleep apnea)    moderate with AHI 15.5/hr 2014 home sleep study   Past  Surgical History:  Procedure Laterality Date   NO PAST SURGERIES       Current Meds  Medication Sig   albuterol (PROVENTIL HFA;VENTOLIN HFA) 108 (90 Base) MCG/ACT inhaler Inhale 2 puffs into the lungs every 4 (four) hours as needed for wheezing.   aspirin 81 MG tablet Take 81 mg by mouth daily.   carvedilol (COREG) 6.25 MG tablet TAKE 1 TABLET BY MOUTH 2 TIMES DAILY WITH A MEAL.   hydrochlorothiazide (HYDRODIURIL) 25 MG tablet TAKE 1 TABLET BY MOUTH DAILY.   Multiple Vitamins-Minerals (MULTIVITAMIN WITH MINERALS) tablet Take 1 tablet by mouth daily.   penicillin v potassium (VEETID) 500 MG tablet TAKE 2 TABLETS BY MOUTH NOW, THEN ONE TABLET EVERY 6 HOURS UNTIL GONE   telmisartan (MICARDIS) 80 MG tablet TAKE 1 TABLET BY MOUTH DAILY   [DISCONTINUED] losartan (COZAAR) 100 MG tablet Take 1 tablet (100 mg total) by mouth at bedtime.     Allergies:   Patient has no known allergies.   Social History   Tobacco Use   Smoking status: Never   Smokeless tobacco: Never  Vaping Use   Vaping Use: Never used  Substance Use Topics   Alcohol use: No   Drug use: No     Family Hx: The patient's family history includes Arthritis in his mother; Asthma in his sister; Diabetes in his brother, father, maternal grandfather, maternal grandmother, and  sister; Heart disease in his sister; Heart disease (age of onset: 70) in his maternal grandmother; Hypertension in his brother, father, and mother; Kidney disease in his maternal grandfather; Lupus in his sister.  ROS:   Please see the history of present illness.     All other systems reviewed and are negative.   Labs/Other Tests and Data Reviewed:    Recent Labs: 12/13/2020: ALT 124; BUN 17; Creatinine, Ser 1.18; Potassium 4.1; Sodium 141   Recent Lipid Panel Lab Results  Component Value Date/Time   CHOL 154 10/18/2020 08:35 AM   TRIG 120.0 10/18/2020 08:35 AM   HDL 40.90 10/18/2020 08:35 AM   CHOLHDL 4 10/18/2020 08:35 AM   LDLCALC 89  10/18/2020 08:35 AM    Wt Readings from Last 3 Encounters:  06/22/21 290 lb (131.5 kg)  03/21/21 (!) 321 lb 6.4 oz (145.8 kg)  12/13/20 (!) 322 lb (146.1 kg)     Objective:    Vital Signs:  Ht 6' 3"  (1.905 m)   Wt 290 lb (131.5 kg)   BMI 36.25 kg/m    Well nourished, well developed male in no acute distress. Well appearing, alert and conversant, regular work of breathing,  good skin color  Eyes- anicteric mouth- oral mucosa is pink  neuro- grossly intact skin- no apparent rash or lesions or cyanosis  ASSESSMENT & PLAN:    1.  OSA - The patient is tolerating PAP therapy well without any problems. The PAP download performed by his DME was personally reviewed and interpreted by me today and showed an AHI of 0.5/hr on 10 cm H2O with 90% compliance in using more than 4 hours nightly.  The patient has been using and benefiting from PAP use and will continue to benefit from therapy.    2.  HTN -Continue prescription drug management with Carvedilol 6.81m BID, HCTZ 220mdaily and Telmisartan 8068maily with PRN refills  -I have recommended checking BP once weekly to keep tabs on it  3.  Obesity -he is exercising and has lost 30lbs!!!  I congratulated him   COVID-19 Education: The signs and symptoms of COVID-19 were discussed with the patient and how to seek care for testing (follow up with PCP or arrange E-visit).  The importance of social distancing was discussed today.  Patient Risk:   After full review of this patient's clinical status, I feel that they are at least moderate risk at this time.  Time:   Today, I have spent 20 minutes on telemedicine discussing medical problems including OSA, HTN, obesity and reviewing patient's chart including PAP compliance download.  Medication Adjustments/Labs and Tests Ordered: Current medicines are reviewed at length with the patient today.  Concerns regarding medicines are outlined above.  Tests Ordered: No orders of the defined types  were placed in this encounter.  Medication Changes: No orders of the defined types were placed in this encounter.   Disposition:  Follow up in 1 year(s)  Signed, TraFransico HimD  06/22/2021 10:24 AM    [facility]

## 2021-06-22 NOTE — Patient Instructions (Signed)

## 2021-07-06 ENCOUNTER — Other Ambulatory Visit: Payer: Self-pay

## 2021-08-03 ENCOUNTER — Other Ambulatory Visit: Payer: Self-pay

## 2021-09-19 ENCOUNTER — Ambulatory Visit: Payer: No Typology Code available for payment source | Admitting: Internal Medicine

## 2021-09-26 ENCOUNTER — Telehealth (INDEPENDENT_AMBULATORY_CARE_PROVIDER_SITE_OTHER): Payer: No Typology Code available for payment source | Admitting: Internal Medicine

## 2021-09-26 ENCOUNTER — Other Ambulatory Visit: Payer: Self-pay

## 2021-09-26 ENCOUNTER — Encounter: Payer: Self-pay | Admitting: Internal Medicine

## 2021-09-26 VITALS — Ht 75.0 in | Wt 283.0 lb

## 2021-09-26 DIAGNOSIS — E785 Hyperlipidemia, unspecified: Secondary | ICD-10-CM | POA: Diagnosis not present

## 2021-09-26 DIAGNOSIS — L603 Nail dystrophy: Secondary | ICD-10-CM

## 2021-09-26 DIAGNOSIS — L918 Other hypertrophic disorders of the skin: Secondary | ICD-10-CM

## 2021-09-26 DIAGNOSIS — I10 Essential (primary) hypertension: Secondary | ICD-10-CM | POA: Diagnosis not present

## 2021-09-26 DIAGNOSIS — E1169 Type 2 diabetes mellitus with other specified complication: Secondary | ICD-10-CM

## 2021-09-26 DIAGNOSIS — K760 Fatty (change of) liver, not elsewhere classified: Secondary | ICD-10-CM

## 2021-09-26 DIAGNOSIS — E66812 Obesity, class 2: Secondary | ICD-10-CM

## 2021-09-26 DIAGNOSIS — E118 Type 2 diabetes mellitus with unspecified complications: Secondary | ICD-10-CM

## 2021-09-26 DIAGNOSIS — Z125 Encounter for screening for malignant neoplasm of prostate: Secondary | ICD-10-CM

## 2021-09-26 DIAGNOSIS — Z6837 Body mass index (BMI) 37.0-37.9, adult: Secondary | ICD-10-CM

## 2021-09-26 MED ORDER — OZEMPIC (0.25 OR 0.5 MG/DOSE) 2 MG/1.5ML ~~LOC~~ SOPN
0.2500 mg | PEN_INJECTOR | SUBCUTANEOUS | 2 refills | Status: DC
Start: 2021-09-26 — End: 2021-10-19
  Filled 2021-09-26: qty 1.5, 56d supply, fill #0

## 2021-09-26 NOTE — Assessment & Plan Note (Signed)
He has lost 30 lbs on his own sieth diet and exercise.  Adding Mounjaro

## 2021-09-26 NOTE — Assessment & Plan Note (Addendum)
Complicated by hypertension fatty liver, obesity and OSA.    Recommend starting ozempic

## 2021-09-26 NOTE — Assessment & Plan Note (Addendum)
The possibility of alpha 1 antitrypsin based on previously reported labs in October was discussed with patient.  He ha been referred to Dr Allen Norris for  additional testing and a liver biopsy is planned

## 2021-09-26 NOTE — Progress Notes (Signed)
Virtual Visit via [place] Note  This visit type was conducted due to national recommendations for restrictions regarding the COVID-19 pandemic (e.g. social distancing).  This format is felt to be most appropriate for this patient at this time.  All issues noted in this document were discussed and addressed.  No physical exam was performed (except for noted visual exam findings with Video Visits).   I connected withNAME@ on 09/26/21 at  4:00 PM EST by a video enabled telemedicine application and verified that I am speaking with the correct person using two identifiers. Location patient: home Location provider: work or home office Persons participating in the virtual visit: patient, provider  I discussed the limitations, risks, security and privacy concerns of performing an evaluation and management service by telephone and the availability of in person appointments. I also discussed with the patient that there may be a patient responsible charge related to this service. The patient expressed understanding and agreed to proceed.  Reason for visit: follow up on morbid obesity with type 2 DM    HPI:  46 yr old male with type 2 DM (hgba1c 6.4) , BMI > 40. Hypertension, hepatic steatosis  and OSA presents for follow up.  He has lost 30 lbs through careful diet and exercise over the last 6 months and requests pharmacotherapy to provide additional support .  He has no history of thryoic CA or pancreatitis and there is no FH of thyroid ca .  2) increased skin tag growth around his neck and under both axillae.    3) abnormal appearance of toenails on left foot.  Started with great toe after remote trauma to nail bed.  Nails have become thickened and yellow   ROS: See pertinent positives and negatives per HPI.  Past Medical History:  Diagnosis Date   Asthma    Childhood   Chicken pox    Chronic headaches    Hypertension 2013   OSA (obstructive sleep apnea)    moderate with AHI 15.5/hr 2014  home sleep study    Past Surgical History:  Procedure Laterality Date   NO PAST SURGERIES      Family History  Problem Relation Age of Onset   Arthritis Mother    Hypertension Mother    Hypertension Father    Diabetes Father    Diabetes Brother    Hypertension Brother    Diabetes Maternal Grandmother    Heart disease Maternal Grandmother 59       massive MI   Diabetes Maternal Grandfather    Kidney disease Maternal Grandfather    Heart disease Sister        cardiomyopathy from drug use   Diabetes Sister    Lupus Sister    Asthma Sister     SOCIAL HX:  reports that he has never smoked. He has never used smokeless tobacco. He reports that he does not drink alcohol and does not use drugs.    Current Outpatient Medications:    albuterol (PROVENTIL HFA;VENTOLIN HFA) 108 (90 Base) MCG/ACT inhaler, Inhale 2 puffs into the lungs every 4 (four) hours as needed for wheezing., Disp: 1 Inhaler, Rfl: 2   aspirin 81 MG tablet, Take 81 mg by mouth daily., Disp: , Rfl:    carvedilol (COREG) 6.25 MG tablet, TAKE 1 TABLET BY MOUTH 2 TIMES DAILY WITH A MEAL., Disp: 180 tablet, Rfl: 2   hydrochlorothiazide (HYDRODIURIL) 25 MG tablet, TAKE 1 TABLET BY MOUTH DAILY., Disp: 90 tablet, Rfl: 3   Multiple Vitamins-Minerals (  MULTIVITAMIN WITH MINERALS) tablet, Take 1 tablet by mouth daily., Disp: , Rfl:    Semaglutide,0.25 or 0.5MG/DOS, (OZEMPIC, 0.25 OR 0.5 MG/DOSE,) 2 MG/1.5ML SOPN, Inject 0.25 mg into the skin once a week., Disp: 1.5 mL, Rfl: 2   telmisartan (MICARDIS) 80 MG tablet, TAKE 1 TABLET BY MOUTH DAILY, Disp: 90 tablet, Rfl: 1  EXAM:  VITALS per patient if applicable:  GENERAL: alert, oriented, appears well and in no acute distress  HEENT: atraumatic, conjunttiva clear, no obvious abnormalities on inspection of external nose and ears  NECK: normal movements of the head and neck  LUNGS: on inspection no signs of respiratory distress, breathing rate appears normal, no obvious gross  SOB, gasping or wheezing  CV: no obvious cyanosis  MS: moves all visible extremities without noticeable abnormality  PSYCH/NEURO: pleasant and cooperative, no obvious depression or anxiety, speech and thought processing grossly intact  ASSESSMENT AND PLAN:  Discussed the following assessment and plan:  Primary hypertension - Plan: Comprehensive metabolic panel, Microalbumin / creatinine urine ratio, Semaglutide,0.25 or 0.5MG/DOS, (OZEMPIC, 0.25 OR 0.5 MG/DOSE,) 2 MG/1.5ML SOPN  Fatty liver disease, nonalcoholic - Plan: Comprehensive metabolic panel, [id number],6.75 or 0.5MG/DOS, (OZEMPIC, 0.25 OR 0.5 MG/DOSE,) 2 MG/1.5ML SOPN  Controlled type 2 diabetes mellitus with complication, without long-term current use of insulin (HCC)  Hyperlipidemia, unspecified hyperlipidemia type - Plan: Lipid panel, Semaglutide,0.25 or 0.5MG/DOS, (OZEMPIC, 0.25 OR 0.5 MG/DOSE,) 2 MG/1.5ML SOPN  Prostate cancer screening - Plan: PSA, Medicare ( [place] only), CANCELED: PSA, Medicare ( [place] only)  Nail dystrophy - Plan: Ambulatory referral to Dermatology  Skin tags, multiple acquired - Plan: Ambulatory referral to Dermatology  Type 2 diabetes mellitus with other specified complication, without long-term current use of insulin ([place]) - Plan: Comprehensive metabolic panel, Hemoglobin A1c, Microalbumin / creatinine urine ratio, Semaglutide,0.25 or 0.5MG/DOS, (OZEMPIC, 0.25 OR 0.5 MG/DOSE,) 2 MG/1.5ML SOPN  Class 2 severe obesity due to excess calories with serious comorbidity and body mass index (BMI) of 37.0 to 37.9 in adult [facility])  Class 2 severe obesity due to excess calories with serious comorbidity in adult [facility]) He has lost 30 lbs on his own sieth diet and exercise.  Adding Mounjaro   Controlled diabetes mellitus type 2 with complications ([place]) Complicated by hypertension fatty liver, obesity and OSA.    Recommend starting ozempic  Fatty liver disease, nonalcoholic The possibility  of alpha 1 antitrypsin based on previously reported labs in October was discussed with patient.  He ha been referred to Dr Allen Norris for  additional testing and a liver biopsy is planned     I discussed the assessment and treatment plan with the patient. The patient was provided an opportunity to ask questions and all were answered. The patient agreed with the plan and demonstrated an understanding of the instructions.   The patient was advised to call back or seek an in-person evaluation if the symptoms worsen or if the condition fails to improve as anticipated.   I spent 30 minutes dedicated to the care of this patient on the date of this encounter to include pre-visit review of his medical history,  Face-to-face time with the patient , and post visit ordering of testing and therapeutics.    [name], MD

## 2021-09-26 NOTE — Patient Instructions (Signed)
I am  recommending adding Ozempic  to help you lose weight  and have given you a sample pen that contains 6  WEEKLY doses at the starting dose of 0.25 mg   (THIS IS THE SAME MEDICATION LISA IS USING,  BUT AT A LOWER DOSE TO START)  ozempic is a medication that is taken as a weekly subcutaneous injection. It is not insulin.  It  causes your pancreas to increase its  own insulin secretion  And also slows down the emptying of your stomach,  So it decreases your appetite and helps you lose weight.  The dose for the first 4 weekly doses is 0.25 mg.  You may have mild nausea on the first or second day but this should resolve.  If not  ,  stop the medication.   As long as you are losing weight,  you can continue the dose you are on .  Only increase the dose to 0.5 mg after 4 weeks if your weight has plateaued.  Let me know when you need a refill and what dose you are taking  If the copay is too high,  let me know and I will try the alternative (mounjaro) that works the same way  Dermatology referral needed   Labs needed asap

## 2021-09-27 ENCOUNTER — Telehealth: Payer: Self-pay | Admitting: Pharmacist

## 2021-09-27 NOTE — Telephone Encounter (Signed)
Received fax from [place] is requiring step therapy. For Ozempic to be covered, patient must have "trial of metformin, sulfonylurea, or pioglitazone and diagnosis of T2DM".   Does not appear there has been a trial of metformin previously. Routing to PCP.

## 2021-10-02 ENCOUNTER — Other Ambulatory Visit: Payer: Self-pay

## 2021-10-05 ENCOUNTER — Other Ambulatory Visit: Payer: Self-pay | Admitting: Internal Medicine

## 2021-10-05 ENCOUNTER — Other Ambulatory Visit: Payer: Self-pay

## 2021-10-05 MED ORDER — CARVEDILOL 6.25 MG PO TABS
ORAL_TABLET | Freq: Two times a day (BID) | ORAL | 2 refills | Status: DC
  Filled 2021-10-05: qty 180, 90d supply, fill #0
  Filled 2022-01-02: qty 180, 90d supply, fill #1
  Filled 2022-04-18: qty 180, 90d supply, fill #2

## 2021-10-06 ENCOUNTER — Other Ambulatory Visit: Payer: Self-pay

## 2021-10-10 ENCOUNTER — Other Ambulatory Visit (INDEPENDENT_AMBULATORY_CARE_PROVIDER_SITE_OTHER): Payer: No Typology Code available for payment source

## 2021-10-10 ENCOUNTER — Other Ambulatory Visit: Payer: Self-pay

## 2021-10-10 DIAGNOSIS — E1169 Type 2 diabetes mellitus with other specified complication: Secondary | ICD-10-CM | POA: Diagnosis not present

## 2021-10-10 DIAGNOSIS — K760 Fatty (change of) liver, not elsewhere classified: Secondary | ICD-10-CM | POA: Diagnosis not present

## 2021-10-10 DIAGNOSIS — E785 Hyperlipidemia, unspecified: Secondary | ICD-10-CM | POA: Diagnosis not present

## 2021-10-10 DIAGNOSIS — I10 Essential (primary) hypertension: Secondary | ICD-10-CM

## 2021-10-10 DIAGNOSIS — Z125 Encounter for screening for malignant neoplasm of prostate: Secondary | ICD-10-CM

## 2021-10-10 LAB — LIPID PANEL
Cholesterol: 142 mg/dL (ref 0–200)
HDL: 41.3 mg/dL (ref >39.00)
LDL Cholesterol: 62 mg/dL (ref 0–99)
NonHDL: 100.87
Total CHOL/HDL Ratio: 3
Triglycerides: 193 mg/dL — ABNORMAL HIGH (ref 0.0–149.0)
VLDL: 38.6 mg/dL (ref 0.0–40.0)

## 2021-10-10 LAB — COMPREHENSIVE METABOLIC PANEL
ALT: 83 U/L — ABNORMAL HIGH (ref 0–53)
AST: 34 U/L (ref 0–37)
Albumin: 4.1 g/dL (ref 3.5–5.2)
Alkaline Phosphatase: 60 U/L (ref 39–117)
BUN: 17 mg/dL (ref 6–23)
CO2: 34 mEq/L — ABNORMAL HIGH (ref 19–32)
Calcium: 9.7 mg/dL (ref 8.4–10.5)
Chloride: 99 mEq/L (ref 96–112)
Creatinine, Ser: 1.22 mg/dL (ref 0.40–1.50)
GFR: 71.43 mL/min (ref >60.00)
Glucose, Bld: 149 mg/dL — ABNORMAL HIGH (ref 70–99)
Potassium: 3.7 mEq/L (ref 3.5–5.1)
Sodium: 138 mEq/L (ref 135–145)
Total Bilirubin: 0.8 mg/dL (ref 0.2–1.2)
Total Protein: 7.1 g/dL (ref 6.0–8.3)

## 2021-10-10 LAB — MICROALBUMIN / CREATININE URINE RATIO
Creatinine,U: 159.7 mg/dL
Microalb Creat Ratio: 1.7 mg/g (ref 0.0–30.0)
Microalb, Ur: 2.6 mg/dL — ABNORMAL HIGH (ref 0.0–1.9)

## 2021-10-10 LAB — PSA, MEDICARE: PSA: 0.51 ng/ml (ref 0.10–4.00)

## 2021-10-10 LAB — HEMOGLOBIN A1C: Hgb A1c MFr Bld: 6.3 % (ref 4.6–6.5)

## 2021-10-13 ENCOUNTER — Telehealth: Payer: Self-pay

## 2021-10-13 NOTE — Telephone Encounter (Signed)
LMTCB in regards to lab results.

## 2021-10-19 ENCOUNTER — Telehealth: Payer: Self-pay

## 2021-10-19 ENCOUNTER — Other Ambulatory Visit: Payer: Self-pay

## 2021-10-19 MED ORDER — SEMAGLUTIDE-WEIGHT MANAGEMENT 2.4 MG/0.75ML ~~LOC~~ SOAJ
2.4000 mg | SUBCUTANEOUS | 2 refills | Status: DC
  Filled 2021-10-19: qty 3, fill #0

## 2021-10-19 MED ORDER — SEMAGLUTIDE-WEIGHT MANAGEMENT 1 MG/0.5ML ~~LOC~~ SOAJ
1.0000 mg | SUBCUTANEOUS | 0 refills | Status: AC
  Filled 2021-10-19: qty 2, 28d supply, fill #0

## 2021-10-19 MED ORDER — SEMAGLUTIDE-WEIGHT MANAGEMENT 1.7 MG/0.75ML ~~LOC~~ SOAJ
1.7000 mg | SUBCUTANEOUS | 0 refills | Status: AC
  Filled 2021-10-19 – 2022-02-05 (×2): qty 3, 28d supply, fill #0

## 2021-10-19 MED ORDER — SEMAGLUTIDE-WEIGHT MANAGEMENT 0.25 MG/0.5ML ~~LOC~~ SOAJ
0.2500 mg | SUBCUTANEOUS | 0 refills | Status: AC
  Filled 2021-10-19 – 2021-10-27 (×3): qty 2, 28d supply, fill #0

## 2021-10-19 MED ORDER — SEMAGLUTIDE-WEIGHT MANAGEMENT 0.5 MG/0.5ML ~~LOC~~ SOAJ
0.5000 mg | SUBCUTANEOUS | 0 refills | Status: AC
  Filled 2021-10-19 – 2022-02-13 (×3): qty 2, 28d supply, fill #0

## 2021-10-19 NOTE — Telephone Encounter (Signed)
Ozempic resent as [employer]

## 2021-10-19 NOTE — Telephone Encounter (Signed)
Received a fax from [facility] [place] stating that before pt can fill the Ozempic he has to do a step therapy with [facility]. If this okay a rx will need to be sent to pharmacy.

## 2021-10-19 NOTE — Addendum Note (Signed)
Addended by: [name] on: 10/19/2021 04:49 PM   Modules accepted: Orders

## 2021-10-20 ENCOUNTER — Other Ambulatory Visit: Payer: Self-pay

## 2021-10-20 NOTE — Telephone Encounter (Signed)
Spoke with pt to let him know that the [facility] has been sent in. Also advised pt that if this requires a PA to please give [place] a call so we can get started on it. Pt gave a verbal understanding.

## 2021-10-24 ENCOUNTER — Telehealth: Payer: Self-pay

## 2021-10-24 NOTE — Telephone Encounter (Signed)
PA for [facility] has been submitted on covermymeds.

## 2021-10-26 NOTE — Telephone Encounter (Signed)
PA has been approved through 01/21/2022. Pt is aware.

## 2021-10-27 ENCOUNTER — Other Ambulatory Visit: Payer: Self-pay

## 2021-11-01 ENCOUNTER — Other Ambulatory Visit: Payer: Self-pay

## 2021-11-01 ENCOUNTER — Other Ambulatory Visit: Payer: Self-pay | Admitting: Internal Medicine

## 2021-11-01 MED ORDER — TELMISARTAN 80 MG PO TABS
ORAL_TABLET | Freq: Every day | ORAL | 1 refills | Status: DC
  Filled 2021-11-01: qty 90, 90d supply, fill #0
  Filled 2022-01-30: qty 90, 90d supply, fill #1

## 2021-11-07 ENCOUNTER — Other Ambulatory Visit: Payer: Self-pay

## 2022-01-02 ENCOUNTER — Other Ambulatory Visit: Payer: Self-pay | Admitting: Internal Medicine

## 2022-01-02 ENCOUNTER — Other Ambulatory Visit: Payer: Self-pay

## 2022-01-02 MED FILL — Hydrochlorothiazide Tab 25 MG: ORAL | 90 days supply | Qty: 90 | Fill #0 | Status: AC

## 2022-01-16 ENCOUNTER — Other Ambulatory Visit: Payer: Self-pay

## 2022-01-31 ENCOUNTER — Other Ambulatory Visit: Payer: Self-pay

## 2022-02-05 ENCOUNTER — Other Ambulatory Visit: Payer: Self-pay

## 2022-02-06 ENCOUNTER — Other Ambulatory Visit: Payer: Self-pay

## 2022-02-07 ENCOUNTER — Telehealth: Payer: Self-pay

## 2022-02-07 NOTE — Telephone Encounter (Signed)
PA for [employer] 0.5 mg has been submitted on covermymeds.  ?

## 2022-02-13 ENCOUNTER — Other Ambulatory Visit: Payer: Self-pay

## 2022-02-13 NOTE — Telephone Encounter (Signed)
PA has been approved

## 2022-02-14 ENCOUNTER — Other Ambulatory Visit: Payer: Self-pay

## 2022-02-21 ENCOUNTER — Other Ambulatory Visit: Payer: Self-pay

## 2022-02-22 ENCOUNTER — Ambulatory Visit: Payer: No Typology Code available for payment source | Admitting: Dermatology

## 2022-03-13 ENCOUNTER — Encounter: Payer: Self-pay | Admitting: Cardiology

## 2022-03-13 ENCOUNTER — Encounter: Payer: Self-pay | Admitting: Internal Medicine

## 2022-03-14 ENCOUNTER — Other Ambulatory Visit: Payer: Self-pay

## 2022-03-15 ENCOUNTER — Other Ambulatory Visit: Payer: Self-pay

## 2022-03-15 MED ORDER — SEMAGLUTIDE-WEIGHT MANAGEMENT 1.7 MG/0.75ML ~~LOC~~ SOAJ
1.7000 mg | SUBCUTANEOUS | 2 refills | Status: AC
  Filled 2022-03-15: qty 3, 28d supply, fill #0
  Filled 2022-05-08: qty 3, 28d supply, fill #1
  Filled 2022-06-05: qty 3, 28d supply, fill #2

## 2022-03-16 ENCOUNTER — Other Ambulatory Visit: Payer: Self-pay

## 2022-03-28 ENCOUNTER — Other Ambulatory Visit: Payer: Self-pay

## 2022-03-28 MED FILL — Hydrochlorothiazide Tab 25 MG: ORAL | 90 days supply | Qty: 90 | Fill #1 | Status: AC

## 2022-04-18 ENCOUNTER — Other Ambulatory Visit: Payer: Self-pay

## 2022-04-30 ENCOUNTER — Other Ambulatory Visit: Payer: Self-pay

## 2022-04-30 ENCOUNTER — Other Ambulatory Visit: Payer: Self-pay | Admitting: Internal Medicine

## 2022-05-01 ENCOUNTER — Other Ambulatory Visit: Payer: Self-pay

## 2022-05-01 MED FILL — Telmisartan Tab 80 MG: ORAL | 90 days supply | Qty: 90 | Fill #0 | Status: AC

## 2022-05-09 ENCOUNTER — Other Ambulatory Visit: Payer: Self-pay

## 2022-05-24 ENCOUNTER — Other Ambulatory Visit: Payer: Self-pay

## 2022-05-24 MED FILL — Hydrochlorothiazide Tab 25 MG: ORAL | 30 days supply | Qty: 30 | Fill #2 | Status: AC

## 2022-06-05 ENCOUNTER — Other Ambulatory Visit: Payer: Self-pay

## 2022-06-11 NOTE — Progress Notes (Unsigned)
Virtual Visit via Video Note   This visit type was conducted due to national recommendations for restrictions regarding the COVID-19 Pandemic (e.g. social distancing) in an effort to limit this patient's exposure and mitigate transmission in our community.  Due to his co-morbid illnesses, this patient is at least at moderate risk for complications without adequate follow up.  This format is felt to be most appropriate for this patient at this time.  All issues noted in this document were discussed and addressed.  No physical exam could be performed with this format.  Please refer to the patient's chart for his  consent to telehealth for [facility].   Evaluation Performed:  Follow-up visit  Date:  06/12/2022   ID:  Adam [name], DOB [birth year], MRN [id number]  Patient Location:  Home  Provider location:   [facility]  WUJ:WJXB Sleep Medicine:  Fransico Him, MD Electrophysiologist:  None   Chief Complaint:  OSA  History of Present Illness:    Adam [name] is a 46 y.o. male who presents via audio/video conferencing for a telehealth visit today.    Adam [name] is a 46 y.o. male with a hx of presyncope, OSA and labile HTN. He is doing well with his PAP device and thinks that he has gotten used to it.  He tolerates the mask and feels the pressure is adequate.  Since going on PAP he feels rested in the am and has no significant daytime sleepiness.  He denies any significant mouth or nasal dryness or nasal congestion.  He does not think that he snores.  He would like to get a new device as his is almost 46 years old.    Prior CV studies:   The following studies were reviewed today:  PAP compliance download  Past Medical History:  Diagnosis Date   Asthma    Childhood   Chicken pox    Chronic headaches    Hypertension 2013   OSA (obstructive sleep apnea)    moderate with AHI 15.5/hr 2014 home sleep study   Past Surgical History:  Procedure Laterality Date   NO PAST  SURGERIES       Current Meds  Medication Sig   albuterol (PROVENTIL HFA;VENTOLIN HFA) 108 (90 Base) MCG/ACT inhaler Inhale 2 puffs into the lungs every 4 (four) hours as needed for wheezing.   aspirin 81 MG tablet Take 81 mg by mouth daily.   carvedilol (COREG) 6.25 MG tablet TAKE 1 TABLET BY MOUTH 2 TIMES DAILY WITH A MEAL.   hydrochlorothiazide (HYDRODIURIL) 25 MG tablet TAKE 1 TABLET BY MOUTH DAILY.   Multiple Vitamins-Minerals (MULTIVITAMIN WITH MINERALS) tablet Take 1 tablet by mouth daily.   Semaglutide-Weight Management 1.7 MG/0.75ML SOAJ Inject 1.7 mg into the skin once a week for 28 days.   telmisartan (MICARDIS) 80 MG tablet TAKE 1 TABLET BY MOUTH DAILY     Allergies:   Patient has no known allergies.   Social History   Tobacco Use   Smoking status: Never   Smokeless tobacco: Never  Vaping Use   Vaping Use: Never used  Substance Use Topics   Alcohol use: No   Drug use: No     Family Hx: The patient's family history includes Arthritis in his mother; Asthma in his sister; Diabetes in his brother, father, maternal grandfather, maternal grandmother, and sister; Heart disease in his sister; Heart disease (age of onset: 31) in his maternal grandmother; Hypertension in his brother, father, and mother; Kidney disease in  his maternal grandfather; Lupus in his sister.  ROS:   Please see the history of present illness.     All other systems reviewed and are negative.   Labs/Other Tests and Data Reviewed:    Recent Labs: 10/10/2021: ALT 83; BUN 17; Creatinine, Ser 1.22; Potassium 3.7; Sodium 138   Recent Lipid Panel Lab Results  Component Value Date/Time   CHOL 142 10/10/2021 10:19 AM   TRIG 193.0 (H) 10/10/2021 10:19 AM   HDL 41.30 10/10/2021 10:19 AM   CHOLHDL 3 10/10/2021 10:19 AM   LDLCALC 62 10/10/2021 10:19 AM    Wt Readings from Last 3 Encounters:  06/12/22 285 lb (129.3 kg)  09/26/21 283 lb (128.4 kg)  06/22/21 290 lb (131.5 kg)     Objective:    Vital  Signs:  Ht 6\' 3"  (1.905 m)   Wt 285 lb (129.3 kg)   BMI 35.62 kg/m    Well nourished, well developed male in no acute distress. Well appearing, alert and conversant, regular work of breathing,  good skin color  Eyes- anicteric mouth- oral mucosa is pink  neuro- grossly intact skin- no apparent rash or lesions or cyanosis  ASSESSMENT & PLAN:    1.  OSA - The patient is tolerating PAP therapy well without any problems. The PAP download performed by his DME was personally reviewed and interpreted by me today and showed an AHI of 0.5/hr on 10 cm H2O with 93% compliance in using more than 4 hours nightly.  The patient has been using and benefiting from PAP use and will continue to benefit from therapy.  -I will order him a new ResMed CPAP at 10cm H2O with heated humidity and mask of choice   2.  HTN -BP controlled at home -continue prescription ddrug management with Carvedilol 6.25mg  BID, HCTZ 25mg  daily and Telmisartan 80mg  daily with PRN refills   Time:   Today, I have spent 10 minutes on telemedicine discussing medical problems including OSA, HTN, obesity and reviewing patient's chart including PAP compliance download.  Medication Adjustments/Labs and Tests Ordered: Current medicines are reviewed at length with the patient today.  Concerns regarding medicines are outlined above.  Tests Ordered: No orders of the defined types were placed in this encounter.   Medication Changes: No orders of the defined types were placed in this encounter.    Disposition:  Follow up in 1 year(s)  Signed, Fransico Him, MD  06/12/2022 8:57 AM    [facility]

## 2022-06-12 ENCOUNTER — Encounter: Payer: Self-pay | Admitting: Cardiology

## 2022-06-12 ENCOUNTER — Ambulatory Visit: Payer: No Typology Code available for payment source | Attending: Cardiology | Admitting: Cardiology

## 2022-06-12 VITALS — Ht 75.0 in | Wt 285.0 lb

## 2022-06-12 DIAGNOSIS — Z9989 Dependence on other enabling machines and devices: Secondary | ICD-10-CM | POA: Diagnosis not present

## 2022-06-12 DIAGNOSIS — I10 Essential (primary) hypertension: Secondary | ICD-10-CM | POA: Diagnosis not present

## 2022-06-12 DIAGNOSIS — G4733 Obstructive sleep apnea (adult) (pediatric): Secondary | ICD-10-CM

## 2022-06-12 NOTE — Patient Instructions (Signed)
Medication Instructions:  Your physician recommends that you continue on your current medications as directed. Please refer to the Current Medication list given to you today.  *If you need a refill on your cardiac medications before your next appointment, please call your pharmacy*   Follow-Up: At [facility], you and your health needs are our priority.  As part of our continuing mission to provide you with exceptional heart care, we have created designated Provider Care Teams.  These Care Teams include your primary Cardiologist (physician) and Advanced Practice Providers (APPs -  Physician Assistants and Nurse Practitioners) who all work together to provide you with the care you need, when you need it.  Your next appointment:   6 weeks after receiving your new device  Important Information About Sugar

## 2022-06-13 ENCOUNTER — Telehealth: Payer: Self-pay | Admitting: *Deleted

## 2022-06-13 DIAGNOSIS — G4733 Obstructive sleep apnea (adult) (pediatric): Secondary | ICD-10-CM

## 2022-06-13 NOTE — Telephone Encounter (Signed)
Order placed to [facility]  

## 2022-06-13 NOTE — Telephone Encounter (Signed)
-----   Message from Antonieta Iba, RN sent at 06/12/2022  9:28 AM EDT ----- Per Dr. Radford Pax: order him a new ResMed CPAP at 10cm H2O with heated humidity and mask of choice Thanks!

## 2022-06-22 ENCOUNTER — Other Ambulatory Visit: Payer: Self-pay

## 2022-06-22 ENCOUNTER — Other Ambulatory Visit: Payer: Self-pay | Admitting: Internal Medicine

## 2022-06-22 ENCOUNTER — Encounter: Payer: Self-pay | Admitting: Internal Medicine

## 2022-06-22 MED ORDER — CARVEDILOL 6.25 MG PO TABS
ORAL_TABLET | Freq: Two times a day (BID) | ORAL | 2 refills | Status: DC
  Filled 2022-06-22: qty 180, 90d supply, fill #0

## 2022-06-22 MED FILL — Hydrochlorothiazide Tab 25 MG: ORAL | 30 days supply | Qty: 30 | Fill #3 | Status: AC

## 2022-06-26 ENCOUNTER — Other Ambulatory Visit: Payer: Self-pay

## 2022-06-26 MED FILL — Telmisartan Tab 80 MG: ORAL | 90 days supply | Qty: 90 | Fill #1 | Status: CN

## 2022-06-27 NOTE — Telephone Encounter (Signed)
Is it okay to send in the next dose of wegovy. Pt has been on 1.7 mg for over 2 months and is not losing weight any longer.

## 2022-06-28 ENCOUNTER — Other Ambulatory Visit: Payer: Self-pay

## 2022-06-28 ENCOUNTER — Other Ambulatory Visit: Payer: Self-pay | Admitting: Internal Medicine

## 2022-06-28 MED ORDER — WEGOVY 2.4 MG/0.75ML ~~LOC~~ SOAJ
2.4000 mg | SUBCUTANEOUS | 1 refills | Status: DC
Start: 2022-06-28 — End: 2022-10-01
  Filled 2022-06-28: qty 3, 28d supply, fill #0
  Filled 2022-07-31: qty 3, 28d supply, fill #1

## 2022-06-29 ENCOUNTER — Other Ambulatory Visit: Payer: Self-pay

## 2022-07-02 ENCOUNTER — Other Ambulatory Visit: Payer: Self-pay

## 2022-07-02 ENCOUNTER — Ambulatory Visit: Payer: No Typology Code available for payment source | Admitting: Dermatology

## 2022-07-31 MED FILL — Telmisartan Tab 80 MG: ORAL | 30 days supply | Qty: 30 | Fill #1 | Status: AC

## 2022-08-01 ENCOUNTER — Other Ambulatory Visit: Payer: Self-pay

## 2022-08-27 MED FILL — Hydrochlorothiazide Tab 25 MG: ORAL | 30 days supply | Qty: 30 | Fill #4 | Status: CN

## 2022-08-27 MED FILL — Telmisartan Tab 80 MG: ORAL | 30 days supply | Qty: 30 | Fill #2 | Status: CN

## 2022-08-28 ENCOUNTER — Other Ambulatory Visit: Payer: Self-pay

## 2022-09-04 ENCOUNTER — Other Ambulatory Visit: Payer: Self-pay

## 2022-09-04 MED FILL — Hydrochlorothiazide Tab 25 MG: ORAL | 30 days supply | Qty: 30 | Fill #4 | Status: AC

## 2022-09-04 MED FILL — Telmisartan Tab 80 MG: ORAL | 30 days supply | Qty: 30 | Fill #2 | Status: AC

## 2022-09-12 ENCOUNTER — Ambulatory Visit (INDEPENDENT_AMBULATORY_CARE_PROVIDER_SITE_OTHER): Payer: No Typology Code available for payment source | Admitting: Dermatology

## 2022-09-12 ENCOUNTER — Other Ambulatory Visit: Payer: Self-pay

## 2022-09-12 DIAGNOSIS — Z79899 Other long term (current) drug therapy: Secondary | ICD-10-CM

## 2022-09-12 DIAGNOSIS — L918 Other hypertrophic disorders of the skin: Secondary | ICD-10-CM | POA: Diagnosis not present

## 2022-09-12 DIAGNOSIS — L219 Seborrheic dermatitis, unspecified: Secondary | ICD-10-CM

## 2022-09-12 MED ORDER — KETOCONAZOLE 2 % EX SHAM
1.0000 | MEDICATED_SHAMPOO | CUTANEOUS | 3 refills | Status: DC
  Filled 2022-09-12: qty 120, 30d supply, fill #0
  Filled 2022-11-29: qty 120, 30d supply, fill #1
  Filled 2023-07-18: qty 120, 30d supply, fill #2
  Filled 2023-08-28: qty 120, 30d supply, fill #3

## 2022-09-12 MED ORDER — HYDROCORTISONE 2.5 % EX CREA
TOPICAL_CREAM | CUTANEOUS | 3 refills | Status: DC
  Filled 2022-09-12: qty 60, 30d supply, fill #0
  Filled 2022-11-29: qty 60, 30d supply, fill #1
  Filled 2023-07-18: qty 60, 30d supply, fill #2

## 2022-09-12 NOTE — Patient Instructions (Signed)
Wound Care Instructions  Cleanse wound gently with soap and water once a day then pat dry with clean gauze. Apply a thin coat of Petrolatum (petroleum jelly, "Vaseline") over the wound (unless you have an allergy to this). We recommend that you use a new, sterile tube of Vaseline. Do not pick or remove scabs. Do not remove the yellow or white "healing tissue" from the base of the wound.  Cover the wound with fresh, clean, nonstick gauze and secure with paper tape. You may use Band-Aids in place of gauze and tape if the wound is small enough, but would recommend trimming much of the tape off as there is often too much. Sometimes Band-Aids can irritate the skin.  You should call the office for your biopsy report after 1 week if you have not already been contacted.  If you experience any problems, such as abnormal amounts of bleeding, swelling, significant bruising, significant pain, or evidence of infection, please call the office immediately.  FOR ADULT SURGERY PATIENTS: If you need something for pain relief you may take 1 extra strength Tylenol (acetaminophen) AND 2 Ibuprofen (200mg each) together every 4 hours as needed for pain. (do not take these if you are allergic to them or if you have a reason you should not take them.) Typically, you may only need pain medication for 1 to 3 days.     Due to recent changes in healthcare laws, you may see results of your pathology and/or laboratory studies on MyChart before the doctors have had a chance to review them. We understand that in some cases there may be results that are confusing or concerning to you. Please understand that not all results are received at the same time and often the doctors may need to interpret multiple results in order to provide you with the best plan of care or course of treatment. Therefore, we ask that you please give us 2 business days to thoroughly review all your results before contacting the office for clarification. Should  we see a critical lab result, you will be contacted sooner.   If You Need Anything After Your Visit  If you have any questions or concerns for your doctor, please call our main line at [phone] and press option 4 to reach your doctor's medical assistant. If no one answers, please leave a voicemail as directed and we will return your call as soon as possible. Messages left after 4 pm will be answered the following business day.   You may also send us a message via MyChart. We typically respond to MyChart messages within 1-2 business days.  For prescription refills, please ask your pharmacy to contact our office. Our fax number is [phone].  If you have an urgent issue when the clinic is closed that cannot wait until the next business day, you can page your doctor at the number below.    Please note that while we do our best to be available for urgent issues outside of office hours, we are not available 24/7.   If you have an urgent issue and are unable to reach us, you may choose to seek medical care at your doctor's office, retail clinic, urgent care center, or emergency room.  If you have a medical emergency, please immediately call 911 or go to the emergency department.  Pager Numbers  - Dr. Kowalski: [phone]  - Dr. Moye: [phone]  - Dr. Stewart: [phone]  In the event of inclement weather, please call our main line at   [phone] for an update on the status of any delays or closures.  Dermatology Medication Tips: Please keep the boxes that topical medications come in in order to help keep track of the instructions about where and how to use these. Pharmacies typically print the medication instructions only on the boxes and not directly on the medication tubes.   If your medication is too expensive, please contact our office at [phone] option 4 or send us a message through MyChart.   We are unable to tell what your co-pay for medications will be in  advance as this is different depending on your insurance coverage. However, we may be able to find a substitute medication at lower cost or fill out paperwork to get insurance to cover a needed medication.   If a prior authorization is required to get your medication covered by your insurance company, please allow us 1-2 business days to complete this process.  Drug prices often vary depending on where the prescription is filled and some pharmacies may offer cheaper prices.  The website www.goodrx.com contains coupons for medications through different pharmacies. The prices here do not account for what the cost may be with help from insurance (it may be cheaper with your insurance), but the website can give you the price if you did not use any insurance.  - You can print the associated coupon and take it with your prescription to the pharmacy.  - You may also stop by our office during regular business hours and pick up a GoodRx coupon card.  - If you need your prescription sent electronically to a different pharmacy, notify our office through [place] MyChart or by phone at [phone] option 4.     Si Usted Necesita Algo Despus de Su Visita  Tambin puede enviarnos un mensaje a travs de MyChart. Por lo general respondemos a los mensajes de MyChart en el transcurso de 1 a 2 das hbiles.  Para renovar recetas, por favor pida a su farmacia que se ponga en contacto con nuestra oficina. Nuestro nmero de fax es el [phone].  Si tiene un asunto urgente cuando la clnica est cerrada y que no puede esperar hasta el siguiente da hbil, puede llamar/localizar a su doctor(a) al nmero que aparece a continuacin.   Por favor, tenga en cuenta que aunque hacemos todo lo posible para estar disponibles para asuntos urgentes fuera del horario de oficina, no estamos disponibles las 24 horas del da, los 7 das de la semana.   Si tiene un problema urgente y no puede comunicarse con nosotros, puede  optar por buscar atencin mdica  en el consultorio de su doctor(a), en una clnica privada, en un centro de atencin urgente o en una sala de emergencias.  Si tiene una emergencia mdica, por favor llame inmediatamente al 911 o vaya a la sala de emergencias.  Nmeros de bper  - Dr. Kowalski: [phone]  - Dra. Moye: [phone]  - Dra. Stewart: [phone]  En caso de inclemencias del tiempo, por favor llame a nuestra lnea principal al [phone] para una actualizacin sobre el estado de cualquier retraso o cierre.  Consejos para la medicacin en dermatologa: Por favor, guarde las cajas en las que vienen los medicamentos de uso tpico para ayudarle a seguir las instrucciones sobre dnde y cmo usarlos. Las farmacias generalmente imprimen las instrucciones del medicamento slo en las cajas y no directamente en los tubos del medicamento.   Si su medicamento es muy caro, por favor, pngase en contacto con   nuestra oficina llamando al [phone] y presione la opcin 4 o envenos un mensaje a travs de MyChart.   No podemos decirle cul ser su copago por los medicamentos por adelantado ya que esto es diferente dependiendo de la cobertura de su seguro. Sin embargo, es posible que podamos encontrar un medicamento sustituto a menor costo o llenar un formulario para que el seguro cubra el medicamento que se considera necesario.   Si se requiere una autorizacin previa para que su compaa de seguros cubra su medicamento, por favor permtanos de 1 a 2 das hbiles para completar este proceso.  Los precios de los medicamentos varan con frecuencia dependiendo del lugar de dnde se surte la receta y alguna farmacias pueden ofrecer precios ms baratos.  El sitio web www.goodrx.com tiene cupones para medicamentos de diferentes farmacias. Los precios aqu no tienen en cuenta lo que podra costar con la ayuda del seguro (puede ser ms barato con su seguro), pero el sitio web puede darle el  precio si no utiliz ningn seguro.  - Puede imprimir el cupn correspondiente y llevarlo con su receta a la farmacia.  - Tambin puede pasar por nuestra oficina durante el horario de atencin regular y recoger una tarjeta de cupones de GoodRx.  - Si necesita que su receta se enve electrnicamente a una farmacia diferente, informe a nuestra oficina a travs de MyChart de [place] o por telfono llamando al [phone] y presione la opcin 4.  

## 2022-09-12 NOTE — Progress Notes (Signed)
   New Patient Visit  Subjective  Adam [name] is a 46 y.o. male who presents for the following: Total body skin exam and dry skin on face (Glabella, chin, years). The patient presents for Total-Body Skin Exam (TBSE) for skin cancer screening and mole check.  The patient has spots, moles and lesions to be evaluated, some may be new or changing and the patient has concerns that these could be cancer.   The following portions of the chart were reviewed this encounter and updated as appropriate:   Tobacco  Allergies  Meds  Problems  Med Hx  Surg Hx  Fam Hx     Review of Systems:  No other skin or systemic complaints except as noted in HPI or Assessment and Plan.  Objective  Well appearing patient in no apparent distress; mood and affect are within normal limits.  All skin waist up examined.  R neck x 2, L neck x 3, L axilla x 5, R infrapectoral x 1, R axilla x 4 (15) Fleshy, skin-colored pedunculated papules.    Head - Anterior (Face) Pink patches with greasy scale.    Assessment & Plan  Skin tag (15) R neck x 2, L neck x 3, L axilla x 5, R infrapectoral x 1, R axilla x 4 Discussed cosmetic fee of $115 for up to 15  Discussed risk of keloiding  Epidermal / dermal shaving - R neck x 2, L neck x 3, L axilla x 5, R infrapectoral x 1, R axilla x 4  Informed consent: discussed and consent obtained   Anesthesia: the lesion was anesthetized in a standard fashion   Anesthetic:  1% lidocaine w/ epinephrine 1-100,000 buffered w/ 8.4% NaHCO3 Instrument used: scissors   Hemostasis achieved with: pressure, aluminum chloride and electrodesiccation   Outcome: patient tolerated procedure well   Post-procedure details: wound care instructions given   Additional details:  X 15  Seborrheic dermatitis Head - Anterior (Face) Seborrheic Dermatitis  -  is a chronic persistent rash characterized by pinkness and scaling most commonly of the mid face but also can occur on the scalp (dandruff),  ears; mid chest, mid back and groin.  It tends to be exacerbated by stress and cooler weather.  People who have neurologic disease may experience new onset or exacerbation of existing seborrheic dermatitis.  The condition is not curable but treatable and can be controlled.  Start Ketoconazole 2% shampoo washing face/beard 3d/wk, let sit 5 minutes and rinse out Start HC 2.5% cr 3d/wk to glabella, chin prn flares  ketoconazole (NIZORAL) 2 % shampoo - Head - Anterior (Face) Apply 1 Application topically 3 (three) times a week. Wash face, scalp 3 days a week hydrocortisone 2.5 % cream - Head - Anterior (Face) Apply topically 3 (three) times a week. Apply to scaly patches on face 3 nights a week as needed for flares  Return in about 3 months (around 12/12/2022) for skin tag removal, seb derm f/u.  I, Ardis Rowan, RMA, am acting as scribe for Armida Sans, MD . Documentation: I have reviewed the above documentation for accuracy and completeness, and I agree with the above.  Armida Sans, MD

## 2022-09-21 ENCOUNTER — Encounter: Payer: Self-pay | Admitting: Dermatology

## 2022-10-01 ENCOUNTER — Ambulatory Visit (INDEPENDENT_AMBULATORY_CARE_PROVIDER_SITE_OTHER): Payer: No Typology Code available for payment source | Admitting: Internal Medicine

## 2022-10-01 ENCOUNTER — Encounter: Payer: Self-pay | Admitting: Internal Medicine

## 2022-10-01 ENCOUNTER — Other Ambulatory Visit: Payer: Self-pay

## 2022-10-01 ENCOUNTER — Telehealth: Payer: Self-pay

## 2022-10-01 VITALS — BP 116/80 | HR 62 | Temp 98.7°F | Ht 75.0 in | Wt 293.4 lb

## 2022-10-01 DIAGNOSIS — R5383 Other fatigue: Secondary | ICD-10-CM

## 2022-10-01 DIAGNOSIS — E785 Hyperlipidemia, unspecified: Secondary | ICD-10-CM | POA: Diagnosis not present

## 2022-10-01 DIAGNOSIS — K760 Fatty (change of) liver, not elsewhere classified: Secondary | ICD-10-CM

## 2022-10-01 DIAGNOSIS — R200 Anesthesia of skin: Secondary | ICD-10-CM

## 2022-10-01 DIAGNOSIS — I10 Essential (primary) hypertension: Secondary | ICD-10-CM | POA: Diagnosis not present

## 2022-10-01 DIAGNOSIS — E118 Type 2 diabetes mellitus with unspecified complications: Secondary | ICD-10-CM | POA: Diagnosis not present

## 2022-10-01 DIAGNOSIS — B351 Tinea unguium: Secondary | ICD-10-CM

## 2022-10-01 DIAGNOSIS — G4733 Obstructive sleep apnea (adult) (pediatric): Secondary | ICD-10-CM

## 2022-10-01 DIAGNOSIS — E1169 Type 2 diabetes mellitus with other specified complication: Secondary | ICD-10-CM

## 2022-10-01 DIAGNOSIS — Z125 Encounter for screening for malignant neoplasm of prostate: Secondary | ICD-10-CM

## 2022-10-01 DIAGNOSIS — R202 Paresthesia of skin: Secondary | ICD-10-CM

## 2022-10-01 LAB — POCT GLYCOSYLATED HEMOGLOBIN (HGB A1C): Hemoglobin A1C: 5.6 % (ref 4.0–5.6)

## 2022-10-01 MED ORDER — METFORMIN HCL ER 500 MG PO TB24
500.0000 mg | ORAL_TABLET | Freq: Every day | ORAL | 1 refills | Status: DC
  Filled 2022-10-01: qty 30, 30d supply, fill #0

## 2022-10-01 MED ORDER — TIRZEPATIDE 2.5 MG/0.5ML ~~LOC~~ SOAJ
2.5000 mg | SUBCUTANEOUS | 2 refills | Status: DC
  Filled 2022-10-01 – 2022-10-31 (×3): qty 2, 28d supply, fill #0
  Filled 2022-11-23: qty 2, 28d supply, fill #1

## 2022-10-01 NOTE — Telephone Encounter (Signed)
Pharmacy stated that [facility] requires pt to try Metformin first.

## 2022-10-01 NOTE — Progress Notes (Signed)
Subjective:  Patient ID: Adam [name], male    DOB: 01/11/1976  Age: 47 y.o. MRN: [id number]  CC: The primary encounter diagnosis was Primary hypertension. Diagnoses of Controlled type 2 diabetes mellitus with complication, without long-term current use of insulin ([facility]), Hyperlipidemia, unspecified hyperlipidemia type, and Other fatigue were also pertinent to this visit.   HPI [name] presents for  Chief Complaint  Patient presents with   Medical Management of Chronic Issues    diabetes   TYPE 2 dm:  LAST SEEN ONE YEAR AGO . Lost 30 lbs on his own,  then started semaglutide , and  reached a nadir of 268 lbs.  Then  his insurance change resulting  in loss of coverage. last refill October 2023 . Has had 8 lb weight gain since then.  Frustrated   at increased appetite and weight gain.  diet reviewed:  lots of fruit,  green vegetables,   wants to eat everything in sight since stopping medication   Hands going numb :  when sleeping  right hand goes  to sleep if sleeping on left side.  Left hand goes numb when driving . [address]. .   Right hand started first , index and thumb .  Denies neck pain   Bilateral onychomycosis    Outpatient Medications Prior to Visit  Medication Sig Dispense Refill   albuterol (PROVENTIL HFA;VENTOLIN HFA) 108 (90 Base) MCG/ACT inhaler Inhale 2 puffs into the lungs every 4 (four) hours as needed for wheezing. 1 Inhaler 2   aspirin 81 MG tablet Take 81 mg by mouth daily.     carvedilol (COREG) 6.25 MG tablet TAKE 1 TABLET BY MOUTH 2 TIMES DAILY WITH A MEAL. 180 tablet 2   hydrochlorothiazide (HYDRODIURIL) 25 MG tablet TAKE 1 TABLET BY MOUTH DAILY. 90 tablet 3   hydrocortisone 2.5 % cream Apply topically 3 (three) times a week. Apply to scaly patches on face 3 nights a week as needed for flares 60 g 3   ketoconazole (NIZORAL) 2 % shampoo Apply 1 Application topically 3 (three) times a week. Wash face, scalp 3 days a week 120 mL 3   Multiple  Vitamins-Minerals (MULTIVITAMIN WITH MINERALS) tablet Take 1 tablet by mouth daily.     telmisartan (MICARDIS) 80 MG tablet TAKE 1 TABLET BY MOUTH DAILY 90 tablet 1   Semaglutide-Weight Management (WEGOVY) 2.4 MG/0.75ML SOAJ Inject 2.4 mg into the skin once a week. (Patient not taking: Reported on 10/01/2022) 3 mL 1   No facility-administered medications prior to visit.    Review of Systems;  Patient denies headache, fevers, malaise, unintentional weight loss, skin rash, eye pain, sinus congestion and sinus pain, sore throat, dysphagia,  hemoptysis , cough, dyspnea, wheezing, chest pain, palpitations, orthopnea, edema, abdominal pain, nausea, melena, diarrhea, constipation, flank pain, dysuria, hematuria, urinary  Frequency, nocturia, numbness, tingling, seizures,  Focal weakness, Loss of consciousness,  Tremor, insomnia, depression, anxiety, and suicidal ideation.      Objective:  BP 116/80   Pulse 62   Temp 98.7 F (37.1 C) (Oral)   Ht 6\' 3"  (1.905 m)   Wt 293 lb 6.4 oz (133.1 kg)   SpO2 97%   BMI 36.67 kg/m   BP Readings from Last 3 Encounters:  10/01/22 116/80  03/21/21 (!) 161/81  12/13/20 (!) 149/88    Wt Readings from Last 3 Encounters:  10/01/22 293 lb 6.4 oz (133.1 kg)  06/12/22 285 lb (129.3 kg)  09/26/21 283 lb (128.4 kg)  Physical Exam  Lab Results  Component Value Date   HGBA1C 6.3 10/10/2021   HGBA1C 6.4 10/18/2020   HGBA1C 6.0 01/13/2019    Lab Results  Component Value Date   CREATININE 1.22 10/10/2021   CREATININE 1.18 12/13/2020   CREATININE 1.31 10/18/2020    Lab Results  Component Value Date   WBC 7.6 01/14/2015   HGB 17.0 01/14/2015   HCT 50.9 01/14/2015   PLT 240.0 01/14/2015   GLUCOSE 149 (H) 10/10/2021   CHOL 142 10/10/2021   TRIG 193.0 (H) 10/10/2021   HDL 41.30 10/10/2021   LDLCALC 62 10/10/2021   ALT 83 (H) 10/10/2021   AST 34 10/10/2021   NA 138 10/10/2021   K 3.7 10/10/2021   CL 99 10/10/2021   CREATININE 1.22 10/10/2021    BUN 17 10/10/2021   CO2 34 (H) 10/10/2021   TSH 0.67 01/14/2015   PSA 0.51 10/10/2021   HGBA1C 6.3 10/10/2021   MICROALBUR 2.6 (H) 10/10/2021    US ABDOMEN LIMITED RUQ  Result Date: 08/26/2018 CLINICAL DATA:  Nonalcoholic fatty liver disease. Elevated liver enzymes. EXAM: ULTRASOUND ABDOMEN LIMITED RIGHT UPPER QUADRANT COMPARISON:  01/03/2012 FINDINGS: Gallbladder: Gallbladder has a normal appearance. Gallbladder wall is 1.8 millimeters, within normal limits. No stones or pericholecystic fluid. No sonographic Murphy's sign. Common bile duct: Diameter: 4.0 millimeters Liver: The liver is echogenic. There is attenuation of the ultrasound wave, poor visualization of the internal hepatic architecture, and loss of definition of the diaphragm. There is question mild nodularity along the surface of the liver which can be seen in cirrhosis. There is a focal area of relatively hypoechoic liver parenchyma adjacent to the gallbladder fossa, likely representing focal fatty sparing. No discrete focal lesion identified. Portal vein is patent on color Doppler imaging with normal direction of blood flow towards the liver. IMPRESSION: 1. No evidence for acute cholecystitis. 2. Findings consistent with hepatic steatosis. 3. Question early cirrhotic changes. Electronically Signed   By: Norva Pavlov M.D.   On: 08/26/2018 09:21    Assessment & Plan:  .Primary hypertension -     Comprehensive metabolic panel -     Microalbumin / creatinine urine ratio  Controlled type 2 diabetes mellitus with complication, without long-term current use of insulin (HCC) -     Hemoglobin A1c -     Comprehensive metabolic panel -     Microalbumin / creatinine urine ratio  Hyperlipidemia, unspecified hyperlipidemia type -     Lipid panel -     LDL cholesterol, direct  Other fatigue -     TSH -     CBC with Differential/Platelet     I provided 30 minutes of face-to-face time during this encounter reviewing patient's  last visit with me, patient's  most recent visit with cardiology,  nephrology,  and neurology,  recent surgical and non surgical procedures, previous  labs and imaging studies, counseling on currently addressed issues,  and post visit ordering to diagnostics and therapeutics .   Follow-up: No follow-ups on file.   Sherlene Shams, MD

## 2022-10-01 NOTE — Telephone Encounter (Signed)
My Chart message sent

## 2022-10-01 NOTE — Patient Instructions (Signed)
Referral to podiatry to manage the thickened toenails   Referral to neurology to investigate the cause of bilateral hand numbness (Carpal tunnel suspected)   Adam [name] is covered,  but requires a PA which we will do  Adam [name] works just like Adam [name]

## 2022-10-01 NOTE — Addendum Note (Signed)
Addended by: [name] on: 10/01/2022 05:20 PM   Modules accepted: Orders

## 2022-10-02 ENCOUNTER — Other Ambulatory Visit (INDEPENDENT_AMBULATORY_CARE_PROVIDER_SITE_OTHER): Payer: No Typology Code available for payment source

## 2022-10-02 DIAGNOSIS — Z125 Encounter for screening for malignant neoplasm of prostate: Secondary | ICD-10-CM | POA: Diagnosis not present

## 2022-10-02 LAB — CBC WITH DIFFERENTIAL/PLATELET
Basophils Absolute: 0.1 10*3/uL (ref 0.0–0.1)
Basophils Relative: 0.7 % (ref 0.0–3.0)
Eosinophils Absolute: 0.2 10*3/uL (ref 0.0–0.7)
Eosinophils Relative: 2.5 % (ref 0.0–5.0)
HCT: 50 % (ref 39.0–52.0)
Hemoglobin: 16.7 g/dL (ref 13.0–17.0)
Lymphocytes Relative: 45.8 % (ref 12.0–46.0)
Lymphs Abs: 3.5 10*3/uL (ref 0.7–4.0)
MCHC: 33.3 g/dL (ref 30.0–36.0)
MCV: 92.3 fl (ref 78.0–100.0)
Monocytes Absolute: 0.6 10*3/uL (ref 0.1–1.0)
Monocytes Relative: 7.8 % (ref 3.0–12.0)
Neutro Abs: 3.3 10*3/uL (ref 1.4–7.7)
Neutrophils Relative %: 43.2 % (ref 43.0–77.0)
Platelets: 255 10*3/uL (ref 150.0–400.0)
RBC: 5.42 Mil/uL (ref 4.22–5.81)
RDW: 13.2 % (ref 11.5–15.5)
WBC: 7.7 10*3/uL (ref 4.0–10.5)

## 2022-10-02 LAB — PSA: PSA: 0.77 ng/mL (ref 0.10–4.00)

## 2022-10-02 LAB — COMPREHENSIVE METABOLIC PANEL
ALT: 58 U/L — ABNORMAL HIGH (ref 0–53)
AST: 29 U/L (ref 0–37)
Albumin: 4.2 g/dL (ref 3.5–5.2)
Alkaline Phosphatase: 58 U/L (ref 39–117)
BUN: 16 mg/dL (ref 6–23)
CO2: 32 mEq/L (ref 19–32)
Calcium: 9.8 mg/dL (ref 8.4–10.5)
Chloride: 100 mEq/L (ref 96–112)
Creatinine, Ser: 1.22 mg/dL (ref 0.40–1.50)
GFR: 70.94 mL/min (ref >60.00)
Glucose, Bld: 81 mg/dL (ref 70–99)
Potassium: 4.1 mEq/L (ref 3.5–5.1)
Sodium: 140 mEq/L (ref 135–145)
Total Bilirubin: 1.3 mg/dL — ABNORMAL HIGH (ref 0.2–1.2)
Total Protein: 7.2 g/dL (ref 6.0–8.3)

## 2022-10-02 LAB — TSH: TSH: 0.95 u[IU]/mL (ref 0.35–5.50)

## 2022-10-02 LAB — LIPID PANEL
Cholesterol: 142 mg/dL (ref 0–200)
HDL: 46.5 mg/dL (ref >39.00)
LDL Cholesterol: 76 mg/dL (ref 0–99)
NonHDL: 95.33
Total CHOL/HDL Ratio: 3
Triglycerides: 97 mg/dL (ref 0.0–149.0)
VLDL: 19.4 mg/dL (ref 0.0–40.0)

## 2022-10-02 LAB — MICROALBUMIN / CREATININE URINE RATIO
Creatinine,U: 152.5 mg/dL
Microalb Creat Ratio: 0.8 mg/g (ref 0.0–30.0)
Microalb, Ur: 1.2 mg/dL (ref 0.0–1.9)

## 2022-10-02 LAB — LDL CHOLESTEROL, DIRECT: Direct LDL: 83 mg/dL

## 2022-10-02 MED FILL — Telmisartan Tab 80 MG: ORAL | 30 days supply | Qty: 30 | Fill #3 | Status: AC

## 2022-10-02 MED FILL — Hydrochlorothiazide Tab 25 MG: ORAL | 30 days supply | Qty: 30 | Fill #5 | Status: AC

## 2022-10-02 NOTE — Assessment & Plan Note (Signed)
Diagnosed by sleep study. he is wearing his CPAP every night a minimum of 6 hours per night and notes improved daytime wakefulness and decreased fatigue  

## 2022-10-02 NOTE — Assessment & Plan Note (Signed)
Diagnosed with A1c of 6.4, fasting glucose of 124.  Complicated by hypertension fatty liver, obesity and OSA.    Recommend resuming  but insurance requires trial of metformin fist.  Metformin XR prescribed  Lab Results  Component Value Date   HGBA1C 5.6 10/01/2022

## 2022-10-02 NOTE — Assessment & Plan Note (Addendum)
He is an alpha 1 antitrypsin mutation carrier,  based on previously reported labs  and GI evaluation by Dr Allen Norris.  No biopys was done. He was referred to a nutritionist,  but thus far only GLP 1 agonists have been successful in achieving and maintaining weight loss.

## 2022-10-05 ENCOUNTER — Other Ambulatory Visit: Payer: Self-pay

## 2022-10-05 ENCOUNTER — Encounter: Payer: Self-pay | Admitting: Neurology

## 2022-10-05 DIAGNOSIS — R202 Paresthesia of skin: Secondary | ICD-10-CM

## 2022-10-15 ENCOUNTER — Encounter: Payer: Self-pay | Admitting: Podiatry

## 2022-10-15 ENCOUNTER — Other Ambulatory Visit: Payer: Self-pay

## 2022-10-15 ENCOUNTER — Ambulatory Visit (INDEPENDENT_AMBULATORY_CARE_PROVIDER_SITE_OTHER): Payer: No Typology Code available for payment source | Admitting: Podiatry

## 2022-10-15 DIAGNOSIS — B351 Tinea unguium: Secondary | ICD-10-CM

## 2022-10-15 MED ORDER — TERBINAFINE HCL 250 MG PO TABS
250.0000 mg | ORAL_TABLET | Freq: Every day | ORAL | 0 refills | Status: AC
  Filled 2022-10-15: qty 30, 30d supply, fill #0
  Filled 2022-12-07: qty 30, 30d supply, fill #1
  Filled 2023-01-13: qty 30, 30d supply, fill #2

## 2022-10-15 NOTE — Progress Notes (Signed)
  Subjective:  Patient ID: Adam [name], male    DOB: 01-29-1976,  MRN: [id number]  Chief Complaint  Patient presents with   Nail Problem    np Onychomycosis of multiple toenails with type 2 diabetes mellitus (pt states borderline diabetic)    47 y.o. male presents with the above complaint. History confirmed with patient.  He said this for a long time never been treated for it  Objective:  Physical Exam: warm, good capillary refill, no trophic changes or ulcerative lesions, normal DP and PT pulses, normal sensory exam, and onychomycosis.      Assessment:   1. Onychomycosis      Plan:  Patient was evaluated and treated and all questions answered.  Onychomycosis -Educated on etiology of nail fungus. -Discussed oral topical and laser therapy and risks and benefits of each -eRx for oral terbinafine #90. Educated on risks and benefits of the medication. -Photographs taken -We will plan to check LFTs after next visit, he had some elevation about a year ago he said this was due to diet and exercise that he was not doing, it has been much better since then.  Return in about 3 months (around 01/14/2023) for follow up after nail fungus treatment.

## 2022-10-22 ENCOUNTER — Ambulatory Visit (INDEPENDENT_AMBULATORY_CARE_PROVIDER_SITE_OTHER): Payer: No Typology Code available for payment source | Admitting: Neurology

## 2022-10-22 DIAGNOSIS — R202 Paresthesia of skin: Secondary | ICD-10-CM

## 2022-10-22 DIAGNOSIS — M5412 Radiculopathy, cervical region: Secondary | ICD-10-CM

## 2022-10-22 DIAGNOSIS — G5603 Carpal tunnel syndrome, bilateral upper limbs: Secondary | ICD-10-CM

## 2022-10-22 NOTE — Procedures (Signed)
[facility]  [address], [place]  [place], [place] [address] Tel: [phone] Fax: [phone] Test Date:  10/22/2022  Patient: Adam [name] DOB: 12/12/1975 Physician: Kai Levins, MD  Sex: Male Height: 6\' 3"  Ref Phys: Satira Sark, MD  ID#: [id number]   Technician:    History: This is a 47 year old male with numbness and tingling of both hands.  NCV & EMG Findings: Extensive electrodiagnostic evaluation of the right upper limb with additional nerve conduction studies and needle examination of the left upper limb shows: Bilateral median sensory responses are absent. Bilateral ulnar and right radial sensory responses are within normal limits. Left median (APB) motor response shows prolonged distal onset latency (4.9 ms) and reduced amplitude (4.1 mV).  The right median (APB) motor response shows prolonged distal onset latency (5.6 ms). Right ulnar (ADM) motor response is within normal limits. Chronic motor axon loss changes without accompanying active denervation changes are seen in bilateral first dorsal interosseous, bilateral extensor indicis proprius, and right triceps muscles. All other tested muscles are within normal limits with normal motor unit configuration and recruitment patterns.  Impression: This is an abnormal electrodiagnostic evaluation. The findings are most consistent with the following: Evidence of bilateral median mononeuropathy at or distal to the wrist, consistent with carpal tunnel syndrome, severe in degree electrically bilaterally, left worse than right. The residuals of an old intraspinal canal lesion(s) (ie: motor radiculopathy) at bilateral C8 nerve roots, mild in degree electrically bilaterally. No definitive electrodiagnostic evidence of a right or left ulnar mononeuropathy.   ___________________________ Kai Levins, MD    Nerve Conduction Studies Motor Nerve Results    Latency Amplitude F-Lat Segment Distance CV Comment  Site (ms)  Norm (mV) Norm (ms)  (cm) (m/s) Norm   Left Median (APB) Motor  Wrist *4.9  < 3.9 *4.1  > 6.0        Right Median (APB) Motor  Wrist *5.6  < 3.9 6.5  > 6.0        Elbow 11.2 - 6.5 -  Elbow-Wrist 29.5 53  > 50   Right Ulnar (ADM) Motor  Wrist 2.3  < 3.1 8.2  > 7.0        Bel elbow 7.3 - 8.1 -  Bel elbow-Wrist 26 52  > 50   Ab elbow 9.3 - 7.8 -  Ab elbow-Bel elbow 10 50 -    Sensory Sites    Neg Peak Lat Amplitude (O-P) Segment Distance Velocity Comment  Site (ms) Norm (V) Norm  (cm) (ms)   Left Median Sensory  Wrist-Dig II *NR  < 3.4 *NR  > 20 Wrist-Dig II 13    Right Median Sensory  Wrist-Dig II *NR  < 3.4 *NR  > 20 Wrist-Dig II 13    Right Radial Sensory  Forearm-Wrist 2.3  < 2.7 20  > 18 Forearm-Wrist 10    Left Ulnar Sensory  Wrist-Dig V 2.9  < 3.1 12  > 12 Wrist-Dig V 11    Right Ulnar Sensory  Wrist-Dig V 3.1  < 3.1 12  > 12 Wrist-Dig V 11     Electromyography   Side Muscle Ins.Act Fibs Fasc Recrt Amp Dur Poly Activation Comment  Right FDI Nml Nml Nml *1- *1+ *1+ *1+ Nml N/A  Right EIP Nml Nml Nml *1- *1+ *1+ *1+ Nml N/A  Right FPL Nml Nml Nml Nml Nml Nml Nml Nml N/A  Right APB Nml Nml Nml Nml Nml Nml Nml Nml N/A  Right Pronator teres Nml Nml Nml Nml Nml Nml Nml Nml N/A  Right Biceps Nml Nml Nml Nml Nml Nml Nml Nml N/A  Right Triceps Nml Nml Nml *1- *1+ *1+ *1+ Nml N/A  Right Deltoid Nml Nml Nml Nml Nml Nml Nml Nml N/A  Right C7 PSP Nml Nml Nml Nml Nml Nml Nml Nml N/A  Left FDI Nml Nml Nml *1- *1+ *1+ *1+ Nml N/A  Left EIP Nml Nml Nml *1- *1+ *1+ *1+ Nml N/A  Left APB Nml Nml Nml Nml Nml Nml Nml Nml N/A      Waveforms:  Motor        Sensory

## 2022-10-23 NOTE — Telephone Encounter (Signed)
Pt w/ adverse effects when taking metformin. Need to re-initiate a PA for [facility]

## 2022-10-25 ENCOUNTER — Other Ambulatory Visit (HOSPITAL_COMMUNITY): Payer: Self-pay

## 2022-10-25 NOTE — Telephone Encounter (Addendum)
Patient Advocate Encounter   Received notification from [employer] that prior authorization for Darcel Bayley is required.   PA submitted on 10/25/2022 Key [id number] Status is pending   APPROVED [facility] 10-26-2023

## 2022-10-30 ENCOUNTER — Other Ambulatory Visit: Payer: Self-pay

## 2022-10-31 ENCOUNTER — Other Ambulatory Visit: Payer: Self-pay

## 2022-11-05 ENCOUNTER — Ambulatory Visit (INDEPENDENT_AMBULATORY_CARE_PROVIDER_SITE_OTHER): Payer: No Typology Code available for payment source | Admitting: Internal Medicine

## 2022-11-05 ENCOUNTER — Other Ambulatory Visit: Payer: Self-pay

## 2022-11-05 ENCOUNTER — Encounter: Payer: Self-pay | Admitting: Internal Medicine

## 2022-11-05 VITALS — BP 120/86 | HR 65 | Temp 98.2°F | Ht 75.0 in | Wt 298.8 lb

## 2022-11-05 DIAGNOSIS — G5603 Carpal tunnel syndrome, bilateral upper limbs: Secondary | ICD-10-CM | POA: Diagnosis not present

## 2022-11-05 MED ORDER — PREDNISONE 10 MG PO TABS
ORAL_TABLET | ORAL | 0 refills | Status: AC
  Filled 2022-11-05: qty 21, 6d supply, fill #0

## 2022-11-05 MED ORDER — ALBUTEROL SULFATE HFA 108 (90 BASE) MCG/ACT IN AERS
2.0000 | INHALATION_SPRAY | RESPIRATORY_TRACT | 2 refills | Status: DC | PRN
  Filled 2022-11-05: qty 6.7, 30d supply, fill #0

## 2022-11-05 MED ORDER — TELMISARTAN 80 MG PO TABS
80.0000 mg | ORAL_TABLET | Freq: Every day | ORAL | 1 refills | Status: DC
  Filled 2022-11-05: qty 30, 30d supply, fill #0
  Filled 2022-12-09: qty 30, 30d supply, fill #1
  Filled 2023-01-13: qty 30, 30d supply, fill #2
  Filled 2023-02-21: qty 30, 30d supply, fill #3
  Filled 2023-03-22: qty 30, 30d supply, fill #4
  Filled 2023-05-08: qty 30, 30d supply, fill #5

## 2022-11-05 MED ORDER — CARVEDILOL 6.25 MG PO TABS
ORAL_TABLET | Freq: Two times a day (BID) | ORAL | 2 refills | Status: DC
  Filled 2022-11-05: qty 60, 30d supply, fill #0
  Filled 2022-12-09: qty 60, 30d supply, fill #1
  Filled 2023-01-13: qty 60, 30d supply, fill #2
  Filled 2023-02-21: qty 60, 30d supply, fill #3
  Filled 2023-03-22: qty 60, 30d supply, fill #4
  Filled 2023-05-08: qty 60, 30d supply, fill #5
  Filled 2023-06-11: qty 60, 30d supply, fill #6
  Filled 2023-07-30: qty 60, 30d supply, fill #7
  Filled 2023-08-29: qty 60, 30d supply, fill #8

## 2022-11-05 MED ORDER — GABAPENTIN 100 MG PO CAPS
100.0000 mg | ORAL_CAPSULE | Freq: Three times a day (TID) | ORAL | 3 refills | Status: DC
  Filled 2022-11-05: qty 90, 30d supply, fill #0

## 2022-11-05 MED ORDER — HYDROCHLOROTHIAZIDE 25 MG PO TABS
ORAL_TABLET | Freq: Every day | ORAL | 3 refills | Status: DC
  Filled 2022-11-05: qty 30, 30d supply, fill #0
  Filled 2022-12-09: qty 30, 30d supply, fill #1
  Filled 2023-01-13: qty 30, 30d supply, fill #2
  Filled 2023-02-21: qty 30, 30d supply, fill #3
  Filled 2023-03-22: qty 30, 30d supply, fill #4
  Filled 2023-05-08: qty 30, 30d supply, fill #5
  Filled 2023-06-11: qty 30, 30d supply, fill #6
  Filled 2023-07-30: qty 30, 30d supply, fill #7
  Filled 2023-08-29: qty 30, 30d supply, fill #8
  Filled 2023-09-10: qty 90, 90d supply, fill #9
  Filled 2023-09-27: qty 30, 30d supply, fill #9
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
## 2022-11-05 NOTE — Patient Instructions (Signed)
I am referring you to a surgeon to discuss treatment of your carpal tunnel syndrom  For the current pain you are having  start the prednisone taper  Continue 100 mg tylenol every 12 hours  Add gabapentin 100 mg every 8 hours (or just at night ,  you can increase nighttime dose  to 300 mg if it helps)  Watch for fluid retention

## 2022-11-06 DIAGNOSIS — G5603 Carpal tunnel syndrome, bilateral upper limbs: Secondary | ICD-10-CM | POA: Insufficient documentation

## 2022-11-06 NOTE — Assessment & Plan Note (Signed)
Referring to Poggi at [facility]

## 2022-11-13 ENCOUNTER — Encounter: Payer: Self-pay | Admitting: Dermatology

## 2022-11-13 ENCOUNTER — Ambulatory Visit (INDEPENDENT_AMBULATORY_CARE_PROVIDER_SITE_OTHER): Payer: No Typology Code available for payment source | Admitting: Dermatology

## 2022-11-13 VITALS — BP 138/94 | HR 75

## 2022-11-13 DIAGNOSIS — L918 Other hypertrophic disorders of the skin: Secondary | ICD-10-CM

## 2022-11-13 DIAGNOSIS — L219 Seborrheic dermatitis, unspecified: Secondary | ICD-10-CM

## 2022-11-13 NOTE — Progress Notes (Signed)
   Follow-Up Visit   Subjective  Adam [name] is a 47 y.o. male who presents for the following: Skin Tag (Here for removal of skin tags. Underarm area today). He also has seborrheic dermatitis and is treating with shampoos and topicals and is improving.  The following portions of the chart were reviewed this encounter and updated as appropriate:  Tobacco  Allergies  Meds  Problems  Med Hx  Surg Hx  Fam Hx     Review of Systems: No other skin or systemic complaints except as noted in HPI or Assessment and Plan.  Objective  Well appearing patient in no apparent distress; mood and affect are within normal limits.  A focused examination was performed including axillae. Relevant physical exam findings are noted in the Assessment and Plan.  face and scalp Mild erythema, mild scale   Assessment & Plan   Acrochordons (Skin Tags) - Removal desired by patient - Fleshy, skin-colored pedunculated papules - Benign appearing.  - Patient desires removal. Reviewed that this is not covered by insurance and they will be charged a cosmetic fee for removal. Patient signed non-covered consent.  - Prior to the procedure, reviewed the expected small wound. Also reviewed the risk of leaving a small scar and the small risk of infection.  PROCEDURE - The areas were prepped with isopropyl alcohol. A small amount of lidocaine 1% with epinephrine was injected at the base of each lesion to achieve good local anesthesia. The skin tags were removed using a snip technique. Aluminum chloride was used for hemostasis. Petrolatum and a bandage were applied. The procedure was tolerated well. - Wound care was reviewed with the patient. They were advised to call with any concerns. Total number of treated acrochordons 15.  9 at left axilla, 6 at right axilla.  Seborrheic dermatitis face and scalp Chronic condition with duration or expected duration over one year. Currently well-controlled.  Continue Ketoconazole  2% shampoo washing face/beard 3d/wk, let sit 5 minutes and rinse out Continue HC 2.5% cr 3d/wk to glabella, chin prn flares  Related Medications ketoconazole (NIZORAL) 2 % shampoo Apply 1 Application topically 3 (three) times a week. Wash face, scalp 3 days a week hydrocortisone 2.5 % cream Apply topically 3 (three) times a week. Apply to scaly patches on face 3 nights a week as needed for flares  Return in about 3 months (around 02/11/2023) for skin tag removal.  I, Emelia Salisbury, CMA, am acting as scribe for Sarina Ser, MD. Documentation: I have reviewed the above documentation for accuracy and completeness, and I agree with the above.  Sarina Ser, MD

## 2022-11-13 NOTE — Patient Instructions (Signed)
Wound Care Instructions  Cleanse wound gently with soap and water once a day then pat dry with clean gauze. Apply a thin coat of Petrolatum (petroleum jelly, "Vaseline") over the wound (unless you have an allergy to this). We recommend that you use a new, sterile tube of Vaseline. Do not pick or remove scabs. Do not remove the yellow or white "healing tissue" from the base of the wound.  Cover the wound with fresh, clean, nonstick gauze and secure with paper tape. You may use Band-Aids in place of gauze and tape if the wound is small enough, but would recommend trimming much of the tape off as there is often too much. Sometimes Band-Aids can irritate the skin.  You should call the office for your biopsy report after 1 week if you have not already been contacted.  If you experience any problems, such as abnormal amounts of bleeding, swelling, significant bruising, significant pain, or evidence of infection, please call the office immediately.  FOR ADULT SURGERY PATIENTS: If you need something for pain relief you may take 1 extra strength Tylenol (acetaminophen) AND 2 Ibuprofen (200mg each) together every 4 hours as needed for pain. (do not take these if you are allergic to them or if you have a reason you should not take them.) Typically, you may only need pain medication for 1 to 3 days.     Due to recent changes in healthcare laws, you may see results of your pathology and/or laboratory studies on MyChart before the doctors have had a chance to review them. We understand that in some cases there may be results that are confusing or concerning to you. Please understand that not all results are received at the same time and often the doctors may need to interpret multiple results in order to provide you with the best plan of care or course of treatment. Therefore, we ask that you please give us 2 business days to thoroughly review all your results before contacting the office for clarification. Should  we see a critical lab result, you will be contacted sooner.   If You Need Anything After Your Visit  If you have any questions or concerns for your doctor, please call our main line at [phone] and press option 4 to reach your doctor's medical assistant. If no one answers, please leave a voicemail as directed and we will return your call as soon as possible. Messages left after 4 pm will be answered the following business day.   You may also send us a message via MyChart. We typically respond to MyChart messages within 1-2 business days.  For prescription refills, please ask your pharmacy to contact our office. Our fax number is [phone].  If you have an urgent issue when the clinic is closed that cannot wait until the next business day, you can page your doctor at the number below.    Please note that while we do our best to be available for urgent issues outside of office hours, we are not available 24/7.   If you have an urgent issue and are unable to reach us, you may choose to seek medical care at your doctor's office, retail clinic, urgent care center, or emergency room.  If you have a medical emergency, please immediately call 911 or go to the emergency department.  Pager Numbers  - Dr. Kowalski: [phone]  - Dr. Moye: [phone]  - Dr. Stewart: [phone]  In the event of inclement weather, please call our main line at   [phone] for an update on the status of any delays or closures.  Dermatology Medication Tips: Please keep the boxes that topical medications come in in order to help keep track of the instructions about where and how to use these. Pharmacies typically print the medication instructions only on the boxes and not directly on the medication tubes.   If your medication is too expensive, please contact our office at [phone] option 4 or send us a message through MyChart.   We are unable to tell what your co-pay for medications will be in  advance as this is different depending on your insurance coverage. However, we may be able to find a substitute medication at lower cost or fill out paperwork to get insurance to cover a needed medication.   If a prior authorization is required to get your medication covered by your insurance company, please allow us 1-2 business days to complete this process.  Drug prices often vary depending on where the prescription is filled and some pharmacies may offer cheaper prices.  The website www.goodrx.com contains coupons for medications through different pharmacies. The prices here do not account for what the cost may be with help from insurance (it may be cheaper with your insurance), but the website can give you the price if you did not use any insurance.  - You can print the associated coupon and take it with your prescription to the pharmacy.  - You may also stop by our office during regular business hours and pick up a GoodRx coupon card.  - If you need your prescription sent electronically to a different pharmacy, notify our office through [facility] MyChart or by phone at [phone] option 4.     Si Usted Necesita Algo Despus de Su Visita  Tambin puede enviarnos un mensaje a travs de MyChart. Por lo general respondemos a los mensajes de MyChart en el transcurso de 1 a 2 das hbiles.  Para renovar recetas, por favor pida a su farmacia que se ponga en contacto con nuestra oficina. Nuestro nmero de fax es el [phone].  Si tiene un asunto urgente cuando la clnica est cerrada y que no puede esperar hasta el siguiente da hbil, puede llamar/localizar a su doctor(a) al nmero que aparece a continuacin.   Por favor, tenga en cuenta que aunque hacemos todo lo posible para estar disponibles para asuntos urgentes fuera del horario de oficina, no estamos disponibles las 24 horas del da, los 7 das de la semana.   Si tiene un problema urgente y no puede comunicarse con nosotros, puede  optar por buscar atencin mdica  en el consultorio de su doctor(a), en una clnica privada, en un centro de atencin urgente o en una sala de emergencias.  Si tiene una emergencia mdica, por favor llame inmediatamente al 911 o vaya a la sala de emergencias.  Nmeros de bper  - Dr. Kowalski: [phone]  - Dra. Moye: [phone]  - Dra. Stewart: [phone]  En caso de inclemencias del tiempo, por favor llame a nuestra lnea principal al [phone] para una actualizacin sobre el estado de cualquier retraso o cierre.  Consejos para la medicacin en dermatologa: Por favor, guarde las cajas en las que vienen los medicamentos de uso tpico para ayudarle a seguir las instrucciones sobre dnde y cmo usarlos. Las farmacias generalmente imprimen las instrucciones del medicamento slo en las cajas y no directamente en los tubos del medicamento.   Si su medicamento es muy caro, por favor, pngase en contacto con   nuestra oficina llamando al [phone] y presione la opcin 4 o envenos un mensaje a travs de MyChart.   No podemos decirle cul ser su copago por los medicamentos por adelantado ya que esto es diferente dependiendo de la cobertura de su seguro. Sin embargo, es posible que podamos encontrar un medicamento sustituto a menor costo o llenar un formulario para que el seguro cubra el medicamento que se considera necesario.   Si se requiere una autorizacin previa para que su compaa de seguros cubra su medicamento, por favor permtanos de 1 a 2 das hbiles para completar este proceso.  Los precios de los medicamentos varan con frecuencia dependiendo del lugar de dnde se surte la receta y alguna farmacias pueden ofrecer precios ms baratos.  El sitio web www.goodrx.com tiene cupones para medicamentos de diferentes farmacias. Los precios aqu no tienen en cuenta lo que podra costar con la ayuda del seguro (puede ser ms barato con su seguro), pero el sitio web puede darle el  precio si no utiliz ningn seguro.  - Puede imprimir el cupn correspondiente y llevarlo con su receta a la farmacia.  - Tambin puede pasar por nuestra oficina durante el horario de atencin regular y recoger una tarjeta de cupones de GoodRx.  - Si necesita que su receta se enve electrnicamente a una farmacia diferente, informe a nuestra oficina a travs de MyChart de [place] o por telfono llamando al [phone] y presione la opcin 4.  

## 2022-11-18 ENCOUNTER — Encounter: Payer: Self-pay | Admitting: Dermatology

## 2022-11-29 ENCOUNTER — Other Ambulatory Visit: Payer: Self-pay

## 2022-12-07 ENCOUNTER — Other Ambulatory Visit: Payer: Self-pay

## 2022-12-10 ENCOUNTER — Other Ambulatory Visit: Payer: Self-pay

## 2022-12-12 ENCOUNTER — Ambulatory Visit: Payer: No Typology Code available for payment source | Admitting: Dermatology

## 2022-12-19 ENCOUNTER — Encounter: Payer: Self-pay | Admitting: Internal Medicine

## 2022-12-20 ENCOUNTER — Other Ambulatory Visit: Payer: Self-pay

## 2022-12-20 MED ORDER — TIRZEPATIDE 5 MG/0.5ML ~~LOC~~ SOAJ
5.0000 mg | SUBCUTANEOUS | 2 refills | Status: DC
  Filled 2022-12-20: qty 2, 28d supply, fill #0
  Filled 2023-02-07: qty 2, 28d supply, fill #1
  Filled 2023-02-22: qty 2, 28d supply, fill #2

## 2022-12-25 ENCOUNTER — Encounter: Payer: Self-pay | Admitting: Pharmacist

## 2022-12-31 ENCOUNTER — Encounter: Payer: Self-pay | Admitting: Internal Medicine

## 2022-12-31 ENCOUNTER — Ambulatory Visit (INDEPENDENT_AMBULATORY_CARE_PROVIDER_SITE_OTHER): Payer: No Typology Code available for payment source | Admitting: Internal Medicine

## 2022-12-31 VITALS — BP 128/74 | HR 67 | Temp 97.7°F | Ht 75.0 in | Wt 291.4 lb

## 2022-12-31 DIAGNOSIS — Z6837 Body mass index (BMI) 37.0-37.9, adult: Secondary | ICD-10-CM

## 2022-12-31 DIAGNOSIS — G5603 Carpal tunnel syndrome, bilateral upper limbs: Secondary | ICD-10-CM

## 2022-12-31 DIAGNOSIS — E118 Type 2 diabetes mellitus with unspecified complications: Secondary | ICD-10-CM | POA: Diagnosis not present

## 2022-12-31 DIAGNOSIS — K760 Fatty (change of) liver, not elsewhere classified: Secondary | ICD-10-CM | POA: Diagnosis not present

## 2022-12-31 LAB — COMPREHENSIVE METABOLIC PANEL
ALT: 77 U/L — ABNORMAL HIGH (ref 0–53)
AST: 31 U/L (ref 0–37)
Albumin: 4.2 g/dL (ref 3.5–5.2)
Alkaline Phosphatase: 68 U/L (ref 39–117)
BUN: 16 mg/dL (ref 6–23)
CO2: 30 mEq/L (ref 19–32)
Calcium: 9.8 mg/dL (ref 8.4–10.5)
Chloride: 102 mEq/L (ref 96–112)
Creatinine, Ser: 1.21 mg/dL (ref 0.40–1.50)
GFR: 71.52 mL/min (ref >60.00)
Glucose, Bld: 84 mg/dL (ref 70–99)
Potassium: 3.8 mEq/L (ref 3.5–5.1)
Sodium: 139 mEq/L (ref 135–145)
Total Bilirubin: 0.7 mg/dL (ref 0.2–1.2)
Total Protein: 7.1 g/dL (ref 6.0–8.3)

## 2022-12-31 LAB — HEMOGLOBIN A1C: Hgb A1c MFr Bld: 5.7 % (ref 4.6–6.5)

## 2022-12-31 NOTE — Assessment & Plan Note (Signed)
He has lost 30 lbs on his own  with  diet and exercise.  He is tolerating Mounjaro but weight has plateaued .  Will increase dose to 5 mg

## 2022-12-31 NOTE — Progress Notes (Signed)
Subjective:  Patient ID: Adam [name], male    DOB: 17-Apr-1976  Age: 47 y.o. MRN: [id number]  CC: The primary encounter diagnosis was Controlled type 2 diabetes mellitus with complication, without long-term current use of insulin. Diagnoses of Fatty liver disease, nonalcoholic, Carpal tunnel syndrome on both sides, and Class 2 severe obesity due to excess calories with serious comorbidity and body mass index (BMI) of 37.0 to 37.9 in adult were also pertinent to this visit.   HPI Adam Bussinglex D Addair presents for  Chief Complaint  Patient presents with   Medical Management of Chronic Issues    3 month follow up    Labs Only    1) obesity,  diabetes and hypertension:   He feels generally well, is exercising several times per week and checking blood sugars once daily at variable times.  BS have been under 130 fasting and < 150 post prandially.  Denies any recent hypoglyemic events.  Taking his medications as directed. Following a carbohydrate modified diet 6 days per week. Denies numbness, burning and tingling of extremities. Appetite is fiar,  not as well controlled as with ozempic     he has been taking mounjaro 2.5 m g weekly   and telmisartan,/hct.  Weight loss thus far:  7 lbs since February.  Down 31 lbs since 2022  (lost 30 on his own ) LESS APPETITE CONTROL  than with ozempic and has to limit his appetite .   .ttdibhist  2) CTS:  surgery is  planned on left wrist May 1.  He has been    Outpatient Medications Prior to Visit  Medication Sig Dispense Refill   albuterol (VENTOLIN HFA) 108 (90 Base) MCG/ACT inhaler Inhale 2 puffs into the lungs every 4 (four) hours as needed for wheezing. 6.7 g 2   aspirin 81 MG tablet Take 81 mg by mouth daily.     carvedilol (COREG) 6.25 MG tablet TAKE 1 TABLET BY MOUTH 2 TIMES DAILY WITH A MEAL. 180 tablet 2   gabapentin (NEURONTIN) 100 MG capsule Take 1 capsule (100 mg total) by mouth 3 (three) times daily. 90 capsule 3   hydrochlorothiazide (HYDRODIURIL)  25 MG tablet TAKE 1 TABLET BY MOUTH DAILY. 90 tablet 3   hydrocortisone 2.5 % cream Apply topically 3 (three) times a week. Apply to scaly patches on face 3 nights a week as needed for flares 60 g 3   ketoconazole (NIZORAL) 2 % shampoo Apply 1 Application topically 3 (three) times a week. Wash face, scalp 3 days a week 120 mL 3   metFORMIN (GLUCOPHAGE-XR) 500 MG 24 hr tablet Take 1 tablet (500 mg total) by mouth daily with breakfast. 90 tablet 1   Multiple Vitamins-Minerals (MULTIVITAMIN WITH MINERALS) tablet Take 1 tablet by mouth daily.     telmisartan (MICARDIS) 80 MG tablet Take 1 tablet (80 mg total) by mouth daily. 90 tablet 1   terbinafine (LAMISIL) 250 MG tablet Take 1 tablet (250 mg total) by mouth daily. 90 tablet 0   tirzepatide (MOUNJARO) 5 MG/0.5ML Pen Inject 5 mg into the skin once a week. 2 mL 2   No facility-administered medications prior to visit.    Review of Systems;  Patient denies headache, fevers, malaise, unintentional weight loss, skin rash, eye pain, sinus congestion and sinus pain, sore throat, dysphagia,  hemoptysis , cough, dyspnea, wheezing, chest pain, palpitations, orthopnea, edema, abdominal pain, nausea, melena, diarrhea, constipation, flank pain, dysuria, hematuria, urinary  Frequency, nocturia, numbness, tingling, seizures,  Focal weakness, Loss of consciousness,  Tremor, insomnia, depression, anxiety, and suicidal ideation.      Objective:  BP 128/74   Pulse 67   Temp 97.7 F (36.5 C) (Oral)   Ht 6\' 3"  (1.905 m)   Wt 291 lb 6.4 oz (132.2 kg)   SpO2 96%   BMI 36.42 kg/m   BP Readings from Last 3 Encounters:  12/31/22 128/74  11/13/22 (!) 138/94  11/05/22 120/86    Wt Readings from Last 3 Encounters:  12/31/22 291 lb 6.4 oz (132.2 kg)  11/05/22 298 lb 12.8 oz (135.5 kg)  10/01/22 293 lb 6.4 oz (133.1 kg)    Physical Exam Vitals reviewed.  Constitutional:      General: He is not in acute distress.    Appearance: Normal appearance. He is  obese. He is not ill-appearing, toxic-appearing or diaphoretic.  HENT:     Head: Normocephalic.  Eyes:     General: No scleral icterus.       Right eye: No discharge.        Left eye: No discharge.     Conjunctiva/sclera: Conjunctivae normal.  Cardiovascular:     Rate and Rhythm: Normal rate and regular rhythm.     Heart sounds: Normal heart sounds.  Pulmonary:     Effort: Pulmonary effort is normal. No respiratory distress.     Breath sounds: Normal breath sounds.  Musculoskeletal:        General: Normal range of motion.     Cervical back: Normal range of motion.  Skin:    General: Skin is warm and dry.  Neurological:     General: No focal deficit present.     Mental Status: He is alert and oriented to person, place, and time. Mental status is at baseline.  Psychiatric:        Mood and Affect: Mood normal.        Behavior: Behavior normal.        Thought Content: Thought content normal.        Judgment: Judgment normal.     Lab Results  Component Value Date   HGBA1C 5.7 12/31/2022   HGBA1C 5.6 10/01/2022   HGBA1C 6.3 10/10/2021    Lab Results  Component Value Date   CREATININE 1.21 12/31/2022   CREATININE 1.22 10/01/2022   CREATININE 1.22 10/10/2021    Lab Results  Component Value Date   WBC 7.7 10/01/2022   HGB 16.7 10/01/2022   HCT 50.0 10/01/2022   PLT 255.0 10/01/2022   GLUCOSE 84 12/31/2022   CHOL 142 10/01/2022   TRIG 97.0 10/01/2022   HDL 46.50 10/01/2022   LDLDIRECT 83.0 10/01/2022   LDLCALC 76 10/01/2022   ALT 77 (H) 12/31/2022   AST 31 12/31/2022   NA 139 12/31/2022   K 3.8 12/31/2022   CL 102 12/31/2022   CREATININE 1.21 12/31/2022   BUN 16 12/31/2022   CO2 30 12/31/2022   TSH 0.95 10/01/2022   PSA 0.77 10/02/2022   HGBA1C 5.7 12/31/2022   MICROALBUR 1.2 10/01/2022    US ABDOMEN LIMITED RUQ  Result Date: 08/26/2018 CLINICAL DATA:  Nonalcoholic fatty liver disease. Elevated liver enzymes. EXAM: ULTRASOUND ABDOMEN LIMITED RIGHT UPPER  QUADRANT COMPARISON:  01/03/2012 FINDINGS: Gallbladder: Gallbladder has a normal appearance. Gallbladder wall is 1.8 millimeters, within normal limits. No stones or pericholecystic fluid. No sonographic Murphy's sign. Common bile duct: Diameter: 4.0 millimeters Liver: The liver is echogenic. There is attenuation of the ultrasound wave, poor visualization of the  internal hepatic architecture, and loss of definition of the diaphragm. There is question mild nodularity along the surface of the liver which can be seen in cirrhosis. There is a focal area of relatively hypoechoic liver parenchyma adjacent to the gallbladder fossa, likely representing focal fatty sparing. No discrete focal lesion identified. Portal vein is patent on color Doppler imaging with normal direction of blood flow towards the liver. IMPRESSION: 1. No evidence for acute cholecystitis. 2. Findings consistent with hepatic steatosis. 3. Question early cirrhotic changes. Electronically Signed   By: Norva Pavlov M.D.   On: 08/26/2018 09:21    Assessment & Plan:  .Controlled type 2 diabetes mellitus with complication, without long-term current use of insulin Assessment & Plan: Diagnosed with A1c of 6.4, fasting glucose of 124.  Complicated by hypertension fatty liver, obesity and OSA.    He has resumed Mounjaro after failing a trial of metformin alone.  Lab Results  Component Value Date   HGBA1C 5.6 10/01/2022     Orders: -     Hemoglobin A1c -     Comprehensive metabolic panel  Fatty liver disease, nonalcoholic Assessment & Plan: He is an alpha 1 antitrypsin mutation carrier,  based on previously reported labs  and GI evaluation by Dr Servando Snare.  No biopys was done. He was referred to a nutritionist,  but thus far only GLP 1 agonists have been successful in achieving and maintaining weight loss.   Orders: -     US ABDOMEN LIMITED RUQ (LIVER/GB); Future -     AFP tumor marker  Carpal tunnel syndrome on both sides Assessment &  Plan: For serial surgeries this summer    Class 2 severe obesity due to excess calories with serious comorbidity and body mass index (BMI) of 37.0 to 37.9 in adult Assessment & Plan: He has lost 30 lbs on his own  with  diet and exercise.  He is tolerating Mounjaro but weight has plateaued .  Will increase dose to 5 mg      Follow-up: No follow-ups on file.   Sherlene Shams, MD

## 2022-12-31 NOTE — Patient Instructions (Signed)
You are doing great!  Let me know if your weight plateaus on the 5 mg Mounjaro dose  Your surgeries  may require temporary suspension  if general anaesthesia is required ,  so please check    Ultrasound of liver ordered

## 2022-12-31 NOTE — Assessment & Plan Note (Signed)
He is an alpha 1 antitrypsin mutation carrier,  based on previously reported labs  and GI evaluation by Dr Wohl.  No biopys was done. He was referred to a nutritionist,  but thus far only GLP 1 agonists have been successful in achieving and maintaining weight loss.  

## 2022-12-31 NOTE — Assessment & Plan Note (Signed)
Diagnosed with A1c of 6.4, fasting glucose of 124.  Complicated by hypertension fatty liver, obesity and OSA.    He has resumed Mounjaro after failing a trial of metformin alone.  Lab Results  Component Value Date   HGBA1C 5.6 10/01/2022

## 2022-12-31 NOTE — Assessment & Plan Note (Signed)
For serial surgeries this summer

## 2023-01-02 LAB — AFP TUMOR MARKER: AFP-Tumor Marker: 1.8 ng/mL (ref <6.1)

## 2023-01-02 NOTE — Addendum Note (Signed)
Addended by: Sherlene Shams on: 01/02/2023 05:42 PM   Modules accepted: Orders

## 2023-01-07 ENCOUNTER — Encounter: Payer: Self-pay | Admitting: Podiatry

## 2023-01-07 ENCOUNTER — Ambulatory Visit (INDEPENDENT_AMBULATORY_CARE_PROVIDER_SITE_OTHER): Payer: No Typology Code available for payment source | Admitting: Podiatry

## 2023-01-07 DIAGNOSIS — M79675 Pain in left toe(s): Secondary | ICD-10-CM | POA: Diagnosis not present

## 2023-01-07 DIAGNOSIS — B351 Tinea unguium: Secondary | ICD-10-CM | POA: Diagnosis not present

## 2023-01-07 DIAGNOSIS — M79674 Pain in right toe(s): Secondary | ICD-10-CM

## 2023-01-07 NOTE — Progress Notes (Signed)
  Subjective:  Patient ID: Adam [name], male    DOB: 1976-07-07,  MRN: [id number]  Chief Complaint  Patient presents with   Nail Problem    "They're doing better.  The nails are getting clearer and the skin is getting smoother."    47 y.o. male presents with the above complaint. History confirmed with patient.  He is seeing quite a bit of improvement.  He had some lab work done recently  Objective:  Physical Exam: warm, good capillary refill, no trophic changes or ulcerative lesions, normal DP and PT pulses, normal sensory exam, and onychomycosis, proximal clearing noted.       ALT on 12/31/2022 elevated to 77 up from 58   Assessment:   1. Onychomycosis      Plan:  Patient was evaluated and treated and all questions answered.  Onychomycosis -Has had quite a bit of improvement.  He does have some elevation in his ALT.  He is due to have this rechecked in a few weeks.  I discussed with him and his PCP we should continue terbinafine he does seem to be responding well to it.  Nails were debrided in length and thickness using a sharp nail nipper today to patient's tolerance.  I will see him back in 4 months for follow-up  Return in about 4 months (around 05/09/2023) for follow up after nail fungus treatment.

## 2023-01-09 ENCOUNTER — Other Ambulatory Visit: Payer: Self-pay | Admitting: Surgery

## 2023-01-14 ENCOUNTER — Ambulatory Visit
Admission: RE | Admit: 2023-01-14 | Discharge: 2023-01-14 | Disposition: A | Discharge status: Home / Self Care | Payer: No Typology Code available for payment source | Source: Ambulatory Visit | Attending: Internal Medicine | Admitting: Internal Medicine

## 2023-01-14 DIAGNOSIS — K760 Fatty (change of) liver, not elsewhere classified: Secondary | ICD-10-CM | POA: Diagnosis present

## 2023-01-15 ENCOUNTER — Other Ambulatory Visit: Payer: Self-pay

## 2023-01-15 ENCOUNTER — Inpatient Hospital Stay: Admission: RE | Admit: 2023-01-15 | Payer: No Typology Code available for payment source | Source: Ambulatory Visit

## 2023-01-15 ENCOUNTER — Encounter
Admission: RE | Admit: 2023-01-15 | Discharge: 2023-01-15 | Disposition: A | Discharge status: Home / Self Care | Payer: No Typology Code available for payment source | Source: Ambulatory Visit | Attending: Surgery | Admitting: Surgery

## 2023-01-15 DIAGNOSIS — E118 Type 2 diabetes mellitus with unspecified complications: Secondary | ICD-10-CM

## 2023-01-15 DIAGNOSIS — Z01812 Encounter for preprocedural laboratory examination: Secondary | ICD-10-CM

## 2023-01-15 DIAGNOSIS — I10 Essential (primary) hypertension: Secondary | ICD-10-CM

## 2023-01-15 HISTORY — DX: Myoneural disorder, unspecified: G70.9

## 2023-01-15 HISTORY — DX: Type 2 diabetes mellitus without complications: E11.9

## 2023-01-15 NOTE — Patient Instructions (Addendum)
Your procedure is scheduled on: 01/23/23 - Wednesday Report to the Registration Desk on the 1st floor of the [facility]. To find out your arrival time, please call [phone] between 1PM - 3PM on: 01/22/23 - Tuesday If your arrival time is 6:00 am, do not arrive before that time as the [facility] entrance doors do not open until 6:00 am.  REMEMBER: Instructions that are not followed completely may result in serious medical risk, up to and including death; or upon the discretion of your surgeon and anesthesiologist your surgery may need to be rescheduled.  Do not eat food after midnight the night before surgery.  No gum chewing or hard candies.  You may however, drink CLEAR liquids up to 2 hours before you are scheduled to arrive for your surgery. Do not drink anything within 2 hours of your scheduled arrival time.  Clear liquids include: - water   In addition, your doctor has ordered for you to drink the provided:  Gatorade G2 Drinking this carbohydrate drink up to two hours before surgery helps to reduce insulin resistance and improve patient outcomes. Please complete drinking 2 hours before scheduled arrival time.  One week prior to surgery: Stop Anti-inflammatories (NSAIDS) such as Advil, Aleve, Ibuprofen, Motrin, Naproxen, Naprosyn and Aspirin based products such as Excedrin, Goody's Powder, BC Powder.  Stop 7 days prior to your surgery ANY OVER THE COUNTER supplements until after surgery.  You may take Tylenol if needed for pain up until the day of surgery.  Continue taking all prescribed medications with the exception of the following:  tirzepatide [facility]) hold for 7 days prior to your surgery,   TAKE ONLY THESE MEDICATIONS THE MORNING OF SURGERY WITH A SIP OF WATER:  carvedilol (COREG   Use albuterol (VENTOLIN HFA)  on the day of surgery and bring to the hospital.  No Alcohol for 24 hours before or after surgery.  No Smoking including e-cigarettes for 24  hours before surgery.  No chewable tobacco products for at least 6 hours before surgery.  No nicotine patches on the day of surgery.  Do not use any "recreational" drugs for at least a week (preferably 2 weeks) before your surgery.  Please be advised that the combination of cocaine and anesthesia may have negative outcomes, up to and including death. If you test positive for cocaine, your surgery will be cancelled.  On the morning of surgery brush your teeth with toothpaste and water, you may rinse your mouth with mouthwash if you wish. Do not swallow any toothpaste or mouthwash.  Use CHG Soap or wipes as directed on instruction sheet.  Do not wear jewelry, make-up, hairpins, clips or nail polish.  Do not wear lotions, powders, or perfumes.   Do not shave body hair from the neck down 48 hours before surgery.  Contact lenses, hearing aids and dentures may not be worn into surgery.  Do not bring valuables to the hospital. [facility] is not responsible for any missing/lost belongings or valuables.   Bring your C-PAP to the hospital in case you may have to spend the night.   Notify your doctor if there is any change in your medical condition (cold, fever, infection).  Wear comfortable clothing (specific to your surgery type) to the hospital.  After surgery, you can help prevent lung complications by doing breathing exercises.  Take deep breaths and cough every 1-2 hours. Your doctor may order a device called an Incentive Spirometer to help you take deep breaths. When  coughing or sneezing, hold a pillow firmly against your incision with both hands. This is called "splinting." Doing this helps protect your incision. It also decreases belly discomfort.  If you are being admitted to the hospital overnight, leave your suitcase in the car. After surgery it may be brought to your room.  In case of increased patient census, it may be necessary for you, the patient, to continue your  postoperative care in the Same Day Surgery department.  If you are being discharged the day of surgery, you will not be allowed to drive home. You will need a responsible individual to drive you home and stay with you for 24 hours after surgery.   If you are taking public transportation, you will need to have a responsible individual with you.  Please call the Pre-admissions Testing Dept. at [phone] if you have any questions about these instructions.  Surgery Visitation Policy:  Patients having surgery or a procedure may have two visitors.  Children under the age of 32 must have an adult with them who is not the patient.  Inpatient Visitation:    Visiting hours are 7 a.m. to 8 p.m. Up to four visitors are allowed at one time in a patient room. The visitors may rotate out with other people during the day.  One visitor age 39 or older may stay with the patient overnight and must be in the room by 8 p.m.

## 2023-01-16 ENCOUNTER — Encounter: Payer: Self-pay | Admitting: Internal Medicine

## 2023-01-16 DIAGNOSIS — K760 Fatty (change of) liver, not elsewhere classified: Secondary | ICD-10-CM

## 2023-01-17 ENCOUNTER — Ambulatory Visit: Payer: No Typology Code available for payment source | Admitting: Dermatology

## 2023-01-17 ENCOUNTER — Encounter
Admission: RE | Admit: 2023-01-17 | Discharge: 2023-01-17 | Disposition: A | Discharge status: Home / Self Care | Payer: No Typology Code available for payment source | Source: Ambulatory Visit | Attending: Surgery | Admitting: Surgery

## 2023-01-17 DIAGNOSIS — I10 Essential (primary) hypertension: Secondary | ICD-10-CM | POA: Insufficient documentation

## 2023-01-17 DIAGNOSIS — Z01818 Encounter for other preprocedural examination: Secondary | ICD-10-CM | POA: Insufficient documentation

## 2023-01-17 DIAGNOSIS — Z01812 Encounter for preprocedural laboratory examination: Secondary | ICD-10-CM

## 2023-01-17 LAB — CBC
HCT: 53.9 % — ABNORMAL HIGH (ref 39.0–52.0)
Hemoglobin: 17.8 g/dL — ABNORMAL HIGH (ref 13.0–17.0)
MCH: 30 pg (ref 26.0–34.0)
MCHC: 33 g/dL (ref 30.0–36.0)
MCV: 90.7 fL (ref 80.0–100.0)
Platelets: 250 10*3/uL (ref 150–400)
RBC: 5.94 MIL/uL — ABNORMAL HIGH (ref 4.22–5.81)
RDW: 11.9 % (ref 11.5–15.5)
WBC: 5.6 10*3/uL (ref 4.0–10.5)
nRBC: 0 % (ref 0.0–0.2)

## 2023-01-17 NOTE — Telephone Encounter (Signed)
He will not be considered an established pt because it has been two years since last seen. He will need a new referral to [facility].

## 2023-01-21 ENCOUNTER — Encounter: Payer: No Typology Code available for payment source | Admitting: Podiatry

## 2023-01-21 NOTE — Progress Notes (Unsigned)
Celso Amy, PA-C [address]  [address]  [place], [place] [address]  Main: [phone]  Fax: [phone]   Primary Care Physician: Sherlene Shams, MD  Primary Gastroenterologist:  Dr. Midge Minium   No chief complaint on file.   HPI: Adam [name] is a 47 y.o. male, who last saw Dr. Servando Snare for elevated LFTs 02/2021 (attributed to fatty liver), presents for...  Has history of fatty liver disease and obesity.  Is also a carrier of 1 gene of alpha-1-antitrypsin deficiency.  Other workup for elevated LFTs Negative.  HCV Negative.  Labs in 2019 showed he is Immune to Hepatitis A & B.    Alcohol use: GI Symptoms:  Most recent Labs 12/31/22 showed AST 31, ALT 77, T. Bili 0.7, Alk Phos 68.  Liver Transaminases stable and improved.  Normal AFP 1.8.  Hemoglobin A1C 5.7.  Recent CBC Normal.  Hg 17.8g, Plt 250.  Last RUQ Ultrasound 12/2022 showed mild hepatic nodularity (concerning for cirrhosis), and hepatic steatosis.  NO liver masses.  No evidence of gallstones.  He had evidence of hepatic steatosis on CT in 2013.  He is age 81 and has never had a Colonoscopy.  He is due for his first screening colonoscopy.    Current Outpatient Medications  Medication Sig Dispense Refill   acetaminophen (TYLENOL) 500 MG tablet Take 1,000 mg by mouth every 6 (six) hours as needed for mild pain.     albuterol (VENTOLIN HFA) 108 (90 Base) MCG/ACT inhaler Inhale 2 puffs into the lungs every 4 (four) hours as needed for wheezing. 6.7 g 2   aspirin 81 MG tablet Take 81 mg by mouth daily.     carvedilol (COREG) 6.25 MG tablet TAKE 1 TABLET BY MOUTH 2 TIMES DAILY WITH A MEAL. 180 tablet 2   cholecalciferol (VITAMIN D3) 25 MCG (1000 UNIT) tablet Take 1,000 Units by mouth daily.     gabapentin (NEURONTIN) 100 MG capsule Take 1 capsule (100 mg total) by mouth 3 (three) times daily. (Patient taking differently: Take 100 mg by mouth as needed.) 90 capsule 3   hydrochlorothiazide (HYDRODIURIL) 25 MG tablet  TAKE 1 TABLET BY MOUTH DAILY. 90 tablet 3   hydrocortisone 2.5 % cream Apply topically 3 (three) times a week. Apply to scaly patches on face 3 nights a week as needed for flares 60 g 3   ibuprofen (ADVIL) 200 MG tablet Take 200 mg by mouth every 6 (six) hours as needed.     ketoconazole (NIZORAL) 2 % shampoo Apply 1 Application topically 3 (three) times a week. Wash face, scalp 3 days a week 120 mL 3   Multiple Vitamins-Minerals (MULTIVITAMIN WITH MINERALS) tablet Take 1 tablet by mouth daily.     OVER THE COUNTER MEDICATION as needed. Milk Thistle     telmisartan (MICARDIS) 80 MG tablet Take 1 tablet (80 mg total) by mouth daily. 90 tablet 1   terbinafine (LAMISIL) 250 MG tablet Take 1 tablet (250 mg total) by mouth daily. 90 tablet 0   tirzepatide (MOUNJARO) 5 MG/0.5ML Pen Inject 5 mg into the skin once a week. 2 mL 2   No current facility-administered medications for this visit.    Allergies as of 01/22/2023   (No Known Allergies)      ROS:  General: Negative for anorexia, weight loss, fever, chills, fatigue, weakness. ENT: Negative for hoarseness, difficulty swallowing , nasal congestion. CV: Negative for chest pain, angina, palpitations, dyspnea on exertion, peripheral edema.  Respiratory:  Negative for dyspnea at rest, dyspnea on exertion, cough, sputum, wheezing.  GI: See history of present illness. GU:  Negative for dysuria, hematuria, urinary incontinence, urinary frequency, nocturnal urination.  Endo: Negative for unusual weight change.    Physical Examination:   There were no vitals taken for this visit.  General: Well-nourished, well-developed in no acute distress.  Eyes: No icterus. Conjunctivae pink. Mouth: Oropharyngeal mucosa moist and pink , no lesions erythema or exudate. Lungs: Clear to auscultation bilaterally. Non-labored. Heart: Regular rate and rhythm, no murmurs rubs or gallops.  Abdomen: Bowel sounds are normal, nontender, nondistended, no  hepatosplenomegaly or masses, no abdominal bruits or hernia , no rebound or guarding.   Extremities: No lower extremity edema. No clubbing or deformities. Neuro: Alert and oriented x 3.  Grossly intact. Skin: Warm and dry, no jaundice.   Psych: Alert and cooperative, normal mood and affect.   Imaging Studies: US Abdomen Limited RUQ (LIVER/GB)  Result Date: 01/14/2023 CLINICAL DATA:  Fatty liver. EXAM: ULTRASOUND ABDOMEN LIMITED RIGHT UPPER QUADRANT COMPARISON:  Ultrasound abdomen 08/26/2018 FINDINGS: Gallbladder: No gallstones or wall thickening visualized. No sonographic Murphy sign noted by sonographer. Common bile duct: Diameter: 3.8 mm Liver: Diffusely increased echogenicity. Probable mild subtle hepatic nodularity. No focal lesion. Portal vein is patent on color Doppler imaging with normal direction of blood flow towards the liver. Other: None. IMPRESSION: 1. Probable mild hepatic nodularity. Correlate for cirrhosis. Hepatic steatosis. No focal lesion. 2. No cholelithiasis or sonographic evidence for acute cholecystitis. Electronically Signed   By: Annia Belt M.D.   On: 01/14/2023 08:18    Assessment and Plan:   Adam [name] is a 47 y.o. y/o male ***  NAFLD; NASH; Early compensated cirrhosis  Prior labs showed immunity to Hep A & B.  RUQ [place] 12/2022 Negative for Hepatoma; AFP Negative  New Med for NAFLD discussed  Low Fat Diet, Regular Exercise, Weight Loss  Colon Cancer Screening  Schedule Colonoscopy  I have discussed risks & benefits of the procedure  which include, but are not limited to, bleeding, infection, perforation,respiratory compromise & drug reaction.  The patient agrees with this plan & written consent will be obtained.     Celso Amy, PA-C  Follow up in ***  BP check ***

## 2023-01-22 ENCOUNTER — Encounter: Payer: Self-pay | Admitting: Physician Assistant

## 2023-01-22 ENCOUNTER — Ambulatory Visit (INDEPENDENT_AMBULATORY_CARE_PROVIDER_SITE_OTHER): Payer: No Typology Code available for payment source | Admitting: Physician Assistant

## 2023-01-22 ENCOUNTER — Other Ambulatory Visit: Payer: Self-pay

## 2023-01-22 VITALS — BP 122/75 | HR 71 | Temp 98.6°F | Ht 75.0 in | Wt 290.6 lb

## 2023-01-22 DIAGNOSIS — Z1211 Encounter for screening for malignant neoplasm of colon: Secondary | ICD-10-CM

## 2023-01-22 DIAGNOSIS — K760 Fatty (change of) liver, not elsewhere classified: Secondary | ICD-10-CM

## 2023-01-22 DIAGNOSIS — E8801 Alpha-1-antitrypsin deficiency: Secondary | ICD-10-CM | POA: Diagnosis not present

## 2023-01-22 MED ORDER — PEG 3350-KCL-NA BICARB-NACL 420 G PO SOLR
4000.0000 mL | Freq: Once | ORAL | 0 refills | Status: AC
  Filled 2023-01-22: qty 4000, 1d supply, fill #0

## 2023-01-22 NOTE — Progress Notes (Signed)
  Perioperative Services Pre-Admission/Anesthesia Testing    Date: 01/22/23  Name: Adam [name] MRN:   [id number]  Re: GLP-1 clearance and provider recommendations   Planned Surgical Procedure(s):    Case: [id number] Date/Time: 01/23/23 1111   Procedure: CARPAL TUNNEL RELEASE ENDOSCOPIC (Left: Wrist)   Anesthesia type: Choice   Pre-op diagnosis: CARPAL TUNNEL SYNDROME   Location: [facility] OR ROOM 02 / [facility]   Surgeons: Christena Flake, MD      Clinical Notes:  Patient is scheduled for the above procedure with the indicated provider/surgeon. In review of his medication reconciliation it was noted that patient is on a prescribed GLP-1 medication. Per guidelines issued by the American Society of Anesthesiologists (ASA), it is recommended that these medications be held for 7 days prior to the patient undergoing any type of elective surgical procedure. The patient is taking the following GLP-1 medication:  []  SEMAGLUTIDE   []  EXENATIDE  []  LIRAGLUTIDE   []  LIXISENATIDE  []  DULAGLUTIDE     [x]  OTHER GLP-1/GIP: tirzepatide  Reached out to prescribing provider Darrick Huntsman, MD) to make them aware of the guidelines from anesthesia. Given that this patient takes the prescribed GLP-1 medication for his  diabetes diagnosis, rather than for weight loss, recommendations from the prescribing provider were solicited. Prescribing provider made aware of the following so that informed decision/POC can be developed for this patient that may be taking medications belonging to these drug classes:  Oral GLP-1 medications will be held 1 day prior to surgery.  Injectable GLP-1 medications will be held 7 days prior to surgery.  Metformin is routinely held 48 hours prior to surgery due to renal concerns, potential need for contrasted imaging perioperatively, and the potential for tissue hypoxia leading to drug induced lactic acidosis.  All SGLT2i medications are held 72 hours prior to surgery  as they can be associated with the increased potential for developing euglycemic diabetic ketoacidosis (EDKA).   Impression and Plan:  [name] is on a prescribed GLP-1 medication, which induces the known side effect of decreased gastric emptying. Efforts are bring made to mitigate the risk of perioperative hyperglycemic events, as elevated blood glucose levels have been found to contribute to intra/postoperative complications. Additionally, hyperglycemic extremes can potentially necessitate the postponing of a patient's elective case in order to better optimize perioperative glycemic control, again with the aforementioned guidelines in place. With this in mind, recommendations have been sought from the prescribing provider, who has cleared patient to proceed with holding the prescribed GLP-1/GIP (tirzepatide) as per the guidelines from the ASA.   Provider recommending: no further recommendations received from the prescribing provider.  Copy of signed clearance and recommendations placed on patient's chart for inclusion in their medical record and for review by the surgical/anesthetic team on the day of his procedure.   Quentin Mulling, MSN, APRN, FNP-C, CEN [facility]  Peri-operative Services Nurse Practitioner Phone: [phone] 01/22/23 5:14 PM  NOTE: This note has been prepared using Dragon dictation software. Despite my best ability to proofread, there is always the potential that unintentional transcriptional errors may still occur from this process.

## 2023-01-22 NOTE — Patient Instructions (Signed)
Please follow your bowel prep instructions.  

## 2023-01-23 ENCOUNTER — Encounter
Admission: RE | Disposition: A | Discharge status: Home / Self Care | Payer: Self-pay | Source: Home / Self Care | Attending: Surgery

## 2023-01-23 ENCOUNTER — Ambulatory Visit
Admission: RE | Admit: 2023-01-23 | Discharge: 2023-01-23 | Disposition: A | Discharge status: Home / Self Care | Payer: No Typology Code available for payment source | Attending: Surgery | Admitting: Surgery

## 2023-01-23 ENCOUNTER — Encounter: Payer: Self-pay | Admitting: Surgery

## 2023-01-23 ENCOUNTER — Other Ambulatory Visit: Payer: Self-pay

## 2023-01-23 ENCOUNTER — Ambulatory Visit: Payer: No Typology Code available for payment source | Admitting: Urgent Care

## 2023-01-23 DIAGNOSIS — Z79899 Other long term (current) drug therapy: Secondary | ICD-10-CM | POA: Diagnosis not present

## 2023-01-23 DIAGNOSIS — I10 Essential (primary) hypertension: Secondary | ICD-10-CM | POA: Insufficient documentation

## 2023-01-23 DIAGNOSIS — E118 Type 2 diabetes mellitus with unspecified complications: Secondary | ICD-10-CM

## 2023-01-23 DIAGNOSIS — J45909 Unspecified asthma, uncomplicated: Secondary | ICD-10-CM | POA: Insufficient documentation

## 2023-01-23 DIAGNOSIS — G5602 Carpal tunnel syndrome, left upper limb: Secondary | ICD-10-CM | POA: Diagnosis present

## 2023-01-23 DIAGNOSIS — E119 Type 2 diabetes mellitus without complications: Secondary | ICD-10-CM | POA: Insufficient documentation

## 2023-01-23 DIAGNOSIS — E8801 Alpha-1-antitrypsin deficiency: Secondary | ICD-10-CM | POA: Diagnosis not present

## 2023-01-23 DIAGNOSIS — G4733 Obstructive sleep apnea (adult) (pediatric): Secondary | ICD-10-CM | POA: Diagnosis not present

## 2023-01-23 DIAGNOSIS — Z7985 Long-term (current) use of injectable non-insulin antidiabetic drugs: Secondary | ICD-10-CM | POA: Diagnosis not present

## 2023-01-23 DIAGNOSIS — Z01812 Encounter for preprocedural laboratory examination: Secondary | ICD-10-CM

## 2023-01-23 HISTORY — PX: CARPAL TUNNEL RELEASE: SHX101

## 2023-01-23 LAB — GLUCOSE, CAPILLARY
Glucose-Capillary: 83 mg/dL (ref 70–99)
Glucose-Capillary: 85 mg/dL (ref 70–99)

## 2023-01-23 SURGERY — RELEASE, CARPAL TUNNEL, ENDOSCOPIC
Anesthesia: General | Site: Wrist | Laterality: Left

## 2023-01-23 MED ORDER — CHLORHEXIDINE GLUCONATE 0.12 % MT SOLN
15.0000 mL | Freq: Once | OROMUCOSAL | Status: AC
  Administered 2023-01-23: 15 mL via OROMUCOSAL

## 2023-01-23 MED ORDER — DEXMEDETOMIDINE HCL IN NACL 200 MCG/50ML IV SOLN
INTRAVENOUS | Status: DC | PRN
  Administered 2023-01-23: 8 ug via INTRAVENOUS

## 2023-01-23 MED ORDER — CEFAZOLIN IN SODIUM CHLORIDE 3-0.9 GM/100ML-% IV SOLN
3.0000 g | INTRAVENOUS | Status: AC
  Administered 2023-01-23: 3 g via INTRAVENOUS
  Filled 2023-01-23: qty 100

## 2023-01-23 MED ORDER — KETOROLAC TROMETHAMINE 30 MG/ML IJ SOLN
INTRAMUSCULAR | Status: AC
  Filled 2023-01-23: qty 1

## 2023-01-23 MED ORDER — BUPIVACAINE HCL (PF) 0.5 % IJ SOLN
INTRAMUSCULAR | Status: DC | PRN
  Administered 2023-01-23: 10 mL

## 2023-01-23 MED ORDER — HYDROCODONE-ACETAMINOPHEN 5-325 MG PO TABS: 1.0000 | ORAL_TABLET | Freq: Four times a day (QID) | ORAL | 0 refills | Status: DC | PRN

## 2023-01-23 MED ORDER — FAMOTIDINE 20 MG PO TABS
ORAL_TABLET | ORAL | Status: AC
  Filled 2023-01-23: qty 1

## 2023-01-23 MED ORDER — BUPIVACAINE HCL (PF) 0.5 % IJ SOLN
INTRAMUSCULAR | Status: AC
  Filled 2023-01-23: qty 30

## 2023-01-23 MED ORDER — SEVOFLURANE IN SOLN
RESPIRATORY_TRACT | Status: AC
  Filled 2023-01-23: qty 250

## 2023-01-23 MED ORDER — FENTANYL CITRATE (PF) 100 MCG/2ML IJ SOLN
INTRAMUSCULAR | Status: AC
  Filled 2023-01-23: qty 2

## 2023-01-23 MED ORDER — PROPOFOL 10 MG/ML IV BOLUS
INTRAVENOUS | Status: DC | PRN
  Administered 2023-01-23: 200 mg via INTRAVENOUS

## 2023-01-23 MED ORDER — FENTANYL CITRATE (PF) 100 MCG/2ML IJ SOLN
INTRAMUSCULAR | Status: DC | PRN
  Administered 2023-01-23 (×2): 50 ug via INTRAVENOUS

## 2023-01-23 MED ORDER — CHLORHEXIDINE GLUCONATE 0.12 % MT SOLN
OROMUCOSAL | Status: AC
  Filled 2023-01-23: qty 15

## 2023-01-23 MED ORDER — KETOROLAC TROMETHAMINE 30 MG/ML IJ SOLN
30.0000 mg | Freq: Once | INTRAMUSCULAR | Status: AC
  Administered 2023-01-23: 30 mg via INTRAVENOUS

## 2023-01-23 MED ORDER — HYDROCODONE-ACETAMINOPHEN 5-325 MG PO TABS
1.0000 | ORAL_TABLET | Freq: Four times a day (QID) | ORAL | 0 refills | Status: DC | PRN
  Filled 2023-01-23: qty 12, 2d supply, fill #0

## 2023-01-23 MED ORDER — LIDOCAINE HCL (CARDIAC) PF 100 MG/5ML IV SOSY
PREFILLED_SYRINGE | INTRAVENOUS | Status: DC | PRN
  Administered 2023-01-23: 100 mg via INTRAVENOUS

## 2023-01-23 MED ORDER — MIDAZOLAM HCL 2 MG/2ML IJ SOLN
INTRAMUSCULAR | Status: AC
  Filled 2023-01-23: qty 2

## 2023-01-23 MED ORDER — ACETAMINOPHEN 10 MG/ML IV SOLN
INTRAVENOUS | Status: AC
  Filled 2023-01-23: qty 100

## 2023-01-23 MED ORDER — ACETAMINOPHEN 10 MG/ML IV SOLN
INTRAVENOUS | Status: DC | PRN
  Administered 2023-01-23: 1000 mg via INTRAVENOUS

## 2023-01-23 MED ORDER — SODIUM CHLORIDE 0.9 % IV SOLN: INTRAVENOUS | Status: DC

## 2023-01-23 MED ORDER — ONDANSETRON HCL 4 MG/2ML IJ SOLN
INTRAMUSCULAR | Status: DC | PRN
  Administered 2023-01-23 (×2): 4 mg via INTRAVENOUS

## 2023-01-23 MED ORDER — ORAL CARE MOUTH RINSE: 15.0000 mL | Freq: Once | OROMUCOSAL | Status: AC

## 2023-01-23 MED ORDER — 0.9 % SODIUM CHLORIDE (POUR BTL) OPTIME
TOPICAL | Status: DC | PRN
  Administered 2023-01-23: 250 mL

## 2023-01-23 MED ORDER — GABAPENTIN 100 MG PO CAPS: 100.0000 mg | ORAL_CAPSULE | ORAL | Status: DC | PRN

## 2023-01-23 MED ORDER — MIDAZOLAM HCL 2 MG/2ML IJ SOLN
INTRAMUSCULAR | Status: DC | PRN
  Administered 2023-01-23: 2 mg via INTRAVENOUS

## 2023-01-23 MED ORDER — GLYCOPYRROLATE 0.2 MG/ML IJ SOLN
INTRAMUSCULAR | Status: DC | PRN
  Administered 2023-01-23: .2 mg via INTRAVENOUS

## 2023-01-23 MED ORDER — FAMOTIDINE 20 MG PO TABS
20.0000 mg | ORAL_TABLET | Freq: Once | ORAL | Status: AC
  Administered 2023-01-23: 20 mg via ORAL

## 2023-01-23 SURGICAL SUPPLY — 36 items
APL PRP STRL LF DISP 70% ISPRP (MISCELLANEOUS) ×2
BNDG CMPR 5X2 KNTD ELC UNQ LF (GAUZE/BANDAGES/DRESSINGS) ×1
BNDG CMPR 5X4 CHSV STRCH STRL (GAUZE/BANDAGES/DRESSINGS) ×1
BNDG COHESIVE 4X5 TAN STRL LF (GAUZE/BANDAGES/DRESSINGS) ×1 IMPLANT
BNDG ELASTIC 2INX 5YD STR LF (GAUZE/BANDAGES/DRESSINGS) ×1 IMPLANT
BNDG ESMARCH 4 X 12 STRL LF (GAUZE/BANDAGES/DRESSINGS) ×1
BNDG ESMARCH 4X12 STRL LF (GAUZE/BANDAGES/DRESSINGS) ×1 IMPLANT
CHLORAPREP W/TINT 26 (MISCELLANEOUS) ×1 IMPLANT
CORD BIP STRL DISP 12FT (MISCELLANEOUS) ×1 IMPLANT
CUFF TOURN SGL QUICK 18X4 (TOURNIQUET CUFF) ×1 IMPLANT
DRAPE SURG 17X11 SM STRL (DRAPES) ×1 IMPLANT
FORCEPS JEWEL BIP 4-3/4 STR (INSTRUMENTS) ×1 IMPLANT
GAUZE SPONGE 4X4 12PLY STRL (GAUZE/BANDAGES/DRESSINGS) ×1 IMPLANT
GAUZE XEROFORM 1X8 LF (GAUZE/BANDAGES/DRESSINGS) ×1 IMPLANT
GLOVE BIO SURGEON STRL SZ8 (GLOVE) ×1 IMPLANT
GLOVE INDICATOR 8.0 STRL GRN (GLOVE) ×1 IMPLANT
GOWN STRL REUS W/ TWL LRG LVL3 (GOWN DISPOSABLE) ×1 IMPLANT
GOWN STRL REUS W/ TWL XL LVL3 (GOWN DISPOSABLE) ×1 IMPLANT
GOWN STRL REUS W/TWL LRG LVL3 (GOWN DISPOSABLE) ×1
GOWN STRL REUS W/TWL XL LVL3 (GOWN DISPOSABLE) ×1
KIT CARPAL TUNNEL (MISCELLANEOUS) ×1
KIT ESCP INSRT D SLOT CANN KN (MISCELLANEOUS) ×1 IMPLANT
KIT TURNOVER KIT A (KITS) ×1 IMPLANT
MANIFOLD NEPTUNE II (INSTRUMENTS) ×1 IMPLANT
NS IRRIG 500ML POUR BTL (IV SOLUTION) ×1 IMPLANT
PACK EXTREMITY ARMC (MISCELLANEOUS) ×1 IMPLANT
SPLINT WRIST LG LT TX990309 (SOFTGOODS) IMPLANT
SPLINT WRIST LG RT TX900304 (SOFTGOODS) IMPLANT
SPLINT WRIST M LT TX990308 (SOFTGOODS) IMPLANT
SPLINT WRIST M RT TX990303 (SOFTGOODS) IMPLANT
SPLINT WRIST XL LT TX990310 (SOFTGOODS) IMPLANT
SPLINT WRIST XL RT TX990305 (SOFTGOODS) IMPLANT
STOCKINETTE IMPERVIOUS 9X36 MD (GAUZE/BANDAGES/DRESSINGS) ×1 IMPLANT
SUT PROLENE 4 0 PS 2 18 (SUTURE) ×1 IMPLANT
TRAP FLUID SMOKE EVACUATOR (MISCELLANEOUS) ×1 IMPLANT
WATER STERILE IRR 500ML POUR (IV SOLUTION) ×1 IMPLANT

## 2023-01-23 NOTE — Transfer of Care (Signed)
Immediate Anesthesia Transfer of Care Note  Patient: Adam [name]  Procedure(s) Performed: CARPAL TUNNEL RELEASE ENDOSCOPIC (Left: Wrist)  Patient Location: PACU  Anesthesia Type:General  Level of Consciousness: awake, drowsy, and patient cooperative  Airway & Oxygen Therapy: Patient Spontanous Breathing and Patient connected to face mask oxygen  Post-op Assessment: Report given to RN and Post -op Vital signs reviewed and stable  Post vital signs: Reviewed and stable  Last Vitals:  Vitals Value Taken Time  BP    Temp    Pulse 58 01/23/23 1254  Resp 17 01/23/23 1254  SpO2 100 % 01/23/23 1254  Vitals shown include unvalidated device data.  Last Pain:  Vitals:   01/23/23 1004  TempSrc: Oral  PainSc: 6          Complications: No notable events documented.

## 2023-01-23 NOTE — Discharge Instructions (Addendum)
Orthopedic discharge instructions: Keep dressing dry and intact. Keep hand elevated above heart level. May shower after dressing removed on postop day 4 (Sunday). Cover sutures with Band-Aids after drying off, then reapply Velcro splint. Apply ice to affected area frequently. Take ibuprofen 800 mg TID with meals for 5-7 days, then as necessary. Take ES Tylenol or pain medication as prescribed when needed.  Return for follow-up in 10-14 days or as scheduled.  AMBULATORY SURGERY  DISCHARGE INSTRUCTIONS   The drugs that you were given will stay in your system until tomorrow so for the next 24 hours you should not:  Drive an automobile Make any legal decisions Drink any alcoholic beverage   You may resume regular meals tomorrow.  Today it is better to start with liquids and gradually work up to solid foods.  You may eat anything you prefer, but it is better to start with liquids, then soup and crackers, and gradually work up to solid foods.   Please notify your doctor immediately if you have any unusual bleeding, trouble breathing, redness and pain at the surgery site, drainage, fever, or pain not relieved by medication.    Additional Instructions:        Please contact your physician with any problems or Same Day Surgery at [phone], Monday through Friday 6 am to 4 pm, or [place] at [facility] number at [phone].

## 2023-01-23 NOTE — Op Note (Signed)
01/23/2023  12:55 PM  Patient:   Adam [name]  Pre-Op Diagnosis:   Left carpal tunnel syndrome.  Post-Op Diagnosis:   Same.  Procedure:   Endoscopic left carpal tunnel release.  Surgeon:   Maryagnes Amos, MD  Assistant:   Dutch Quint, PA-S  Anesthesia:   General LMA  Findings:   As above.  Complications:   None  EBL:   0 cc  Fluids:   1000 cc crystalloid  TT:   22 minutes at 250 mmHg  Drains:   None  Closure:   4-0 Prolene interrupted sutures  Brief Clinical Note:   The patient is a 47 year old male with a long history of gradually worsening pain and paresthesias to her his left hand. His symptoms have progressed despite medications, activity modification, etc. His history and examination consistent with carpal tunnel syndrome. The patient presents at this time for an endoscopic left carpal tunnel release.   Procedure:   The patient was brought into the operating room and lain in the supine position. After adequate general laryngeal mask anesthesia was obtained, the left hand and upper extremity were prepped with ChloraPrep solution before being draped sterilely. Preoperative antibiotics were administered. A timeout was performed to verify the appropriate surgical site before the limb was exsanguinated with an Esmarch and the tourniquet inflated to 250 mmHg.   An approximately 1.5-2 cm incision was made over the volar wrist flexion crease, centered over the palmaris longus tendon. The incision was carried down through the subcutaneous tissues with care taken to identify and protect any neurovascular structures. The distal forearm fascia was penetrated just proximal to the transverse carpal ligament. The soft tissues were released off the superficial and deep surfaces of the distal forearm fascia and this was released proximally for 3-4 cm under direct visualization.  Attention was directed distally. The Therapist, nutritional was passed beneath the transverse carpal ligament along  the ulnar aspect of the carpal tunnel and used to release any adhesions as well as to remove any adherent synovial tissue before first the smaller then the larger of the two dilators were passed beneath the transverse carpal ligament along the ulnar margin of the carpal tunnel. The slotted cannula was introduced and the endoscope was placed into the slotted cannula and the undersurface of the transverse carpal ligament visualized. The distal margin of the transverse carpal ligament was marked by placing a 25-gauge needle percutaneously at Kaplan's cardinal point so that it entered the distal portion of the slotted cannula. Under endoscopic visualization, the transverse carpal ligament was released from proximal to distal using the end-cutting blade. A second pass was performed to ensure complete release of the ligament. The adequacy of release was verified both endoscopically and by palpation using the freer elevator.  The wound was irrigated thoroughly with sterile saline solution before being closed using 4-0 Prolene interrupted sutures. A total of 10 cc of 0.5% plain Sensorcaine was injected in and around the incision before a sterile bulky dressing was applied to the wound. The patient was placed into a volar wrist splint before being awakened, extubated, and returned to the recovery room in satisfactory condition after tolerating the procedure well.

## 2023-01-23 NOTE — Anesthesia Preprocedure Evaluation (Addendum)
Anesthesia Evaluation  Patient identified by MRN, date of birth, ID band Patient awake    Reviewed: Allergy & Precautions, H&P , NPO status , Patient's Chart, lab work & pertinent test results, reviewed documented beta blocker date and time   Airway Mallampati: III  TM Distance: >3 FB Neck ROM: full    Dental no notable dental hx.    Pulmonary asthma (childhood) , sleep apnea  Heterozygous alpha 1-antitrypsin deficiency   Pulmonary exam normal        Cardiovascular Exercise Tolerance: Good hypertension, Pt. on home beta blockers and Pt. on medications Normal cardiovascular exam     Neuro/Psych negative neurological ROS  negative psych ROS   GI/Hepatic negative GI ROS, Neg liver ROS,,,  Endo/Other  diabetes, Type 2    Renal/GU      Musculoskeletal   Abdominal  (+) + obese  Peds  Hematology negative hematology ROS (+)   Anesthesia Other Findings Carpal tunnel syndrome on both sides  Past Medical History: No date: Asthma     Comment:  Childhood No date: Chicken pox No date: Chronic headaches No date: Diabetes mellitus without complication (HCC) 2013: Hypertension No date: Neuromuscular disorder (HCC) No date: OSA (obstructive sleep apnea)     Comment:  moderate with AHI 15.5/hr 2014 home sleep study  Past Surgical History: No date: hip dysplagia; Right No date: NO PAST SURGERIES     Reproductive/Obstetrics negative OB ROS                             Anesthesia Physical Anesthesia Plan  ASA: 2  Anesthesia Plan: General LMA   Post-op Pain Management: Ofirmev IV (intra-op)*   Induction:   PONV Risk Score and Plan: 2 and Dexamethasone, Ondansetron, Midazolam and Treatment may vary due to age or medical condition  Airway Management Planned: LMA  Additional Equipment:   Intra-op Plan:   Post-operative Plan:   Informed Consent: I have reviewed the patients History and  Physical, chart, labs and discussed the procedure including the risks, benefits and alternatives for the proposed anesthesia with the patient or authorized representative who has indicated his/her understanding and acceptance.     Dental Advisory Given  Plan Discussed with: Anesthesiologist, CRNA and Surgeon  Anesthesia Plan Comments:         Anesthesia Quick Evaluation

## 2023-01-23 NOTE — Anesthesia Procedure Notes (Signed)
Procedure Name: LMA Insertion Date/Time: 01/23/2023 12:05 PM  Performed by: Mohammed Kindle, CRNAPre-anesthesia Checklist: Patient identified, Emergency Drugs available, Suction available and Patient being monitored Patient Re-evaluated:Patient Re-evaluated prior to induction Oxygen Delivery Method: Circle system utilized Preoxygenation: Pre-oxygenation with 100% oxygen Induction Type: IV induction Ventilation: Two handed mask ventilation required LMA: LMA inserted LMA Size: 5.0 Placement Confirmation: positive ETCO2, CO2 detector and breath sounds checked- equal and bilateral Dental Injury: Teeth and Oropharynx as per pre-operative assessment

## 2023-01-23 NOTE — H&P (Signed)
History of Present Illness:  Adam [name] is a 47 y.o. male who presents for a history and physical for an upcoming left endoscopic carpal tunnel release to be done by Dr. Joice Lofts on Jan 23, 2023. The patient saw Dr. Joice Lofts in the past for an evaluation and treatment of his bilateral hand and wrist pain and paresthesias. He feels that his right hand symptoms are more symptomatic than his left which confuses him as he was told by the neurologist that his EMG showed worse carpal tunnel findings on his left. The patient notes that his symptoms have been present for several years but have worsened over the past several months. His symptoms are aggravated at night, frequently awakening him from sleep. He also notes difficulty when driving, which is quite concerning as he is a Nurse, mental health. He also has noted difficulty picking up small objects with his hands. He has tried over-the-counter medications and splinting. He notes that wearing the splints at night does help with symptoms, but he has to keep the splints so tight that they aggravate his thumbs. He denies any specific injury to either hand, and denies any neck pain. He is right-hand dominant. The patient is a diabetic.  Current Outpatient Medications:  albuterol MDI, PROVENTIL, VENTOLIN, PROAIR, HFA 90 mcg/actuation inhaler Inhale 2 inhalations into the lungs every 4 (four) hours as needed  aspirin 81 MG EC tablet Take 81 mg by mouth once daily  carvediloL (COREG) 6.25 MG tablet Take by mouth  gabapentin (NEURONTIN) 100 MG capsule Take 100 mg by mouth 3 (three) times daily  hydroCHLOROthiazide (HYDRODIURIL) 25 MG tablet Take 1 tablet by mouth once daily  hydrocortisone 2.5 % cream Apply topically  ketoconazole (NIZORAL) 2 % shampoo Apply topically  MOUNJARO 5 mg/0.5 mL PnIj Inject subcutaneously  multivitamin with minerals tablet Take 1 tablet by mouth once daily  telmisartan (MICARDIS) 80 MG tablet Take by mouth  terbinafine HCL (LAMISIL)  250 mg tablet  MOUNJARO 2.5 mg/0.5 mL PnIj (Patient not taking: Reported on 01/17/2023)  predniSONE (DELTASONE) 10 MG tablet (Patient not taking: Reported on 01/17/2023)   Past Medical History:  Asthma, unspecified asthma severity, unspecified whether complicated, unspecified whether persistent (HHS-HCC)  Diabetes mellitus without complication (CMS/HHS-HCC)  Sleep apnea   Past Surgical History: History reviewed. No pertinent surgical history.   Past Family History: No family history on file.   Social History:   Socioeconomic History:  Marital status: Married  Substance and Sexual Activity  Alcohol use: Not Currently  Drug use: Defer  Sexual activity: Defer   Review of Systems:  A comprehensive 14 point ROS was performed, reviewed, and the pertinent orthopaedic findings are documented in the HPI.  Physical Exam: Vitals:  01/17/23 0913  BP: 100/80  Weight: (!) 130.9 kg (288 lb 9.6 oz)  Height: 190.5 cm (6\' 3" )  PainSc: 7  PainLoc: Wrist   General/Constitutional: Pleasant husky middle-age male in no acute distress. Neuro/Psych: Normal mood and affect, oriented to person, place and time. Eyes: Non-icteric. Pupils are equal, round, and reactive to light, and exhibit synchronous movement. ENT: Unremarkable. Lymphatic: No palpable adenopathy. Respiratory: Lungs clear to auscultation, Normal chest excursion, No wheezes, and Non-labored breathing Cardiovascular: Regular rate and rhythm. No murmurs. and No edema, swelling or tenderness, except as noted in detailed exam. Integumentary: No impressive skin lesions present, except as noted in detailed exam. Musculoskeletal: Unremarkable, except as noted in detailed exam.  Heart: Examination of the heart reveals regular, rate, and rhythm. There is  no murmur noted on ascultation. There is a normal apical pulse.  Lungs: Lungs are clear to auscultation. There is no wheeze, rhonchi, or crackles. There is normal expansion of bilateral  chest walls.   Left wrist/hand exam: Skin inspection of the left wrist and hand is unremarkable. No swelling, erythema, ecchymosis, abrasions, or other skin abnormalities are identified. He has no tenderness to palpation over the dorsal or volar aspects of the wrist, nor does he have any tenderness to palpation over the dorsal or palmar aspects of his hand. He exhibits full active and passive range of motion of the wrist without any pain or catching. He is able to actively flex and extend all digits fully without any pain or triggering. He is neurovascularly intact to all digits. He has a negative Phalen's test and a negative Tinel's test.  X-rays/MRI/Lab data:  AP, lateral, and oblique views of the left hand are obtained. These films demonstrate no evidence for fractures, lytic lesions, or significant degenerative changes.  Assessment: Carpal tunnel syndrome, left.   Plan: The treatment options were discussed with the patient and his wife. In addition, patient educational materials were provided regarding the diagnosis and treatment options. The patient is quite frustrated by his symptoms and function limitations, and is ready to consider more aggressive treatment options. Therefore, I have recommended a surgical procedure, specifically an endoscopic left carpal tunnel release. The procedure was discussed with the patient, as were the potential risks (including bleeding, infection, nerve and/or blood vessel injury, persistent or recurrent pain/paresthesias, weakness of grip, need for further surgery, blood clots, strokes, heart attacks and/or arhythmias, pneumonia, etc.) and benefits. The patient states his understanding and wishes to proceed. All of the patient's questions and concerns were answered. He can call any time with further concerns. He will follow up post-surgery, routine.    H&P reviewed and patient re-examined. No changes.

## 2023-01-24 ENCOUNTER — Encounter: Payer: Self-pay | Admitting: Surgery

## 2023-01-24 ENCOUNTER — Other Ambulatory Visit: Payer: Self-pay

## 2023-01-24 NOTE — Anesthesia Postprocedure Evaluation (Signed)
Anesthesia Post Note  Patient: Adam [name]  Procedure(s) Performed: CARPAL TUNNEL RELEASE ENDOSCOPIC (Left: Wrist)  Patient location during evaluation: PACU Anesthesia Type: General Level of consciousness: awake and alert Pain management: pain level controlled Vital Signs Assessment: post-procedure vital signs reviewed and stable Respiratory status: spontaneous breathing, nonlabored ventilation and respiratory function stable Cardiovascular status: blood pressure returned to baseline and stable Postop Assessment: no apparent nausea or vomiting Anesthetic complications: no   No notable events documented.   Last Vitals:  Vitals:   01/23/23 1345 01/23/23 1405  BP:  108/62  Pulse: (!) 55 (!) 52  Resp: 17 16  Temp:  36.4 C  SpO2: 98% 98%    Last Pain:  Vitals:   01/23/23 1405  TempSrc: Oral  PainSc: 0-No pain                 Foye Deer

## 2023-02-19 ENCOUNTER — Other Ambulatory Visit: Payer: Self-pay

## 2023-02-19 ENCOUNTER — Encounter: Payer: Self-pay | Admitting: Gastroenterology

## 2023-02-19 ENCOUNTER — Ambulatory Visit: Payer: No Typology Code available for payment source | Admitting: Anesthesiology

## 2023-02-19 ENCOUNTER — Ambulatory Visit: Payer: No Typology Code available for payment source | Admitting: Dermatology

## 2023-02-19 ENCOUNTER — Encounter
Admission: RE | Disposition: A | Discharge status: Home / Self Care | Payer: Self-pay | Source: Home / Self Care | Attending: Gastroenterology

## 2023-02-19 ENCOUNTER — Ambulatory Visit: Admit: 2023-02-19 | Payer: No Typology Code available for payment source | Admitting: Gastroenterology

## 2023-02-19 ENCOUNTER — Ambulatory Visit
Admission: RE | Admit: 2023-02-19 | Discharge: 2023-02-19 | Disposition: A | Discharge status: Home / Self Care | Payer: No Typology Code available for payment source | Attending: Gastroenterology | Admitting: Gastroenterology

## 2023-02-19 DIAGNOSIS — I10 Essential (primary) hypertension: Secondary | ICD-10-CM | POA: Insufficient documentation

## 2023-02-19 DIAGNOSIS — G4733 Obstructive sleep apnea (adult) (pediatric): Secondary | ICD-10-CM | POA: Insufficient documentation

## 2023-02-19 DIAGNOSIS — E119 Type 2 diabetes mellitus without complications: Secondary | ICD-10-CM | POA: Diagnosis not present

## 2023-02-19 DIAGNOSIS — Z79899 Other long term (current) drug therapy: Secondary | ICD-10-CM | POA: Insufficient documentation

## 2023-02-19 DIAGNOSIS — Z1211 Encounter for screening for malignant neoplasm of colon: Secondary | ICD-10-CM

## 2023-02-19 HISTORY — PX: COLONOSCOPY: SHX5424

## 2023-02-19 LAB — GLUCOSE, CAPILLARY: Glucose-Capillary: 89 mg/dL (ref 70–99)

## 2023-02-19 SURGERY — COLONOSCOPY
Anesthesia: General

## 2023-02-19 SURGERY — COLONOSCOPY WITH PROPOFOL
Anesthesia: General

## 2023-02-19 MED ORDER — SUCCINYLCHOLINE CHLORIDE 200 MG/10ML IV SOSY
PREFILLED_SYRINGE | INTRAVENOUS | Status: DC | PRN
  Administered 2023-02-19: 140 mg via INTRAVENOUS

## 2023-02-19 MED ORDER — ONDANSETRON HCL 4 MG/2ML IJ SOLN
INTRAMUSCULAR | Status: DC | PRN
  Administered 2023-02-19: 4 mg via INTRAVENOUS

## 2023-02-19 MED ORDER — SODIUM CHLORIDE 0.9 % IV SOLN: INTRAVENOUS | Status: DC

## 2023-02-19 MED ORDER — LIDOCAINE HCL (CARDIAC) PF 100 MG/5ML IV SOSY
PREFILLED_SYRINGE | INTRAVENOUS | Status: DC | PRN
  Administered 2023-02-19: 100 mg via INTRAVENOUS

## 2023-02-19 MED ORDER — PROPOFOL 10 MG/ML IV BOLUS
INTRAVENOUS | Status: DC | PRN
  Administered 2023-02-19: 30 mg via INTRAVENOUS
  Administered 2023-02-19: 200 mg via INTRAVENOUS

## 2023-02-19 NOTE — Transfer of Care (Signed)
Immediate Anesthesia Transfer of Care Note  Patient: Adam [name]  Procedure(s) Performed: COLONOSCOPY  Patient Location: PACU and Endoscopy Unit  Anesthesia Type:General  Level of Consciousness: drowsy  Airway & Oxygen Therapy: Patient Spontanous Breathing  Post-op Assessment: Report given to RN and Post -op Vital signs reviewed and stable  Post vital signs: Reviewed and stable  Last Vitals:  Vitals Value Taken Time  BP    Temp    Pulse    Resp    SpO2      Last Pain:  Vitals:   02/19/23 1033  TempSrc:   PainSc: Asleep         Complications: No notable events documented.

## 2023-02-19 NOTE — Anesthesia Preprocedure Evaluation (Signed)
Anesthesia Evaluation  Patient identified by MRN, date of birth, ID band Patient awake    Reviewed: Allergy & Precautions, NPO status , Patient's Chart, lab work & pertinent test results  History of Anesthesia Complications Negative for: history of anesthetic complications  Airway Mallampati: III  TM Distance: >3 FB Neck ROM: full    Dental  (+) Chipped   Pulmonary neg shortness of breath, asthma , sleep apnea    Pulmonary exam normal        Cardiovascular Exercise Tolerance: Good hypertension, (-) angina (-) Past MI Normal cardiovascular exam     Neuro/Psych  Headaches  Neuromuscular disease  negative psych ROS   GI/Hepatic negative GI ROS, Neg liver ROS,neg GERD  ,,  Endo/Other  diabetes, Type 2    Renal/GU      Musculoskeletal   Abdominal   Peds  Hematology negative hematology ROS (+)   Anesthesia Other Findings Past Medical History: No date: Asthma     Comment:  Childhood No date: Chicken pox No date: Chronic headaches No date: Diabetes mellitus without complication (HCC) 2013: Hypertension No date: Neuromuscular disorder (HCC) No date: OSA (obstructive sleep apnea)     Comment:  moderate with AHI 15.5/hr 2014 home sleep study  Past Surgical History: 01/23/2023: CARPAL TUNNEL RELEASE; Left     Comment:  Procedure: CARPAL TUNNEL RELEASE ENDOSCOPIC;  Surgeon:               Christena Flake, MD;  Location: [facility] ORS;  Service:               Orthopedics;  Laterality: Left; No date: hip dysplagia; Right  BMI    Body Mass Index: 35.60 kg/m      Reproductive/Obstetrics negative OB ROS                             Anesthesia Physical Anesthesia Plan  ASA: 3  Anesthesia Plan: General ETT   Post-op Pain Management:    Induction: Intravenous  PONV Risk Score and Plan: Ondansetron, Dexamethasone, Midazolam and Treatment may vary due to age or medical condition  Airway  Management Planned: Oral ETT  Additional Equipment:   Intra-op Plan:   Post-operative Plan: Extubation in OR  Informed Consent: I have reviewed the patients History and Physical, chart, labs and discussed the procedure including the risks, benefits and alternatives for the proposed anesthesia with the patient or authorized representative who has indicated his/her understanding and acceptance.     Dental Advisory Given  Plan Discussed with: Anesthesiologist, CRNA and Surgeon  Anesthesia Plan Comments: (Patient reports active GLP-1 agonist use.  After discussing (with both the patient and the surgeon) the risks of proceeding (aspiration, lung damage and death) and given the option to reschedule the patient declines to reschedule and assents to proceed with rapid sequence intubation for the procedure per ASA guidance.    Patient consented for risks of anesthesia including but not limited to:  - adverse reactions to medications - damage to eyes, teeth, lips or other oral mucosa - nerve damage due to positioning  - sore throat or hoarseness - Damage to heart, brain, nerves, lungs, other parts of body or loss of life  Patient voiced understanding.)       Anesthesia Quick Evaluation

## 2023-02-19 NOTE — H&P (Signed)
Wyline Mood, MD [address], [place], [place], [address] [address], [address], [place], [place], [address] Phone: [phone]  Fax: [phone]  Primary Care Physician:  Sherlene Shams, MD   Pre-Procedure History & Physical: HPI:  Adam [name] is a 47 y.o. male is here for an colonoscopy.   Past Medical History:  Diagnosis Date   Asthma    Childhood   Chicken pox    Chronic headaches    Diabetes mellitus without complication (HCC)    Hypertension 2013   Neuromuscular disorder (HCC)    OSA (obstructive sleep apnea)    moderate with AHI 15.5/hr 2014 home sleep study    Past Surgical History:  Procedure Laterality Date   CARPAL TUNNEL RELEASE Left 01/23/2023   Procedure: CARPAL TUNNEL RELEASE ENDOSCOPIC;  Surgeon: Christena Flake, MD;  Location: [facility] ORS;  Service: Orthopedics;  Laterality: Left;   hip dysplagia Right     Prior to Admission medications   Medication Sig Start Date End Date Taking? Authorizing Provider  carvedilol (COREG) 6.25 MG tablet TAKE 1 TABLET BY MOUTH 2 TIMES DAILY WITH A MEAL. 11/05/22 11/05/23 Yes Sherlene Shams, MD  gabapentin (NEURONTIN) 100 MG capsule Take 1 capsule (100 mg total) by mouth as needed. 01/23/23  Yes Poggi, Excell Seltzer, MD  hydrochlorothiazide (HYDRODIURIL) 25 MG tablet TAKE 1 TABLET BY MOUTH DAILY. 11/05/22 11/05/23 Yes Sherlene Shams, MD  telmisartan (MICARDIS) 80 MG tablet Take 1 tablet (80 mg total) by mouth daily. 11/05/22 11/05/23 Yes Sherlene Shams, MD  acetaminophen (TYLENOL) 500 MG tablet Take 1,000 mg by mouth every 6 (six) hours as needed for mild pain.    [provider]  albuterol (VENTOLIN HFA) 108 (90 Base) MCG/ACT inhaler Inhale 2 puffs into the lungs every 4 (four) hours as needed for wheezing. 11/05/22   Sherlene Shams, MD  aspirin 81 MG tablet Take 81 mg by mouth daily.    [provider]  cholecalciferol (VITAMIN D3) 25 MCG (1000 UNIT) tablet Take 1,000 Units by mouth daily.    [provider]  HYDROcodone-acetaminophen (NORCO/VICODIN) 5-325 MG tablet Take 1-2 tablets by mouth every 6 (six) hours as needed for moderate pain or severe pain. 01/23/23 01/23/24  Poggi, Excell Seltzer, MD  HYDROcodone-acetaminophen (NORCO/VICODIN) 5-325 MG tablet Take 1-2 tablets by mouth every 6 (six) hours as needed for moderate or severe pain. 01/23/23     hydrocortisone 2.5 % cream Apply topically 3 (three) times a week. Apply to scaly patches on face 3 nights a week as needed for flares 09/12/22   Deirdre Evener, MD  ibuprofen (ADVIL) 200 MG tablet Take 200 mg by mouth every 6 (six) hours as needed.    [provider]  ketoconazole (NIZORAL) 2 % shampoo Apply 1 Application topically 3 (three) times a week. Wash face, scalp 3 days a week 09/12/22   Deirdre Evener, MD  Multiple Vitamins-Minerals (MULTIVITAMIN WITH MINERALS) tablet Take 1 tablet by mouth daily.    [provider]  OVER THE COUNTER MEDICATION as needed. Milk Thistle    [provider]  tirzepatide Greggory Keen) 5 MG/0.5ML Pen Inject 5 mg into the skin once a week. 12/20/22   Sherlene Shams, MD  losartan (COZAAR) 100 MG tablet Take 1 tablet (100 mg total) by mouth at bedtime. 10/05/20 10/10/20  Sherlene Shams, MD    Allergies as of 02/19/2023   (No Known Allergies)  Family History  Problem Relation Age of Onset   Arthritis Mother    Hypertension Mother    Hypertension Father    Diabetes Father    Diabetes Brother    Hypertension Brother    Diabetes Maternal Grandmother    Heart disease Maternal Grandmother 79       massive MI   Diabetes Maternal Grandfather    Kidney disease Maternal Grandfather    Heart disease Sister        cardiomyopathy from drug use   Diabetes Sister    Lupus Sister    Asthma Sister     Social History   Socioeconomic History   Marital status: Married    Spouse name: Misty Stanley   Number of children: 3   Years of education: 12   Highest education level: Not on file   Occupational History   Occupation: Pharmacologist: self employed    Comment: Owns [employer]  Tobacco Use   Smoking status: Never   Smokeless tobacco: Never  Building services engineer Use: Never used  Substance and Sexual Activity   Alcohol use: No   Drug use: No   Sexual activity: Yes  Other Topics Concern   Not on file  Social History Narrative   Not on file   Social Determinants of Health   Financial Resource Strain: Not on file  Food Insecurity: Not on file  Transportation Needs: Not on file  Physical Activity: Not on file  Stress: Not on file  Social Connections: Not on file  Intimate Partner Violence: Not on file    Review of Systems: See HPI, otherwise negative ROS  Physical Exam: BP (!) 186/83   Pulse (!) 59   Temp 97.8 F (36.6 C) (Temporal)   Resp 16   Ht 6\' 3"  (1.905 m)   Wt 129.2 kg   SpO2 99%   BMI 35.60 kg/m  General:   Alert,  pleasant and cooperative in NAD Head:  Normocephalic and atraumatic. Neck:  Supple; no masses or thyromegaly. Lungs:  Clear throughout to auscultation, normal respiratory effort.    Heart:  +S1, +S2, Regular rate and rhythm, No edema. Abdomen:  Soft, nontender and nondistended. Normal bowel sounds, without guarding, and without rebound.   Neurologic:  Alert and  oriented x4;  grossly normal neurologically.  Impression/Plan: Adam [name] is here for an colonoscopy to be performed for Screening colonoscopy average risk   Risks, benefits, limitations, and alternatives regarding  colonoscopy have been reviewed with the patient.  Questions have been answered.  All parties agreeable.   Wyline Mood, MD  02/19/2023, 9:53 AM

## 2023-02-19 NOTE — Anesthesia Postprocedure Evaluation (Signed)
Anesthesia Post Note  Patient: Adam [name]  Procedure(s) Performed: COLONOSCOPY  Patient location during evaluation: Endoscopy Anesthesia Type: General Level of consciousness: awake and alert Pain management: pain level controlled Vital Signs Assessment: post-procedure vital signs reviewed and stable Respiratory status: spontaneous breathing, nonlabored ventilation, respiratory function stable and patient connected to nasal cannula oxygen Cardiovascular status: blood pressure returned to baseline and stable Postop Assessment: no apparent nausea or vomiting Anesthetic complications: no   No notable events documented.   Last Vitals:  Vitals:   02/19/23 1053 02/19/23 1103  BP: (!) 140/88 (!) 138/98  Pulse: 77 71  Resp: 16 15  Temp:  (!) 36 C  SpO2: 98% 98%    Last Pain:  Vitals:   02/19/23 1103  TempSrc: Temporal  PainSc: 0-No pain                 Cleda Mccreedy Safaa Stingley

## 2023-02-19 NOTE — Anesthesia Procedure Notes (Signed)
Procedure Name: Intubation Date/Time: 02/19/2023 10:10 AM  Performed by: Cheral Bay, CRNAPre-anesthesia Checklist: Patient identified, Emergency Drugs available, Suction available and Patient being monitored Patient Re-evaluated:Patient Re-evaluated prior to induction Oxygen Delivery Method: Circle system utilized Preoxygenation: Pre-oxygenation with 100% oxygen Induction Type: IV induction and Rapid sequence Ventilation: Mask ventilation without difficulty Laryngoscope Size: McGraph and 4 Grade View: Grade I Tube type: Oral Tube size: 7.0 mm Number of attempts: 1 Airway Equipment and Method: Stylet Placement Confirmation: ETT inserted through vocal cords under direct vision, positive ETCO2 and breath sounds checked- equal and bilateral Secured at: 22 cm Tube secured with: Tape Dental Injury: Teeth and Oropharynx as per pre-operative assessment

## 2023-02-19 NOTE — Op Note (Signed)
[facility] Patient Name: Adam [name] Procedure Date: 02/19/2023 9:52 AM MRN: [id number] Account #: [id number] Date of Birth: Oct 17, 1975 Admit Type: Outpatient Age: 47 Room: [facility] ENDO ROOM 2 Gender: Male Note Status: Finalized Instrument Name: Prentice Docker [id number] Procedure:             Colonoscopy Indications:           Screening for colorectal malignant neoplasm Providers:             Wyline Mood MD, MD Referring MD:          Duncan Dull, MD (Referring MD) Medicines:             Monitored Anesthesia Care Complications:         No immediate complications. Procedure:             Pre-Anesthesia Assessment:                        - Prior to the procedure, a History and Physical was                         performed, and patient medications, allergies and                         sensitivities were reviewed. The patient's tolerance                         of previous anesthesia was reviewed.                        - The risks and benefits of the procedure and the                         sedation options and risks were discussed with the                         patient. All questions were answered and informed                         consent was obtained.                        - ASA Grade Assessment: II - A patient with mild                         systemic disease.                        After obtaining informed consent, the colonoscope was                         passed under direct vision. Throughout the procedure,                         the patient's blood pressure, pulse, and oxygen                         saturations were monitored continuously. The                         Colonoscope was introduced  through the anus and                         advanced to the the cecum, identified by the                         appendiceal orifice. The colonoscopy was performed                         with ease. The patient tolerated the procedure well.                          The quality of the bowel preparation was good. The                         ileocecal valve, appendiceal orifice, and rectum were                         photographed. Findings:      The perianal and digital rectal examinations were normal.      The entire examined colon appeared normal on direct and retroflexion       views. Impression:            - The entire examined colon is normal on direct and                         retroflexion views.                        - No specimens collected. Recommendation:        - Discharge patient to home (with escort).                        - Resume previous diet.                        - Continue present medications.                        - Repeat colonoscopy in 10 years for screening                         purposes. Procedure Code(s):     --- Professional ---                        [phone], Colonoscopy, flexible; diagnostic, including                         collection of specimen(s) by brushing or washing, when                         performed (separate procedure) Diagnosis Code(s):     --- Professional ---                        Z12.11, Encounter for screening for malignant neoplasm                         of colon CPT copyright 2022 American Medical Association. All rights reserved. The codes documented in this report  are preliminary and upon coder review may  be revised to meet current compliance requirements. Wyline Mood, MD Wyline Mood MD, MD 02/19/2023 10:26:47 AM This report has been signed electronically. Number of Addenda: 0 Note Initiated On: 02/19/2023 9:52 AM Scope Withdrawal Time: 0 hours 9 minutes 9 seconds  Total Procedure Duration: 0 hours 12 minutes 58 seconds  Estimated Blood Loss:  Estimated blood loss: none.      [facility]

## 2023-02-20 ENCOUNTER — Encounter: Payer: Self-pay | Admitting: Gastroenterology

## 2023-02-22 ENCOUNTER — Other Ambulatory Visit: Payer: Self-pay

## 2023-02-25 ENCOUNTER — Other Ambulatory Visit: Payer: Self-pay

## 2023-03-15 ENCOUNTER — Ambulatory Visit (INDEPENDENT_AMBULATORY_CARE_PROVIDER_SITE_OTHER): Payer: No Typology Code available for payment source | Admitting: Podiatry

## 2023-03-15 DIAGNOSIS — L600 Ingrowing nail: Secondary | ICD-10-CM

## 2023-03-15 NOTE — Progress Notes (Signed)
  Subjective:  Patient ID: Adam [name], male    DOB: 02-Nov-1975,  MRN: [id number]  Chief Complaint  Patient presents with   Nail Problem     Right great toe possible ingrown patient states there has been discharge and swelling.     47 y.o. male presents with concern for pain to the right great nail lateral border.  He has pain on palpation and pressure on the area.  Has been some drainage and swelling around the area.  Past Medical History:  Diagnosis Date   Asthma    Childhood   Chicken pox    Chronic headaches    Diabetes mellitus without complication (HCC)    Hypertension 2013   Neuromuscular disorder (HCC)    OSA (obstructive sleep apnea)    moderate with AHI 15.5/hr 2014 home sleep study    No Known Allergies  ROS: Negative except as per HPI above  Objective:  General: AAO x3, NAD  Dermatological: Incurvation is present along the lateral nail border of the right great toe. There is localized edema without any erythema or increase in warmth around the nail border. There is no drainage or pus. There is no ascending cellulitis. No malodor. No open lesions or pre-ulcerative lesions.   Vascular:  Dorsalis Pedis artery and Posterior Tibial artery pedal pulses are 2/4 bilateral.  Capillary fill time < 3 sec to all digits.   Neruologic: Grossly intact via light touch bilateral. Protective threshold intact to all sites bilateral.   Musculoskeletal: No gross boney pedal deformities bilateral. No pain, crepitus, or limitation noted with foot and ankle range of motion bilateral. Muscular strength 5/5 in all groups tested bilateral.  Gait: Unassisted, Nonantalgic.   No images are attached to the encounter.  Assessment:   1. Ingrown nail of great toe of right foot      Plan:  Patient was evaluated and treated and all questions answered.  #Ingrown Nail, right -Patient elects to proceed with minor surgery to remove ingrown toenail today. Consent reviewed and signed by  patient. -Ingrown nail excised. See procedure note. -Educated on post-procedure care including soaking. Written instructions provided and reviewed. -Patient to follow up in 2 weeks for nail check.  Procedure: Excision of Ingrown Toenail Location: Right 1st toe lateral nail borders. Anesthesia: Lidocaine 1% plain; 1.5 mL and Marcaine 0.5% plain; 1.5 mL, digital block. Skin Prep: Betadine. Dressing: Silvadene; telfa; dry, sterile, compression dressing. Technique: Following skin prep, the toe was exsanguinated and a tourniquet was secured at the base of the toe. The affected nail border was freed, split with a nail splitter, and excised. Chemical matrixectomy was then performed with phenol and irrigated out with alcohol. The tourniquet was then removed and sterile dressing applied. Disposition: Patient tolerated procedure well. Patient to return in 2 weeks for follow-up.    Return in about 2 weeks (around 03/29/2023) for nail check.          Corinna Gab, DPM [facility]

## 2023-03-15 NOTE — Patient Instructions (Signed)

## 2023-03-31 ENCOUNTER — Other Ambulatory Visit: Payer: Self-pay | Admitting: Internal Medicine

## 2023-04-01 ENCOUNTER — Other Ambulatory Visit: Payer: Self-pay

## 2023-04-01 MED ORDER — MOUNJARO 5 MG/0.5ML ~~LOC~~ SOAJ
5.0000 mg | SUBCUTANEOUS | 2 refills | Status: DC
  Filled 2023-04-01: qty 2, 28d supply, fill #0
  Filled 2023-05-03: qty 2, 28d supply, fill #1
  Filled 2023-05-27: qty 2, 28d supply, fill #2

## 2023-04-04 ENCOUNTER — Ambulatory Visit (INDEPENDENT_AMBULATORY_CARE_PROVIDER_SITE_OTHER): Payer: No Typology Code available for payment source | Admitting: Podiatry

## 2023-04-04 DIAGNOSIS — Z91199 Patient's noncompliance with other medical treatment and regimen due to unspecified reason: Secondary | ICD-10-CM

## 2023-04-04 NOTE — Progress Notes (Signed)
Pt was a no show for apt 

## 2023-05-03 ENCOUNTER — Other Ambulatory Visit: Payer: Self-pay

## 2023-05-08 ENCOUNTER — Encounter: Payer: Self-pay | Admitting: Podiatry

## 2023-05-08 ENCOUNTER — Ambulatory Visit (INDEPENDENT_AMBULATORY_CARE_PROVIDER_SITE_OTHER): Payer: No Typology Code available for payment source | Admitting: Podiatry

## 2023-05-08 DIAGNOSIS — B351 Tinea unguium: Secondary | ICD-10-CM

## 2023-05-08 NOTE — Progress Notes (Signed)
  Subjective:  Patient ID: Adam [name], male    DOB: September 06, 1976,  MRN: [id number]  Chief Complaint  Patient presents with   Nail Problem    "They're good."    47 y.o. male presents with the above complaint. History confirmed with patient.  He notes it is doing better  Objective:  Physical Exam: warm, good capillary refill, no trophic changes or ulcerative lesions, normal DP and PT pulses, normal sensory exam, and 75% proximal clearing noted improved since last exam       ALT on 12/31/2022 elevated to 77 up from 58   Assessment:   1. Onychomycosis      Plan:  Patient was evaluated and treated and all questions answered.  Onychomycosis -Doing much better and continues to clear approximately.  Has had no issues that appear to be consistent with recurrence, I do not expect that we will need further Lamisil at this point.  He will notify me if he has any further evidence of recurrence or regrowth or new issues.  Follow-up as needed. No follow-ups on file.

## 2023-06-11 ENCOUNTER — Other Ambulatory Visit: Payer: Self-pay | Admitting: Internal Medicine

## 2023-06-12 ENCOUNTER — Other Ambulatory Visit: Payer: Self-pay

## 2023-06-12 MED ORDER — TELMISARTAN 80 MG PO TABS
80.0000 mg | ORAL_TABLET | Freq: Every day | ORAL | 1 refills | Status: DC
  Filled 2023-06-12: qty 30, 30d supply, fill #0
  Filled 2023-07-30: qty 30, 30d supply, fill #1
  Filled 2023-08-29: qty 30, 30d supply, fill #2
  Filled 2023-09-10: qty 90, 90d supply, fill #3
  Filled 2023-09-27: qty 30, 30d supply, fill #3
  Filled 2023-10-30: qty 30, 30d supply, fill #4
  Filled 2023-11-28: qty 30, 30d supply, fill #5

## 2023-06-23 ENCOUNTER — Other Ambulatory Visit: Payer: Self-pay

## 2023-06-23 ENCOUNTER — Other Ambulatory Visit: Payer: Self-pay | Admitting: Internal Medicine

## 2023-06-24 ENCOUNTER — Other Ambulatory Visit: Payer: Self-pay

## 2023-06-24 MED FILL — Tirzepatide Soln Auto-injector 5 MG/0.5ML: SUBCUTANEOUS | 28 days supply | Qty: 2 | Fill #0 | Status: AC

## 2023-06-25 ENCOUNTER — Encounter (HOSPITAL_BASED_OUTPATIENT_CLINIC_OR_DEPARTMENT_OTHER): Payer: Self-pay | Admitting: Cardiology

## 2023-07-18 ENCOUNTER — Other Ambulatory Visit: Payer: Self-pay

## 2023-07-18 MED ORDER — PENICILLIN V POTASSIUM 500 MG PO TABS
500.0000 mg | ORAL_TABLET | Freq: Four times a day (QID) | ORAL | 0 refills | Status: DC
  Filled 2023-07-18: qty 28, 7d supply, fill #0

## 2023-07-30 ENCOUNTER — Other Ambulatory Visit: Payer: Self-pay

## 2023-07-30 ENCOUNTER — Ambulatory Visit: Payer: No Typology Code available for payment source | Admitting: Internal Medicine

## 2023-07-30 MED FILL — Tirzepatide Soln Auto-injector 5 MG/0.5ML: SUBCUTANEOUS | 28 days supply | Qty: 2 | Fill #1 | Status: AC

## 2023-08-28 ENCOUNTER — Other Ambulatory Visit: Payer: Self-pay

## 2023-08-28 MED ORDER — DOXYCYCLINE HYCLATE 100 MG PO CAPS
100.0000 mg | ORAL_CAPSULE | Freq: Two times a day (BID) | ORAL | 0 refills | Status: DC
  Filled 2023-08-28: qty 14, 7d supply, fill #0

## 2023-08-28 MED FILL — Tirzepatide Soln Auto-injector 5 MG/0.5ML: SUBCUTANEOUS | 28 days supply | Qty: 2 | Fill #2 | Status: AC

## 2023-09-10 ENCOUNTER — Other Ambulatory Visit: Payer: Self-pay

## 2023-09-10 ENCOUNTER — Ambulatory Visit: Payer: No Typology Code available for payment source | Admitting: Internal Medicine

## 2023-09-10 ENCOUNTER — Encounter: Payer: Self-pay | Admitting: Internal Medicine

## 2023-09-10 VITALS — BP 120/86 | HR 78 | Ht 75.0 in | Wt 281.6 lb

## 2023-09-10 DIAGNOSIS — E118 Type 2 diabetes mellitus with unspecified complications: Secondary | ICD-10-CM

## 2023-09-10 DIAGNOSIS — Z7985 Long-term (current) use of injectable non-insulin antidiabetic drugs: Secondary | ICD-10-CM

## 2023-09-10 DIAGNOSIS — E785 Hyperlipidemia, unspecified: Secondary | ICD-10-CM

## 2023-09-10 DIAGNOSIS — S91339A Puncture wound without foreign body, unspecified foot, initial encounter: Secondary | ICD-10-CM

## 2023-09-10 DIAGNOSIS — I10 Essential (primary) hypertension: Secondary | ICD-10-CM | POA: Diagnosis not present

## 2023-09-10 DIAGNOSIS — Z23 Encounter for immunization: Secondary | ICD-10-CM

## 2023-09-10 DIAGNOSIS — K7581 Nonalcoholic steatohepatitis (NASH): Secondary | ICD-10-CM

## 2023-09-10 DIAGNOSIS — S91331D Puncture wound without foreign body, right foot, subsequent encounter: Secondary | ICD-10-CM

## 2023-09-10 HISTORY — DX: Puncture wound without foreign body, unspecified foot, initial encounter: S91.339A

## 2023-09-10 LAB — COMPREHENSIVE METABOLIC PANEL
ALT: 75 U/L — ABNORMAL HIGH (ref 0–53)
AST: 33 U/L (ref 0–37)
Albumin: 4.3 g/dL (ref 3.5–5.2)
Alkaline Phosphatase: 67 U/L (ref 39–117)
BUN: 16 mg/dL (ref 6–23)
CO2: 33 meq/L — ABNORMAL HIGH (ref 19–32)
Calcium: 9.6 mg/dL (ref 8.4–10.5)
Chloride: 99 meq/L (ref 96–112)
Creatinine, Ser: 1.19 mg/dL (ref 0.40–1.50)
GFR: 72.61 mL/min (ref >60.00)
Glucose, Bld: 84 mg/dL (ref 70–99)
Potassium: 3.7 meq/L (ref 3.5–5.1)
Sodium: 137 meq/L (ref 135–145)
Total Bilirubin: 1.4 mg/dL — ABNORMAL HIGH (ref 0.2–1.2)
Total Protein: 7.5 g/dL (ref 6.0–8.3)

## 2023-09-10 LAB — LIPID PANEL
Cholesterol: 144 mg/dL (ref 0–200)
HDL: 50.6 mg/dL (ref >39.00)
LDL Cholesterol: 68 mg/dL (ref 0–99)
NonHDL: 93.08
Total CHOL/HDL Ratio: 3
Triglycerides: 125 mg/dL (ref 0.0–149.0)
VLDL: 25 mg/dL (ref 0.0–40.0)

## 2023-09-10 LAB — MICROALBUMIN / CREATININE URINE RATIO
Creatinine,U: 208.2 mg/dL
Microalb Creat Ratio: 1 mg/g (ref 0.0–30.0)
Microalb, Ur: 2 mg/dL — ABNORMAL HIGH (ref 0.0–1.9)

## 2023-09-10 LAB — LDL CHOLESTEROL, DIRECT: Direct LDL: 83 mg/dL

## 2023-09-10 LAB — HEMOGLOBIN A1C: Hgb A1c MFr Bld: 5.6 % (ref 4.6–6.5)

## 2023-09-10 MED ORDER — TIRZEPATIDE 7.5 MG/0.5ML ~~LOC~~ SOAJ
7.5000 mg | SUBCUTANEOUS | 0 refills | Status: DC
  Filled 2023-09-10 – 2023-09-27 (×2): qty 2, 28d supply, fill #0
  Filled 2023-10-24: qty 2, 28d supply, fill #1
  Filled 2023-11-20: qty 2, 28d supply, fill #2

## 2023-09-10 MED ORDER — AIRSUPRA 90-80 MCG/ACT IN AERO
1.0000 | INHALATION_SPRAY | Freq: Four times a day (QID) | RESPIRATORY_TRACT | 11 refills | Status: DC | PRN
  Filled 2023-09-10 – 2023-09-27 (×2): qty 10.7, 30d supply, fill #0

## 2023-09-10 MED ORDER — CARVEDILOL 6.25 MG PO TABS
6.2500 mg | ORAL_TABLET | Freq: Two times a day (BID) | ORAL | 2 refills | Status: DC
  Filled 2023-09-10: qty 180, 90d supply, fill #0
  Filled 2023-09-10: qty 180, fill #0
  Filled 2023-09-27: qty 60, 30d supply, fill #0
  Filled 2023-10-30: qty 60, 30d supply, fill #1
  Filled 2023-11-28: qty 60, 30d supply, fill #2
  Filled 2024-01-01: qty 60, 30d supply, fill #3
  Filled 2024-02-04: qty 60, 30d supply, fill #4
  Filled 2024-03-09: qty 60, 30d supply, fill #5
  Filled 2024-04-06: qty 60, 30d supply, fill #6
  Filled 2024-05-08: qty 60, 30d supply, fill #7
  Filled 2024-06-03: qty 60, 30d supply, fill #8

## 2023-09-10 NOTE — Assessment & Plan Note (Signed)
No evidence of abscess or cellulitis.  TD received , given by by urgent care

## 2023-09-10 NOTE — Assessment & Plan Note (Signed)
Using [employer] .  Dose increased to 7.5 mg

## 2023-09-10 NOTE — Patient Instructions (Signed)
1) You received the Prevnar 20 vaccine today to protect you against pneumonia .  This is STANDARD OF CARE FOR ALL PATIENTS WITH DIABETES   2) Your recent nail puncture wound  was a 'CLOSE CALL" SINCE YOU WERE BEHIND ON your tetanus vaccine  3) I have increased your mounjaro dose with your next refill to 7.5 mg   4) The goal for optimal blood pressure management is to have most of your readings fall between  120/70 and 130/80 ( prevent kidney failure,  strokes  etc) .  Please check your blood pressure a few times at home and send me the readings so I can determine if you need a change in medication    5) AirSupra is preferred over Albuterol for rescue inhaled therapy so I have sent it to pharmacy  6) Your insurance only allows 30 day refills, not 90!   7) May the Lord give you peace and joy during this holiday season, and may the promise of His return bring you comfort and hope for the future.  Regards,   Duncan Dull, MD

## 2023-09-10 NOTE — Assessment & Plan Note (Signed)
He is an alpha 1 antitrypsin mutation carrier,  based on previously reported labs  and GI evaluation by Dr Servando Snare.  No biopsies were  done. He was referred to a nutritionist,  but thus far only GLP 1 agonists have been successful in achieving and maintaining weight loss.

## 2023-09-10 NOTE — Progress Notes (Signed)
Subjective:  Patient ID: Adam [name], male    DOB: 14-Jul-1976  Age: 47 y.o. MRN: [id number]  CC: The primary encounter diagnosis was Primary hypertension. Diagnoses of Controlled type 2 diabetes mellitus with complication, without long-term current use of insulin (HCC), Hyperlipidemia, unspecified hyperlipidemia type, Need for pneumococcal 20-valent conjugate vaccination, Metabolic dysfunction-associated steatohepatitis (MASH), Long-term current use of injectable noninsulin antidiabetic medication, and Puncture wound of right foot, subsequent encounter were also pertinent to this visit.   HPI Adam [name] presents for  Chief Complaint  Patient presents with   Medical Management of Chronic Issues   1) Type 2 DM obesity hypertension:  using mounjaro 5 mg weekly and losing weight .  No longer travelling to [facility].  Weight loss stalled during Thanksgiving festivities .not checking BS or BP    2) Right foot was punctured on the plantar surface by  a nail  at work (was wearing crocks) that he encounttered outside in a parking lot.  Had to pull the nail out.  Went to urgent [facility]  wound was cleaned x rys and TD given     Outpatient Medications Prior to Visit  Medication Sig Dispense Refill   acetaminophen (TYLENOL) 500 MG tablet Take 1,000 mg by mouth every 6 (six) hours as needed for mild pain.     albuterol (VENTOLIN HFA) 108 (90 Base) MCG/ACT inhaler Inhale 2 puffs into the lungs every 4 (four) hours as needed for wheezing. 6.7 g 2   aspirin 81 MG tablet Take 81 mg by mouth daily.     cholecalciferol (VITAMIN D3) 25 MCG (1000 UNIT) tablet Take 1,000 Units by mouth daily.     gabapentin (NEURONTIN) 100 MG capsule Take 1 capsule (100 mg total) by mouth as needed.     hydrochlorothiazide (HYDRODIURIL) 25 MG tablet TAKE 1 TABLET BY MOUTH DAILY. 90 tablet 3   hydrocortisone 2.5 % cream Apply topically 3 (three) times a week. Apply to scaly patches on face 3 nights a week as needed for flares 60 g 3    ibuprofen (ADVIL) 200 MG tablet Take 200 mg by mouth every 6 (six) hours as needed.     ketoconazole (NIZORAL) 2 % shampoo Apply 1 Application topically 3 (three) times a week. Wash face, scalp 3 days a week 120 mL 3   Multiple Vitamins-Minerals (MULTIVITAMIN WITH MINERALS) tablet Take 1 tablet by mouth daily.     OVER THE COUNTER MEDICATION as needed. Milk Thistle     penicillin v potassium (VEETID) 500 MG tablet Take 2 tablets (500 mg total) by mouth now. Then take 1 tablet by mouth every 6 (six) hours until gone. 28 tablet 0   telmisartan (MICARDIS) 80 MG tablet Take 1 tablet (80 mg total) by mouth daily. 90 tablet 1   carvedilol (COREG) 6.25 MG tablet TAKE 1 TABLET BY MOUTH 2 TIMES DAILY WITH A MEAL. 180 tablet 2   tirzepatide (MOUNJARO) 5 MG/0.5ML Pen Inject 5 mg into the skin once a week. 2 mL 2   doxycycline (VIBRAMYCIN) 100 MG capsule Take 1 capsule (100 mg total) by mouth 2 (two) times daily for 7 days. (Patient not taking: Reported on 09/10/2023) 14 capsule 0   HYDROcodone-acetaminophen (NORCO/VICODIN) 5-325 MG tablet Take 1-2 tablets by mouth every 6 (six) hours as needed for moderate pain or severe pain. (Patient not taking: Reported on 09/10/2023) 12 tablet 0   HYDROcodone-acetaminophen (NORCO/VICODIN) 5-325 MG tablet Take 1-2 tablets by mouth every 6 (six) hours as needed  for moderate or severe pain. (Patient not taking: Reported on 09/10/2023) 12 tablet 0   No facility-administered medications prior to visit.    Review of Systems;  Patient denies headache, fevers, malaise, unintentional weight loss, skin rash, eye pain, sinus congestion and sinus pain, sore throat, dysphagia,  hemoptysis , cough, dyspnea, wheezing, chest pain, palpitations, orthopnea, edema, abdominal pain, nausea, melena, diarrhea, constipation, flank pain, dysuria, hematuria, urinary  Frequency, nocturia, numbness, tingling, seizures,  Focal weakness, Loss of consciousness,  Tremor, insomnia, depression, anxiety,  and suicidal ideation.      Objective:  BP 120/86   Pulse 78   Ht 6\' 3"  (1.905 m)   Wt 281 lb 9.6 oz (127.7 kg)   SpO2 98%   BMI 35.20 kg/m   BP Readings from Last 3 Encounters:  09/10/23 120/86  02/19/23 (!) 138/98  01/23/23 108/62    Wt Readings from Last 3 Encounters:  09/10/23 281 lb 9.6 oz (127.7 kg)  02/19/23 284 lb 12.8 oz (129.2 kg)  01/23/23 288 lb (130.6 kg)    Physical Exam Vitals reviewed.  Constitutional:      General: He is not in acute distress.    Appearance: Normal appearance. He is normal weight. He is not ill-appearing, toxic-appearing or diaphoretic.  HENT:     Head: Normocephalic.  Eyes:     General: No scleral icterus.       Right eye: No discharge.        Left eye: No discharge.     Conjunctiva/sclera: Conjunctivae normal.  Cardiovascular:     Rate and Rhythm: Normal rate and regular rhythm.     Heart sounds: Normal heart sounds.  Pulmonary:     Effort: Pulmonary effort is normal. No respiratory distress.     Breath sounds: Normal breath sounds.  Musculoskeletal:        General: Normal range of motion.     Cervical back: Normal range of motion.  Skin:    General: Skin is warm and dry.  Neurological:     General: No focal deficit present.     Mental Status: He is alert and oriented to person, place, and time. Mental status is at baseline.  Psychiatric:        Mood and Affect: Mood normal.        Behavior: Behavior normal.        Thought Content: Thought content normal.        Judgment: Judgment normal.    Lab Results  Component Value Date   HGBA1C 5.6 09/10/2023   HGBA1C 5.7 12/31/2022   HGBA1C 5.6 10/01/2022    Lab Results  Component Value Date   CREATININE 1.19 09/10/2023   CREATININE 1.21 12/31/2022   CREATININE 1.22 10/01/2022    Lab Results  Component Value Date   WBC 5.6 01/17/2023   HGB 17.8 (H) 01/17/2023   HCT 53.9 (H) 01/17/2023   PLT 250 01/17/2023   GLUCOSE 84 09/10/2023   CHOL 144 09/10/2023   TRIG  125.0 09/10/2023   HDL 50.60 09/10/2023   LDLDIRECT 83.0 09/10/2023   LDLCALC 68 09/10/2023   ALT 75 (H) 09/10/2023   AST 33 09/10/2023   NA 137 09/10/2023   K 3.7 09/10/2023   CL 99 09/10/2023   CREATININE 1.19 09/10/2023   BUN 16 09/10/2023   CO2 33 (H) 09/10/2023   TSH 0.95 10/01/2022   PSA 0.77 10/02/2022   HGBA1C 5.6 09/10/2023   MICROALBUR 2.0 (H) 09/10/2023    No results  found.  Assessment & Plan:  .Primary hypertension Assessment & Plan: Goal is 120/70.  taking  telmisartan 80 mg daily . Continue hctz and carvedilol  Orders: -     Comprehensive metabolic panel -     Microalbumin / creatinine urine ratio  Controlled type 2 diabetes mellitus with complication, without long-term current use of insulin (HCC) Assessment & Plan: Diagnosed with A1c of 6.4, fasting glucose of 124.  Complicated by hypertension fatty liver, obesity and OSA.   Improved control and weight with use of  Mounjaro after failing a trial of metformin alone. Advised to increased dose to 7.5 mg weekly   Lab Results  Component Value Date   HGBA1C 5.6 09/10/2023     Orders: -     Comprehensive metabolic panel -     Hemoglobin A1c -     Microalbumin / creatinine urine ratio  Hyperlipidemia, unspecified hyperlipidemia type -     Lipid panel -     LDL cholesterol, direct  Need for pneumococcal 20-valent conjugate vaccination -     Pneumococcal conjugate vaccine 20-valent  Metabolic dysfunction-associated steatohepatitis (MASH) Assessment & Plan: He is an alpha 1 antitrypsin mutation carrier,  based on previously reported labs  and GI evaluation by Dr Servando Snare.  No biopsies were  done. He was referred to a nutritionist,  but thus far only GLP 1 agonists have been successful in achieving and maintaining weight loss.    Long-term current use of injectable noninsulin antidiabetic medication Assessment & Plan: Using Mounjaro .  Dose increased to 7.5 mg    Puncture wound of right foot, subsequent  encounter Assessment & Plan: No evidence of abscess or cellulitis.  TD received , given by by urgent care    Other orders -     Carvedilol; Take 1 tablet (6.25 mg total) by mouth 2 (two) times daily with a meal.  Dispense: 180 tablet; Refill: 2 -     Tirzepatide; Inject 7.5 mg into the skin once a week.  Dispense: 6 mL; Refill: 0 -     Airsupra; Inhale 1 puff into the lungs every 6 (six) hours as needed.  Dispense: 10.7 g; Refill: 11     I provided 30 minutes of face-to-face time during this encounter reviewing patient's last visit with me, recent surgical and non surgical procedures, previous  labs and imaging studies, counseling on currently addressed issues,  and post visit ordering to diagnostics and therapeutics .   Follow-up: Return in about 6 months (around 03/10/2024) for follow up diabetes, physical.   Sherlene Shams, MD

## 2023-09-10 NOTE — Assessment & Plan Note (Signed)
Goal is 120/70.  taking  telmisartan 80 mg daily . Continue hctz and carvedilol

## 2023-09-10 NOTE — Assessment & Plan Note (Signed)
Diagnosed with A1c of 6.4, fasting glucose of 124.  Complicated by hypertension fatty liver, obesity and OSA.   Improved control and weight with use of  Mounjaro after failing a trial of metformin alone. Advised to increased dose to 7.5 mg weekly   Lab Results  Component Value Date   HGBA1C 5.6 09/10/2023

## 2023-09-23 ENCOUNTER — Ambulatory Visit: Payer: No Typology Code available for payment source | Admitting: Internal Medicine

## 2023-09-24 ENCOUNTER — Other Ambulatory Visit: Payer: Self-pay

## 2023-09-27 ENCOUNTER — Other Ambulatory Visit: Payer: Self-pay

## 2023-09-30 ENCOUNTER — Other Ambulatory Visit (HOSPITAL_COMMUNITY): Payer: Self-pay

## 2023-10-09 ENCOUNTER — Ambulatory Visit: Payer: No Typology Code available for payment source | Attending: Cardiology | Admitting: Cardiology

## 2023-10-09 ENCOUNTER — Encounter: Payer: Self-pay | Admitting: Cardiology

## 2023-10-09 VITALS — BP 118/60 | HR 71 | Ht 75.0 in | Wt 279.8 lb

## 2023-10-09 DIAGNOSIS — G4733 Obstructive sleep apnea (adult) (pediatric): Secondary | ICD-10-CM

## 2023-10-09 DIAGNOSIS — I10 Essential (primary) hypertension: Secondary | ICD-10-CM | POA: Diagnosis not present

## 2023-10-09 NOTE — Patient Instructions (Signed)
 Medication Instructions:  Your physician recommends that you continue on your current medications as directed. Please refer to the Current Medication list given to you today.  *If you need a refill on your cardiac medications before your next appointment, please call your pharmacy*   Lab Work: None.  If you have labs (blood work) drawn today and your tests are completely normal, you will receive your results only by: MyChart Message (if you have MyChart) OR A paper copy in the mail If you have any lab test that is abnormal or we need to change your treatment, we will call you to review the results.   Testing/Procedures: None.   Follow-Up:  Your next appointment will be dependent upon the delivery of your cpap equipment and it will be with:     Provider:   Dr. Gaylyn Keas, MD

## 2023-10-09 NOTE — Progress Notes (Signed)
 Evaluation Performed:  Follow-up visit  Date:  10/09/2023   ID:  Adam [name], DOB 1976/08/29, MRN [id number]  [id number] Sleep Medicine:  Gaylyn Keas, MD Electrophysiologist:  None   Chief Complaint:  OSA  History of Present Illness:    Adam [name] is a 48 y.o. male with a hx of presyncope, OSA and labile HTN. He was supposed to get a new CPAP but never got it last year.   He tolerates the nasal pillow mask and feels the pressure is adequate.  Since going on PAP he feels rested in the am and has no significant daytime sleepiness.  He denies any significant mouth or nasal dryness or nasal congestion.  He does not think that he snores.    Prior CV studies:   The following studies were reviewed today:  PAP compliance download  Past Medical History:  Diagnosis Date   Asthma    Childhood   Chicken pox    Chronic headaches    Diabetes mellitus without complication (HCC)    Hypertension 2013   Neuromuscular disorder (HCC)    OSA (obstructive sleep apnea)    moderate with AHI 15.5/hr 2014 home sleep study   Past Surgical History:  Procedure Laterality Date   CARPAL TUNNEL RELEASE Left 01/23/2023   Procedure: CARPAL TUNNEL RELEASE ENDOSCOPIC;  Surgeon: Elner Hahn, MD;  Location: [facility] ORS;  Service: Orthopedics;  Laterality: Left;   COLONOSCOPY N/A 02/19/2023   Procedure: COLONOSCOPY;  Surgeon: Luke Salaam, MD;  Location: [facility] ENDOSCOPY;  Service: Gastroenterology;  Laterality: N/A;   hip dysplagia Right      Current Meds  Medication Sig   acetaminophen  (TYLENOL ) 500 MG tablet Take 1,000 mg by mouth every 6 (six) hours as needed for mild pain.   albuterol  (VENTOLIN  HFA) 108 (90 Base) MCG/ACT inhaler Inhale 2 puffs into the lungs every 4 (four) hours as needed for wheezing.   Albuterol -Budesonide  (AIRSUPRA ) 90-80 MCG/ACT AERO Inhale 1 puff into the lungs every 6 (six) hours as needed.   aspirin 81 MG tablet Take 81 mg by mouth daily.   carvedilol  (COREG ) 6.25 MG tablet  Take 1 tablet (6.25 mg total) by mouth 2 (two) times daily with a meal.   cholecalciferol (VITAMIN D3) 25 MCG (1000 UNIT) tablet Take 1,000 Units by mouth daily.   gabapentin  (NEURONTIN ) 100 MG capsule Take 1 capsule (100 mg total) by mouth as needed.   hydrochlorothiazide  (HYDRODIURIL ) 25 MG tablet TAKE 1 TABLET BY MOUTH DAILY.   hydrocortisone  2.5 % cream Apply topically 3 (three) times a week. Apply to scaly patches on face 3 nights a week as needed for flares   ibuprofen (ADVIL) 200 MG tablet Take 200 mg by mouth every 6 (six) hours as needed.   ketoconazole  (NIZORAL ) 2 % shampoo Apply 1 Application topically 3 (three) times a week. Wash face, scalp 3 days a week   Multiple Vitamins-Minerals (MULTIVITAMIN WITH MINERALS) tablet Take 1 tablet by mouth daily.   OVER THE COUNTER MEDICATION as needed. Milk Thistle   penicillin  v potassium (VEETID) 500 MG tablet Take 2 tablets (500 mg total) by mouth now. Then take 1 tablet by mouth every 6 (six) hours until gone.   telmisartan  (MICARDIS ) 80 MG tablet Take 1 tablet (80 mg total) by mouth daily.   tirzepatide  (MOUNJARO ) 7.5 MG/0.5ML Pen Inject 7.5 mg into the skin once a week.     Allergies:   Patient has no known allergies.   Social History  Tobacco Use   Smoking status: Never   Smokeless tobacco: Never  Vaping Use   Vaping status: Never Used  Substance Use Topics   Alcohol use: No   Drug use: No     Family Hx: The patient's family history includes Arthritis in his mother; Asthma in his sister; Diabetes in his brother, father, maternal grandfather, maternal grandmother, and sister; Heart disease in his sister; Heart disease (age of onset: 29) in his maternal grandmother; Hypertension in his brother, father, and mother; Kidney disease in his maternal grandfather; Lupus in his sister.  ROS:   Please see the history of present illness.     All other systems reviewed and are negative.   Labs/Other Tests and Data Reviewed:    Recent  Labs: 01/17/2023: Hemoglobin 17.8; Platelets 250 09/10/2023: ALT 75; BUN 16; Creatinine, Ser 1.19; Potassium 3.7; Sodium 137   Recent Lipid Panel Lab Results  Component Value Date/Time   CHOL 144 09/10/2023 12:27 PM   TRIG 125.0 09/10/2023 12:27 PM   HDL 50.60 09/10/2023 12:27 PM   CHOLHDL 3 09/10/2023 12:27 PM   LDLCALC 68 09/10/2023 12:27 PM   LDLDIRECT 83.0 09/10/2023 12:27 PM    Wt Readings from Last 3 Encounters:  10/09/23 279 lb 12.8 oz (126.9 kg)  09/10/23 281 lb 9.6 oz (127.7 kg)  02/19/23 284 lb 12.8 oz (129.2 kg)     Objective:    Vital Signs:  BP 118/60   Pulse 71   Ht 6\' 3"  (1.905 m)   Wt 279 lb 12.8 oz (126.9 kg)   SpO2 97%   BMI 34.97 kg/m   GEN: Well nourished, well developed in no acute distress HEENT: Normal NECK: No JVD; No carotid bruits LYMPHATICS: No lymphadenopathy CARDIAC:RRR, no murmurs, rubs, gallops RESPIRATORY:  Clear to auscultation without rales, wheezing or rhonchi  ABDOMEN: Soft, non-tender, non-distended MUSCULOSKELETAL:  No edema; No deformity  SKIN: Warm and dry NEUROLOGIC:  Alert and oriented x 3 PSYCHIATRIC:  Normal affect  ASSESSMENT & PLAN:    OSA - The patient is tolerating PAP therapy well without any problems. The PAP download performed by his DME was personally reviewed and interpreted by me today and showed an AHI of 0.3 /hr on 10 cm H2O with 90 % compliance in using more than 4 hours nightly.  The patient has been using and benefiting from PAP use and will continue to benefit from therapy.  -we ordered him a new CPAP last year but his DME was out of them at the time and he never got one -will order a new ResMed CPAP at 10cm H2O with heated humidity and mask of choice   HTN -BP controlled on exam today -Continue prescription drug management with carvedilol  6.25 mg twice daily, HCTZ 25 mg a day and Losartan  100mg  daily as needed refills   Medication Adjustments/Labs and Tests Ordered: Current medicines are reviewed at  length with the patient today.  Concerns regarding medicines are outlined above.  Tests Ordered: No orders of the defined types were placed in this encounter.   Medication Changes: No orders of the defined types were placed in this encounter.    Disposition:  Follow up in 1 year(s)  Signed, Gaylyn Keas, MD  10/09/2023 1:47 PM    [facility]

## 2023-10-14 ENCOUNTER — Telehealth: Payer: Self-pay

## 2023-10-14 DIAGNOSIS — I10 Essential (primary) hypertension: Secondary | ICD-10-CM

## 2023-10-14 DIAGNOSIS — G4733 Obstructive sleep apnea (adult) (pediatric): Secondary | ICD-10-CM

## 2023-10-14 NOTE — Telephone Encounter (Signed)
-----   Message from Armanda Magic sent at 10/09/2023  1:45 PM EST ----- order a new ResMed CPAP at 10cm H2O with heated humidity and mask of choice

## 2023-10-14 NOTE — Telephone Encounter (Signed)
New PAP and supply order sent to [facility] today.

## 2023-11-28 ENCOUNTER — Other Ambulatory Visit: Payer: Self-pay

## 2023-11-28 ENCOUNTER — Other Ambulatory Visit: Payer: Self-pay | Admitting: Internal Medicine

## 2023-11-28 MED ORDER — HYDROCHLOROTHIAZIDE 25 MG PO TABS
ORAL_TABLET | Freq: Every day | ORAL | 1 refills | Status: DC
  Filled 2023-11-28: qty 30, 30d supply, fill #0
  Filled 2024-01-01: qty 30, 30d supply, fill #1
  Filled 2024-02-04: qty 30, 30d supply, fill #2
  Filled 2024-03-09: qty 30, 30d supply, fill #3
  Filled 2024-04-06: qty 30, 30d supply, fill #4
  Filled 2024-05-08: qty 30, 30d supply, fill #5

## 2023-12-15 ENCOUNTER — Other Ambulatory Visit: Payer: Self-pay | Admitting: Internal Medicine

## 2023-12-17 ENCOUNTER — Other Ambulatory Visit: Payer: Self-pay

## 2023-12-17 ENCOUNTER — Other Ambulatory Visit: Payer: Self-pay | Admitting: Internal Medicine

## 2023-12-17 MED FILL — Tirzepatide Soln Auto-injector 7.5 MG/0.5ML: SUBCUTANEOUS | 28 days supply | Qty: 2 | Fill #0 | Status: AC

## 2024-01-01 ENCOUNTER — Other Ambulatory Visit: Payer: Self-pay

## 2024-01-01 ENCOUNTER — Other Ambulatory Visit: Payer: Self-pay | Admitting: Internal Medicine

## 2024-01-01 ENCOUNTER — Other Ambulatory Visit: Payer: Self-pay | Admitting: Dermatology

## 2024-01-01 DIAGNOSIS — L219 Seborrheic dermatitis, unspecified: Secondary | ICD-10-CM

## 2024-01-01 MED ORDER — TELMISARTAN 80 MG PO TABS
80.0000 mg | ORAL_TABLET | Freq: Every day | ORAL | 1 refills | Status: DC
  Filled 2024-01-01: qty 30, 30d supply, fill #0
  Filled 2024-02-04: qty 30, 30d supply, fill #1
  Filled 2024-03-09: qty 30, 30d supply, fill #2
  Filled 2024-04-06: qty 30, 30d supply, fill #3
  Filled 2024-05-08: qty 30, 30d supply, fill #4
  Filled 2024-06-03: qty 30, 30d supply, fill #5

## 2024-01-03 ENCOUNTER — Other Ambulatory Visit: Payer: Self-pay

## 2024-01-03 ENCOUNTER — Other Ambulatory Visit: Payer: Self-pay | Admitting: Internal Medicine

## 2024-01-03 DIAGNOSIS — L219 Seborrheic dermatitis, unspecified: Secondary | ICD-10-CM

## 2024-01-03 MED ORDER — KETOCONAZOLE 2 % EX SHAM
1.0000 | MEDICATED_SHAMPOO | CUTANEOUS | 0 refills | Status: AC
Start: 2024-01-03 — End: ?
  Filled 2024-01-03: qty 120, 31d supply, fill #0
  Filled 2024-01-17: qty 120, 30d supply, fill #0

## 2024-01-13 MED FILL — Tirzepatide Soln Auto-injector 7.5 MG/0.5ML: SUBCUTANEOUS | 28 days supply | Qty: 2 | Fill #1 | Status: CN

## 2024-01-14 ENCOUNTER — Other Ambulatory Visit: Payer: Self-pay

## 2024-01-17 ENCOUNTER — Other Ambulatory Visit: Payer: Self-pay

## 2024-01-17 MED FILL — Tirzepatide Soln Auto-injector 7.5 MG/0.5ML: SUBCUTANEOUS | 28 days supply | Qty: 2 | Fill #1 | Status: AC

## 2024-02-09 MED FILL — Tirzepatide Soln Auto-injector 7.5 MG/0.5ML: SUBCUTANEOUS | 28 days supply | Qty: 2 | Fill #2 | Status: AC

## 2024-03-09 ENCOUNTER — Other Ambulatory Visit: Payer: Self-pay

## 2024-03-09 ENCOUNTER — Other Ambulatory Visit: Payer: Self-pay | Admitting: Internal Medicine

## 2024-03-09 DIAGNOSIS — L219 Seborrheic dermatitis, unspecified: Secondary | ICD-10-CM

## 2024-03-09 MED FILL — Ketoconazole Shampoo 2%: CUTANEOUS | 30 days supply | Qty: 120 | Fill #0 | Status: CN

## 2024-03-09 MED FILL — Tirzepatide Soln Auto-injector 7.5 MG/0.5ML: SUBCUTANEOUS | 28 days supply | Qty: 2 | Fill #0 | Status: CN

## 2024-03-10 ENCOUNTER — Encounter: Payer: Self-pay | Admitting: Cardiology

## 2024-03-11 ENCOUNTER — Encounter: Payer: Self-pay | Admitting: Internal Medicine

## 2024-03-11 ENCOUNTER — Other Ambulatory Visit: Payer: Self-pay

## 2024-03-11 ENCOUNTER — Ambulatory Visit: Payer: No Typology Code available for payment source | Admitting: Internal Medicine

## 2024-03-11 VITALS — BP 120/80 | HR 69 | Ht 75.0 in | Wt 263.0 lb

## 2024-03-11 DIAGNOSIS — M791 Myalgia, unspecified site: Secondary | ICD-10-CM

## 2024-03-11 DIAGNOSIS — E785 Hyperlipidemia, unspecified: Secondary | ICD-10-CM

## 2024-03-11 DIAGNOSIS — Z0000 Encounter for general adult medical examination without abnormal findings: Secondary | ICD-10-CM | POA: Diagnosis not present

## 2024-03-11 DIAGNOSIS — Z125 Encounter for screening for malignant neoplasm of prostate: Secondary | ICD-10-CM

## 2024-03-11 DIAGNOSIS — E118 Type 2 diabetes mellitus with unspecified complications: Secondary | ICD-10-CM

## 2024-03-11 DIAGNOSIS — E66812 Obesity, class 2: Secondary | ICD-10-CM

## 2024-03-11 DIAGNOSIS — Z7985 Long-term (current) use of injectable non-insulin antidiabetic drugs: Secondary | ICD-10-CM

## 2024-03-11 DIAGNOSIS — I10 Essential (primary) hypertension: Secondary | ICD-10-CM | POA: Diagnosis not present

## 2024-03-11 DIAGNOSIS — K7581 Nonalcoholic steatohepatitis (NASH): Secondary | ICD-10-CM

## 2024-03-11 DIAGNOSIS — Z1211 Encounter for screening for malignant neoplasm of colon: Secondary | ICD-10-CM

## 2024-03-11 DIAGNOSIS — Z6837 Body mass index (BMI) 37.0-37.9, adult: Secondary | ICD-10-CM

## 2024-03-11 LAB — LIPID PANEL
Cholesterol: 137 mg/dL (ref 0–200)
HDL: 45.7 mg/dL (ref >39.00)
LDL Cholesterol: 75 mg/dL (ref 0–99)
NonHDL: 91.13
Total CHOL/HDL Ratio: 3
Triglycerides: 80 mg/dL (ref 0.0–149.0)
VLDL: 16 mg/dL (ref 0.0–40.0)

## 2024-03-11 LAB — COMPREHENSIVE METABOLIC PANEL WITH GFR
ALT: 82 U/L — ABNORMAL HIGH (ref 0–53)
AST: 34 U/L (ref 0–37)
Albumin: 4.2 g/dL (ref 3.5–5.2)
Alkaline Phosphatase: 60 U/L (ref 39–117)
BUN: 19 mg/dL (ref 6–23)
CO2: 35 meq/L — ABNORMAL HIGH (ref 19–32)
Calcium: 9.5 mg/dL (ref 8.4–10.5)
Chloride: 100 meq/L (ref 96–112)
Creatinine, Ser: 1.17 mg/dL (ref 0.40–1.50)
GFR: 73.84 mL/min (ref >60.00)
Glucose, Bld: 72 mg/dL (ref 70–99)
Potassium: 3.6 meq/L (ref 3.5–5.1)
Sodium: 139 meq/L (ref 135–145)
Total Bilirubin: 2 mg/dL — ABNORMAL HIGH (ref 0.2–1.2)
Total Protein: 7.1 g/dL (ref 6.0–8.3)

## 2024-03-11 LAB — LDL CHOLESTEROL, DIRECT: Direct LDL: 84 mg/dL

## 2024-03-11 LAB — CK: Total CK: 258 U/L — ABNORMAL HIGH (ref 7–232)

## 2024-03-11 LAB — PSA: PSA: 0.59 ng/mL (ref 0.10–4.00)

## 2024-03-11 LAB — HEMOGLOBIN A1C: Hgb A1c MFr Bld: 5.4 % (ref 4.6–6.5)

## 2024-03-11 MED ORDER — TIRZEPATIDE 10 MG/0.5ML ~~LOC~~ SOAJ
10.0000 mg | SUBCUTANEOUS | 2 refills | Status: DC
  Filled 2024-03-11: qty 2, 28d supply, fill #0
  Filled 2024-04-06: qty 2, 28d supply, fill #1
  Filled 2024-05-08 (×3): qty 2, 28d supply, fill #2
  Filled 2024-06-03: qty 2, 28d supply, fill #3
  Filled 2024-06-28: qty 2, 28d supply, fill #4
  Filled 2024-07-28: qty 2, 28d supply, fill #5
  Filled 2024-08-27: qty 2, 28d supply, fill #6
  Filled 2024-09-24: qty 2, 28d supply, fill #7

## 2024-03-11 NOTE — Assessment & Plan Note (Signed)

## 2024-03-11 NOTE — Patient Instructions (Signed)
 OK TO USE CITRUCEL OR BENEFIBER OR METAMUCIL DAILY.  OK TO USE COLACE DAILY  UPT O 200 MG DAILY   MAG CITRATE WILL ALSO HELP BOWELS MOVE    WE HAVE INCREASED YOUR MOUNJARO  TO 10 MG WEEKLY

## 2024-03-11 NOTE — Progress Notes (Signed)
 Patient ID: Adam [name], male    DOB: [birth year]  Age: 48 y.o. MRN: [id number]  The patient is here for annual preventive examination and management of other chronic and acute problems.   The risk factors are reflected in the social history.   The roster of all physicians providing medical care to patient - is listed in the Snapshot section of the chart.   Activities of daily living:  The patient is 100% independent in all ADLs: dressing, toileting, feeding as well as independent mobility   Home safety : The patient has smoke detectors in the home. They wear seatbelts.  There are no unsecured firearms at home. There is no violence in the home.    There is no risks for hepatitis, STDs or HIV. There is no   history of blood transfusion. They have no travel history to infectious disease endemic areas of the world.   The patient has seen their dentist in the last six month. They have seen their eye doctor in the last year. The patinet  denies slight hearing difficulty with regard to whispered voices and some television programs.  They have deferred audiologic testing in the last year.  They do not  have excessive sun exposure. Discussed the need for sun protection: hats, long sleeves and use of sunscreen if there is significant sun exposure.    Diet: the importance of a healthy diet is discussed. They do have a healthy diet.   The benefits of regular aerobic exercise were discussed. The patient  exercises  3 to 5 days per week  for  60 minutes.    Depression screen: there are no signs or vegative symptoms of depression- irritability, change in appetite, anhedonia, sadness/tearfullness.   The following portions of the patient's history were reviewed and updated as appropriate: allergies, current medications, past family history, past medical history,  past surgical history, past social history  and problem list.   Visual acuity was not assessed per patient preference since the patient has regular  follow up with an  ophthalmologist. Hearing and body mass index were assessed and reviewed.    During the course of the visit the patient was educated and counseled about appropriate screening and preventive services including : fall prevention , diabetes screening, nutrition counseling, colorectal cancer screening, and recommended immunizations.    Chief Complaint:   INCREASED MUSCLE PAIN after workouts.  Constipation    Review of Symptoms  Patient denies headache, fevers, malaise, unintentional weight loss, skin rash, eye pain, sinus congestion and sinus pain, sore throat, dysphagia,  hemoptysis , cough, dyspnea, wheezing, chest pain, palpitations, orthopnea, edema, abdominal pain, nausea, melena, diarrhea, constipation, flank pain, dysuria, hematuria, urinary  Frequency, nocturia, numbness, tingling, seizures,  Focal weakness, Loss of consciousness,  Tremor, insomnia, depression, anxiety, and suicidal ideation.    Physical Exam:  BP 120/80   Pulse 69   Ht 6' 3 (1.905 m)   Wt 263 lb (119.3 kg)   SpO2 99%   BMI 32.87 kg/m    Physical Exam  Assessment and Plan: Colon cancer screening -     Cologuard  Primary hypertension Assessment & Plan: Goal is 120/70.  taking  telmisartan  80 mg daily and carvedilol   Orders: -     Comprehensive metabolic panel with GFR  Controlled type 2 diabetes mellitus with complication, without long-term current use of insulin (HCC) Assessment & Plan: Diagnosed with A1c of 6.4, fasting glucose of 124.  Complicated by hypertension fatty liver, obesity and OSA.  Improved control and weight with use of  Mounjaro  after failing a trial of metformin  alone. USING  7.5 mg weekly .  Lab Results  Component Value Date   HGBA1C 5.6 09/10/2023     Orders: -     Comprehensive metabolic panel with GFR -     Hemoglobin A1c  Hyperlipidemia, unspecified hyperlipidemia type -     Lipid panel -     LDL cholesterol, direct  Prostate cancer screening -      PSA  Muscle pain -     CK  Class 2 severe obesity due to excess calories with serious comorbidity and body mass index (BMI) of 37.0 to 37.9 in adult [facility] Assessment & Plan: He has lost 30 lbs on his own  with  diet and exercise. And another 30 lbs since starting mounjaro  in 2023    Encounter for preventive health examination Assessment & Plan: age appropriate education and counseling updated, referrals for preventative services and immunizations addressed, dietary and smoking counseling addressed, most recent labs reviewed.  I have personally reviewed and have noted:   1) the patient's medical and social history 2) The pt's use of alcohol, tobacco, and illicit drugs 3) The patient's current medications and supplements 4) Functional ability including ADL's, fall risk, home safety risk, hearing and visual impairment 5) Diet and physical activities 6) Evidence for depression or mood disorder 7) The patient's height, weight, and BMI have been recorded in the chart  I have made referrals, and provided counseling and education based on review of the above    Other orders -     Tirzepatide ; Inject 10 mg into the skin once a week.  Dispense: 6 mL; Refill: 2    Return in about 6 months (around 09/10/2024) for follow up diabetes.  Thersia Flax, MD

## 2024-03-11 NOTE — Assessment & Plan Note (Addendum)
 He has lost 30 lbs on his own  with  diet and exercise. And another 30 lbs since starting mounjaro  in 2023

## 2024-03-11 NOTE — Assessment & Plan Note (Addendum)
 Goal is 120/70.  taking  telmisartan  80 mg daily and carvedilol 

## 2024-03-11 NOTE — Assessment & Plan Note (Addendum)
 Diagnosed with A1c of 6.4, fasting glucose of 124.  Complicated by hypertension fatty liver, obesity and OSA.   Improved control and weight with use of  Mounjaro  after failing a trial of metformin  alone. USING  7.5 mg weekly .  Lab Results  Component Value Date   HGBA1C 5.6 09/10/2023

## 2024-03-15 ENCOUNTER — Ambulatory Visit: Payer: Self-pay | Admitting: Internal Medicine

## 2024-03-15 NOTE — Addendum Note (Signed)
 Addended by: MARYLYNN VERNEITA CROME on: 03/15/2024 10:23 PM   Modules accepted: Orders

## 2024-03-30 ENCOUNTER — Ambulatory Visit
Admission: RE | Admit: 2024-03-30 | Discharge: 2024-03-30 | Disposition: A | Discharge status: Home / Self Care | Source: Ambulatory Visit | Attending: Internal Medicine | Admitting: Internal Medicine

## 2024-03-30 DIAGNOSIS — K7581 Nonalcoholic steatohepatitis (NASH): Secondary | ICD-10-CM | POA: Insufficient documentation

## 2024-05-08 ENCOUNTER — Other Ambulatory Visit: Payer: Self-pay

## 2024-05-08 ENCOUNTER — Other Ambulatory Visit (HOSPITAL_BASED_OUTPATIENT_CLINIC_OR_DEPARTMENT_OTHER): Payer: Self-pay

## 2024-05-11 ENCOUNTER — Other Ambulatory Visit: Payer: Self-pay

## 2024-05-18 ENCOUNTER — Other Ambulatory Visit (HOSPITAL_BASED_OUTPATIENT_CLINIC_OR_DEPARTMENT_OTHER): Payer: Self-pay

## 2024-05-27 ENCOUNTER — Telehealth: Payer: Self-pay | Admitting: Cardiology

## 2024-05-27 NOTE — Telephone Encounter (Signed)
 Pt is trying to get an update on cpap machine that was ordered in June but he never received. Please advise.

## 2024-06-01 NOTE — Telephone Encounter (Signed)
 Reached out to patient and updated him on the status of his cpap machine. [facility] is going to process his order stat because he has been waiting so long. Patient was grateful for the call.

## 2024-06-03 ENCOUNTER — Other Ambulatory Visit: Payer: Self-pay | Admitting: Internal Medicine

## 2024-06-04 ENCOUNTER — Other Ambulatory Visit: Payer: Self-pay

## 2024-06-04 MED ORDER — HYDROCHLOROTHIAZIDE 25 MG PO TABS
25.0000 mg | ORAL_TABLET | Freq: Every day | ORAL | 1 refills | Status: DC
  Filled 2024-06-04: qty 30, 30d supply, fill #0
  Filled 2024-06-29: qty 30, 30d supply, fill #1
  Filled 2024-07-23: qty 30, 30d supply, fill #2
  Filled 2024-09-14: qty 30, 30d supply, fill #3
  Filled 2024-10-13: qty 30, 30d supply, fill #4

## 2024-07-08 ENCOUNTER — Other Ambulatory Visit: Payer: Self-pay | Admitting: Internal Medicine

## 2024-07-09 ENCOUNTER — Other Ambulatory Visit: Payer: Self-pay

## 2024-07-09 MED ORDER — TELMISARTAN 80 MG PO TABS
80.0000 mg | ORAL_TABLET | Freq: Every day | ORAL | 1 refills | Status: AC
  Filled 2024-07-09: qty 30, 30d supply, fill #0
  Filled 2024-08-12: qty 30, 30d supply, fill #1
  Filled 2024-09-14: qty 30, 30d supply, fill #2
  Filled 2024-10-13: qty 30, 30d supply, fill #3

## 2024-07-23 ENCOUNTER — Other Ambulatory Visit: Payer: Self-pay | Admitting: Internal Medicine

## 2024-07-23 ENCOUNTER — Other Ambulatory Visit: Payer: Self-pay

## 2024-07-23 MED ORDER — AMOXICILLIN 500 MG PO CAPS
500.0000 mg | ORAL_CAPSULE | Freq: Three times a day (TID) | ORAL | 0 refills | Status: DC
  Filled 2024-07-23: qty 21, 7d supply, fill #0

## 2024-07-23 MED ORDER — HYDROCODONE-ACETAMINOPHEN 5-325 MG PO TABS
1.0000 | ORAL_TABLET | ORAL | 0 refills | Status: DC | PRN
  Filled 2024-07-23: qty 10, 2d supply, fill #0

## 2024-07-24 ENCOUNTER — Other Ambulatory Visit: Payer: Self-pay

## 2024-07-27 ENCOUNTER — Other Ambulatory Visit: Payer: Self-pay

## 2024-07-28 ENCOUNTER — Ambulatory Visit: Payer: Self-pay | Admitting: Cardiology

## 2024-07-28 ENCOUNTER — Other Ambulatory Visit: Payer: Self-pay

## 2024-07-28 ENCOUNTER — Encounter: Payer: Self-pay | Admitting: Internal Medicine

## 2024-07-28 MED ORDER — CARVEDILOL 6.25 MG PO TABS
6.2500 mg | ORAL_TABLET | Freq: Two times a day (BID) | ORAL | 1 refills | Status: AC
  Filled 2024-09-14 – 2024-09-24 (×2): qty 60, 30d supply, fill #0
  Filled 2024-10-13: qty 60, 30d supply, fill #1

## 2024-07-28 MED FILL — Carvedilol Tab 6.25 MG: ORAL | 30 days supply | Qty: 60 | Fill #0 | Status: AC

## 2024-07-29 ENCOUNTER — Other Ambulatory Visit: Payer: Self-pay

## 2024-07-30 ENCOUNTER — Other Ambulatory Visit: Payer: Self-pay

## 2024-07-30 ENCOUNTER — Other Ambulatory Visit: Payer: Self-pay | Admitting: Internal Medicine

## 2024-07-30 DIAGNOSIS — L219 Seborrheic dermatitis, unspecified: Secondary | ICD-10-CM

## 2024-07-31 ENCOUNTER — Other Ambulatory Visit: Payer: Self-pay

## 2024-08-01 ENCOUNTER — Other Ambulatory Visit: Payer: Self-pay

## 2024-08-01 MED FILL — Ketoconazole Shampoo 2%: CUTANEOUS | 28 days supply | Qty: 120 | Fill #0 | Status: AC

## 2024-08-03 ENCOUNTER — Other Ambulatory Visit: Payer: Self-pay

## 2024-08-27 MED FILL — Carvedilol Tab 6.25 MG: ORAL | 30 days supply | Qty: 60 | Fill #1 | Status: AC

## 2024-09-10 ENCOUNTER — Ambulatory Visit: Admitting: Internal Medicine

## 2024-09-15 ENCOUNTER — Other Ambulatory Visit: Payer: Self-pay

## 2024-09-24 ENCOUNTER — Other Ambulatory Visit: Payer: Self-pay | Admitting: Internal Medicine

## 2024-09-24 DIAGNOSIS — L219 Seborrheic dermatitis, unspecified: Secondary | ICD-10-CM

## 2024-09-25 ENCOUNTER — Other Ambulatory Visit: Payer: Self-pay

## 2024-09-29 ENCOUNTER — Other Ambulatory Visit: Payer: Self-pay

## 2024-09-30 ENCOUNTER — Other Ambulatory Visit: Payer: Self-pay

## 2024-09-30 MED ORDER — KETOCONAZOLE 2 % EX SHAM
1.0000 | MEDICATED_SHAMPOO | CUTANEOUS | 0 refills | Status: AC
  Filled 2024-09-30: qty 120, 30d supply, fill #0

## 2024-10-14 ENCOUNTER — Other Ambulatory Visit: Payer: Self-pay

## 2024-10-20 ENCOUNTER — Ambulatory Visit: Admitting: Internal Medicine

## 2024-10-20 ENCOUNTER — Encounter: Payer: Self-pay | Admitting: Internal Medicine

## 2024-10-20 ENCOUNTER — Other Ambulatory Visit: Payer: Self-pay

## 2024-10-20 VITALS — BP 118/64 | HR 95 | Temp 97.9°F | Resp 12 | Ht 75.0 in | Wt 245.0 lb

## 2024-10-20 DIAGNOSIS — K760 Fatty (change of) liver, not elsewhere classified: Secondary | ICD-10-CM | POA: Diagnosis not present

## 2024-10-20 DIAGNOSIS — G4733 Obstructive sleep apnea (adult) (pediatric): Secondary | ICD-10-CM | POA: Diagnosis not present

## 2024-10-20 DIAGNOSIS — E785 Hyperlipidemia, unspecified: Secondary | ICD-10-CM | POA: Diagnosis not present

## 2024-10-20 DIAGNOSIS — E118 Type 2 diabetes mellitus with unspecified complications: Secondary | ICD-10-CM | POA: Diagnosis not present

## 2024-10-20 DIAGNOSIS — R000 Tachycardia, unspecified: Secondary | ICD-10-CM | POA: Diagnosis not present

## 2024-10-20 DIAGNOSIS — E559 Vitamin D deficiency, unspecified: Secondary | ICD-10-CM | POA: Diagnosis not present

## 2024-10-20 DIAGNOSIS — Z87898 Personal history of other specified conditions: Secondary | ICD-10-CM | POA: Diagnosis not present

## 2024-10-20 DIAGNOSIS — Z7985 Long-term (current) use of injectable non-insulin antidiabetic drugs: Secondary | ICD-10-CM

## 2024-10-20 DIAGNOSIS — K7581 Nonalcoholic steatohepatitis (NASH): Secondary | ICD-10-CM

## 2024-10-20 MED ORDER — ALBUTEROL SULFATE HFA 108 (90 BASE) MCG/ACT IN AERS
2.0000 | INHALATION_SPRAY | RESPIRATORY_TRACT | 2 refills | Status: AC | PRN
  Filled 2024-10-20: qty 6.7, 16d supply, fill #0

## 2024-10-20 MED ORDER — TIRZEPATIDE 10 MG/0.5ML ~~LOC~~ SOAJ
10.0000 mg | SUBCUTANEOUS | 2 refills | Status: AC
  Filled 2024-10-20: qty 2, 28d supply, fill #0

## 2024-10-20 NOTE — Assessment & Plan Note (Signed)
 He is an alpha 1 antitrypsin mutation carrier,  based on previously reported labs  and GI evaluation by Dr Jinny.  No biopsies were  done. He was referred to a nutritionist,  but thus far only GLP 1 agonists have been successful in achieving and maintaining weight loss. He has had a 55 lb weight loss since Feb 2024 Repeat GI referral pending

## 2024-10-20 NOTE — Assessment & Plan Note (Addendum)
 Noted on prior ultrasound  in 2024, persistent by July 2025 U/s.  referral to Rogelia Copping in 2024 was made but he was never seen for unclear reasons.

## 2024-10-20 NOTE — Assessment & Plan Note (Signed)
 Managed by Wilbert Bihari,  last evaluation in  Nov 2025; no changes to settings

## 2024-10-20 NOTE — Progress Notes (Signed)
 "  Subjective:  Patient ID: Adam [name], male    DOB: May 25, 1976  Age: 48 y.o. MRN: [id number]  CC: The primary encounter diagnosis was Hepatic steatosis. Diagnoses of Controlled diabetes mellitus type 2 with complications (HCC), OSA on CPAP, Hyperlipidemia LDL goal <70, History of syncope, Tachycardia, Metabolic dysfunction-associated steatohepatitis (MASH), Vitamin D  deficiency, and Long-term current use of injectable noninsulin antidiabetic medication were also pertinent to this visit.   HPI Adam [name] presents for  follow up on type 2 DM   Type 2 DM:  He feels generally well, is exercising several times per week and checking blood sugars once daily at variable times.  BS have been under 130 fasting and < 150 post prandially.  Denies any recent hypoglyemic events.  Taking his medications as directed. Following a carbohydrate modified diet 6 days per week. Denies numbness, burning and tingling of extremities. Appetite is diminished using Mounjaro  10 mg weekly currently     Obesity:  he has had a 55 lb weight loss since Feb 2024 using Mounaro.  He is exercising regularly using calisthenics and running.  When weather permits.  The running is outside and suspendd during cold or inclement weather   HTN:  Hypertension: patient is following a reduced salt diet most days and reports that he  is taking medications as prescribed : telmisartan  80 mg daily  Carvedilol  6.25 mg bid, and hydrochlorothiazide  25 mg daily. However for the past several months he has began experiencing positional dizziness,  occurring with sudden changes from reclining to standing.  He also reports  a syncopal episode that occurred 6 weeks ago after having a bowel movement .  The syncope was preceded by tremulousness and diaphoresis    Outpatient Medications Prior to Visit  Medication Sig Dispense Refill   acetaminophen  (TYLENOL ) 500 MG tablet Take 1,000 mg by mouth every 6 (six) hours as needed for mild pain.     aspirin 81  MG tablet Take 81 mg by mouth daily.     carvedilol  (COREG ) 6.25 MG tablet Take 1 tablet (6.25 mg total) by mouth 2 (two) times daily with a meal. 60 tablet 1   cholecalciferol (VITAMIN D3) 25 MCG (1000 UNIT) tablet Take 1,000 Units by mouth daily.     ketoconazole  (NIZORAL ) 2 % shampoo Wash face and scalp 3 (three) times a week. 120 mL 0   Magnesium 400 MG CAPS Take 1 capsule by mouth daily at 6 (six) AM.     Multiple Vitamins-Minerals (MULTIVITAMIN WITH MINERALS) tablet Take 1 tablet by mouth daily.     OVER THE COUNTER MEDICATION as needed. Milk Thistle     telmisartan  (MICARDIS ) 80 MG tablet Take 1 tablet (80 mg total) by mouth daily. 90 tablet 1   albuterol  (VENTOLIN  HFA) 108 (90 Base) MCG/ACT inhaler Inhale 2 puffs into the lungs every 4 (four) hours as needed for wheezing. 6.7 g 2   Albuterol -Budesonide  (AIRSUPRA ) 90-80 MCG/ACT AERO Inhale 1 [name] into the lungs every 6 (six) hours as needed. 10.7 g 11   carvedilol  (COREG ) 6.25 MG tablet Take 1 tablet (6.25 mg total) by mouth 2 (two) times daily with a meal. 60 tablet 1   tirzepatide  (MOUNJARO ) 10 MG/0.5ML Pen Inject 10 mg into the skin once a week. 6 mL 2   hydrochlorothiazide  (HYDRODIURIL ) 25 MG tablet Take 1 tablet (25 mg total) by mouth daily. (Patient not taking: Reported on 10/20/2024) 90 tablet 1   amoxicillin  (AMOXIL ) 500 MG capsule Take 1 capsule (  500 mg total) by mouth 3 (three) times daily. 21 capsule 0   HYDROcodone -acetaminophen  (NORCO/VICODIN) 5-325 MG tablet Take 1 tablet by mouth every 4 (four) to 6 (six) hours as needed for pain. 10 tablet 0   No facility-administered medications prior to visit.    Review of Systems;  Patient denies headache, fevers, malaise, unintentional weight loss, skin rash, eye pain, sinus congestion and sinus pain, sore throat, dysphagia,  hemoptysis , cough, dyspnea, wheezing, chest pain, palpitations, orthopnea, edema, abdominal pain, nausea, melena, diarrhea, constipation, flank pain, dysuria,  hematuria, urinary  Frequency, nocturia, numbness, tingling, seizures,  Focal weakness, Loss of consciousness,  Tremor, insomnia, depression, anxiety, and suicidal ideation.      Objective:  BP 118/64 (BP Location: Left Arm)   Pulse 95   Temp 97.9 F (36.6 C)   Resp 12   Ht 6' 3 (1.905 m)   Wt 245 lb (111.1 kg)   SpO2 93%   BMI 30.62 kg/m   BP Readings from Last 3 Encounters:  10/20/24 118/64  03/11/24 120/80  10/09/23 118/60    Wt Readings from Last 3 Encounters:  10/20/24 245 lb (111.1 kg)  03/11/24 263 lb (119.3 kg)  10/09/23 279 lb 12.8 oz (126.9 kg)    Physical Exam Vitals reviewed.  Constitutional:      General: He is not in acute distress.    Appearance: Normal appearance. He is normal weight. He is not ill-appearing, toxic-appearing or diaphoretic.  HENT:     Head: Normocephalic.  Eyes:     General: No scleral icterus.       Right eye: No discharge.        Left eye: No discharge.     Conjunctiva/sclera: Conjunctivae normal.  Cardiovascular:     Rate and Rhythm: Normal rate and regular rhythm.     Heart sounds: Normal heart sounds.  Pulmonary:     Effort: Pulmonary effort is normal. No respiratory distress.     Breath sounds: Normal breath sounds.  Musculoskeletal:        General: Normal range of motion.     Cervical back: Normal range of motion.  Skin:    General: Skin is warm and dry.  Neurological:     General: No focal deficit present.     Mental Status: He is alert and oriented to person, place, and time. Mental status is at baseline.  Psychiatric:        Mood and Affect: Mood normal.        Behavior: Behavior normal.        Thought Content: Thought content normal.        Judgment: Judgment normal.     Lab Results  Component Value Date   HGBA1C 5.4 03/11/2024   HGBA1C 5.6 09/10/2023   HGBA1C 5.7 12/31/2022    Lab Results  Component Value Date   CREATININE 1.17 03/11/2024   CREATININE 1.19 09/10/2023   CREATININE 1.21 12/31/2022     Lab Results  Component Value Date   WBC 5.6 01/17/2023   HGB 17.8 (H) 01/17/2023   HCT 53.9 (H) 01/17/2023   PLT 250 01/17/2023   GLUCOSE 72 03/11/2024   CHOL 137 03/11/2024   TRIG 80.0 03/11/2024   HDL 45.70 03/11/2024   LDLDIRECT 84.0 03/11/2024   LDLCALC 75 03/11/2024   ALT 82 (H) 03/11/2024   AST 34 03/11/2024   NA 139 03/11/2024   K 3.6 03/11/2024   CL 100 03/11/2024   CREATININE 1.17 03/11/2024  BUN 19 03/11/2024   CO2 35 (H) 03/11/2024   TSH 0.95 10/01/2022   PSA 0.59 03/11/2024   HGBA1C 5.4 03/11/2024    US  Abdomen Limited RUQ (LIVER/GB) Result Date: 03/30/2024 CLINICAL DATA:  Elevated liver enzymes.  MASH. EXAM: ULTRASOUND ABDOMEN LIMITED RIGHT UPPER QUADRANT COMPARISON:  01/14/2023 FINDINGS: Gallbladder: Gallstones: None Sludge: None Gallbladder Wall: Within normal limits Pericholecystic fluid: None Sonographic Murphy's Sign: Negative per technologist Common bile duct: Diameter: 5 mm Liver: Parenchymal echogenicity: Diffusely increased Contours: Normal Lesions: None Portal vein: Patent.  Hepatopetal flow Other: Incidentally noted simple RIGHT renal cysts do not require dedicated imaging surveillance. IMPRESSION: Diffuse increased echogenicity of the hepatic parenchyma is a nonspecific indicator of hepatocellular dysfunction, most commonly steatosis. Electronically Signed   By: Aliene Lloyd M.D.   On: 03/30/2024 09:39    Assessment & Plan:  .Hepatic steatosis Assessment & Plan: Noted on prior ultrasound  in 2024, persistent by July 2025 U/s.  referral to Rogelia Copping in 2024 was made but he was never seen for unclear reasons.   Orders: -     CBC with Differential/Platelet  Controlled diabetes mellitus type 2 with complications (HCC) Assessment & Plan: Diagnosed with A1c of 6.4, fasting glucose of 124.  Complicated by hypertension fatty liver, obesity , CAD (likely) and OSA.   Improved control and weight with use of  Mounjaro  after failing a trial of metformin   alone. USING  10 mg weekly with 55 lb weight loss achieved.  Repeat labs today.     Lab Results  Component Value Date   HGBA1C 5.4 03/11/2024     Orders: -     Hemoglobin A1c -     Microalbumin / creatinine urine ratio -     Comprehensive metabolic panel with GFR -     LDL cholesterol, direct -     Lipid panel  OSA on CPAP Assessment & Plan: Managed by Wilbert Bihari,  last evaluation in  Nov 2025; no changes to settings    Hyperlipidemia LDL goal <70 Assessment & Plan: Rechecking today    History of syncope Assessment & Plan: Occurring 1 month ago, with new onset positional presyncope without demonstrated orthostasis in office.  I have ordered and reviewed a 12 lead EKG and find that there are new Q waves in the anteroseptal leads  .  His last myoview in 2017 noted Small, mild fixed mid-anterior wall artifact (SDS 0). No reversible ischemia. LVEF 55% with normal wall motion. Good exercise capacity, however, marked hypertensive response to exercise. ; will contact his cardiologist to arrange follow up  Orders: -     EKG 12-Lead  Tachycardia Assessment & Plan: Unclear etiology given reported adherence to use of carvedilol .  I have ordered and reviewed a 12 lead EKG and find that there are no acute changes and patient is in sinus rhythm.     Orders: -     EKG 12-Lead  Metabolic dysfunction-associated steatohepatitis (MASH) Assessment & Plan: He is an alpha 1 antitrypsin mutation carrier,  based on previously reported labs  and GI evaluation by Dr Copping.  No biopsies were  done. He was referred to a nutritionist,  but thus far only GLP 1 agonists have been successful in achieving and maintaining weight loss. He has had a 55 lb weight loss since Feb 2024 Repeat GI referral pending   Orders: -     Ambulatory referral to Gastroenterology  Vitamin D  deficiency -     VITAMIN D  25 Hydroxy (  Vit-D Deficiency, Fractures)  Long-term current use of injectable noninsulin antidiabetic  medication Assessment & Plan: Using Mounjaro     Other orders -     Albuterol  Sulfate HFA; Inhale 2 puffs into the lungs every 4 (four) hours as needed for wheezing.  Dispense: 6.7 g; Refill: 2 -     Tirzepatide ; Inject 10 mg into the skin once a week.  Dispense: 6 mL; Refill: 2     I spent 34 minutes on the day of this face to face encounter reviewing patient's  most recent visit with cardiology,  nephrology,  and neurology,  prior relevant surgical and non surgical procedures, recent  labs and imaging studies, counseling on weight management,  reviewing the assessment and plan with patient, and post visit ordering and reviewing of  diagnostics and therapeutics with patient  .   Follow-up: No follow-ups on file.   Verneita LITTIE Kettering, MD  "

## 2024-10-20 NOTE — Assessment & Plan Note (Signed)
 Rechecking today

## 2024-10-20 NOTE — Patient Instructions (Addendum)
 Ask Adam [name] if any of the 3 medications for blood pressure  you are taking have been missed or stopped :   (I want you to stop the hydrochlorthiazide if you are taking it. )  hydrochlorothiazide ,  telmisartan  and  carvedilol   Ask her (also ) which GI doctor she has been calling (what phone #); we may need to start over

## 2024-10-20 NOTE — Assessment & Plan Note (Signed)
Using [facility]

## 2024-10-20 NOTE — Assessment & Plan Note (Addendum)
 Occurring 1 month ago, with new onset positional presyncope without demonstrated orthostasis in office.  I have ordered and reviewed a 12 lead EKG and find that there are new Q waves in the anteroseptal leads  .  His last myoview in 2017 noted Small, mild fixed mid-anterior wall artifact (SDS 0). No reversible ischemia. LVEF 55% with normal wall motion. Good exercise capacity, however, marked hypertensive response to exercise. ; will contact his cardiologist to arrange follow up

## 2024-10-20 NOTE — Assessment & Plan Note (Signed)
 Unclear etiology given reported adherence to use of carvedilol .  I have ordered and reviewed a 12 lead EKG and find that there are no acute changes and patient is in sinus rhythm.

## 2024-10-20 NOTE — Assessment & Plan Note (Signed)
 Diagnosed with A1c of 6.4, fasting glucose of 124.  Complicated by hypertension fatty liver, obesity , CAD (likely) and OSA.   Improved control and weight with use of  Mounjaro  after failing a trial of metformin  alone. USING  10 mg weekly with 55 lb weight loss achieved.  Repeat labs today.     Lab Results  Component Value Date   HGBA1C 5.4 03/11/2024

## 2024-10-21 ENCOUNTER — Encounter: Payer: Self-pay | Admitting: Internal Medicine

## 2024-10-21 LAB — LIPID PANEL
Cholesterol: 145 mg/dL (ref 28–200)
HDL: 48.6 mg/dL
LDL Cholesterol: 85 mg/dL (ref 10–99)
NonHDL: 96.45
Total CHOL/HDL Ratio: 3
Triglycerides: 59 mg/dL (ref 10.0–149.0)
VLDL: 11.8 mg/dL (ref 0.0–40.0)

## 2024-10-21 LAB — COMPREHENSIVE METABOLIC PANEL WITH GFR
ALT: 79 U/L — ABNORMAL HIGH (ref 3–53)
AST: 35 U/L (ref 5–37)
Albumin: 4.3 g/dL (ref 3.5–5.2)
Alkaline Phosphatase: 66 U/L (ref 39–117)
BUN: 15 mg/dL (ref 6–23)
CO2: 35 meq/L — ABNORMAL HIGH (ref 19–32)
Calcium: 10.1 mg/dL (ref 8.4–10.5)
Chloride: 100 meq/L (ref 96–112)
Creatinine, Ser: 1.18 mg/dL (ref 0.40–1.50)
GFR: 72.78 mL/min
Glucose, Bld: 78 mg/dL (ref 70–99)
Potassium: 3.6 meq/L (ref 3.5–5.1)
Sodium: 140 meq/L (ref 135–145)
Total Bilirubin: 1.4 mg/dL — ABNORMAL HIGH (ref 0.2–1.2)
Total Protein: 7.5 g/dL (ref 6.0–8.3)

## 2024-10-21 LAB — CBC WITH DIFFERENTIAL/PLATELET
Basophils Absolute: 0 10*3/uL (ref 0.0–0.1)
Basophils Relative: 0.4 % (ref 0.0–3.0)
Eosinophils Absolute: 0.2 10*3/uL (ref 0.0–0.7)
Eosinophils Relative: 2.6 % (ref 0.0–5.0)
HCT: 51.8 % (ref 39.0–52.0)
Hemoglobin: 17.5 g/dL — ABNORMAL HIGH (ref 13.0–17.0)
Lymphocytes Relative: 34.3 % (ref 12.0–46.0)
Lymphs Abs: 2.6 10*3/uL (ref 0.7–4.0)
MCHC: 33.8 g/dL (ref 30.0–36.0)
MCV: 89.8 fl (ref 78.0–100.0)
Monocytes Absolute: 0.5 10*3/uL (ref 0.1–1.0)
Monocytes Relative: 7.3 % (ref 3.0–12.0)
Neutro Abs: 4.1 10*3/uL (ref 1.4–7.7)
Neutrophils Relative %: 55.4 % (ref 43.0–77.0)
Platelets: 232 10*3/uL (ref 150.0–400.0)
RBC: 5.77 Mil/uL (ref 4.22–5.81)
RDW: 13.1 % (ref 11.5–15.5)
WBC: 7.5 10*3/uL (ref 4.0–10.5)

## 2024-10-21 LAB — LDL CHOLESTEROL, DIRECT: Direct LDL: 87 mg/dL

## 2024-10-21 LAB — MICROALBUMIN / CREATININE URINE RATIO
Creatinine,U: 321.7 mg/dL
Microalb Creat Ratio: 9.1 mg/g (ref 0.0–30.0)
Microalb, Ur: 2.9 mg/dL — ABNORMAL HIGH (ref 0.7–1.9)

## 2024-10-21 LAB — VITAMIN D 25 HYDROXY (VIT D DEFICIENCY, FRACTURES): VITD: 81.13 ng/mL (ref 30.00–100.00)

## 2024-10-21 LAB — HEMOGLOBIN A1C: Hgb A1c MFr Bld: 5.2 % (ref 4.6–6.5)

## 2024-10-21 NOTE — Addendum Note (Signed)
 Addended by: MARYLYNN VERNEITA CROME on: 10/21/2024 12:32 PM   Modules accepted: Orders

## 2024-10-22 ENCOUNTER — Ambulatory Visit: Payer: Self-pay | Admitting: Internal Medicine

## 2024-10-22 DIAGNOSIS — K7581 Nonalcoholic steatohepatitis (NASH): Secondary | ICD-10-CM

## 2024-10-22 NOTE — Assessment & Plan Note (Signed)
"   Fibrosis 4 Score = .81 (Low risk)         Interpretation for patients with HCV          <1.45       -  F0-F1 (Low risk)          1.45-3.25 -  Indeterminate           >3.25      -  F3-F4 (High risk)     Validated for ages 62-65   "

## 2024-10-23 ENCOUNTER — Ambulatory Visit: Attending: Internal Medicine | Admitting: Internal Medicine

## 2024-10-23 ENCOUNTER — Encounter: Payer: Self-pay | Admitting: Internal Medicine

## 2024-10-23 VITALS — BP 122/77 | HR 63 | Resp 17 | Ht 75.0 in | Wt 253.0 lb

## 2024-10-23 DIAGNOSIS — R55 Syncope and collapse: Secondary | ICD-10-CM

## 2024-10-23 NOTE — Progress Notes (Signed)
 "   Cardiology Office Note   Date:  10/23/2024   ID:  Adam [name], DOB 24-Nov-1975, MRN [id number]  PCP:  Marylynn Verneita CROME, MD  Cardiologist:   Vina Gull, MD   Pt referred for dizziness, abnormal EKG      History of Present Illness: Adam [name] is a 49 y.o. male with a history of HTN, dizziness,syncope, palpitations, HL  hepatic steatosis, T2DM, OSA (on CPAP),  Previously followed by Adam [name]   Last seen in Jan 2025    2017  Echo Mild LVH   LVEF 60 to 65% 2017  Myoview  He is followed in IM by T Tullo  Seen on 10/20/24.   At that visit hydrochlorothiazide  was discontinued due to dizziness    EKG done  QUestion Q waves in V1, V2     He denies CP  Breathing is good He has had some dizziness with standing quickly   About 6 months ago had syncopal event after BM  None since   Note in past year he has lost about 100lbs   Feels great overall   Diet: CHanged diet a lot  Breakfast   Skips   Or grerek yogurt parfait Lunch  Skip Dinner:  Sweet potato / chicken/ meat   [place]   Active Medications[1]   Allergies:   Patient has no known allergies.   Past Medical History:  Diagnosis Date   Asthma    Childhood   Chicken pox    Chronic headaches    Diabetes mellitus without complication (HCC)    Hypertension 2013   Neuromuscular disorder (HCC)    OSA (obstructive sleep apnea)    moderate with AHI 15.5/hr 2014 home sleep study   Puncture wound of foot 09/10/2023    Past Surgical History:  Procedure Laterality Date   CARPAL TUNNEL RELEASE Left 01/23/2023   Procedure: CARPAL TUNNEL RELEASE ENDOSCOPIC;  Surgeon: Edie Norleen PARAS, MD;  Location: [facility] ORS;  Service: Orthopedics;  Laterality: Left;   COLONOSCOPY N/A 02/19/2023   Procedure: COLONOSCOPY;  Surgeon: Therisa Bi, MD;  Location: [facility] ENDOSCOPY;  Service: Gastroenterology;  Laterality: N/A;   hip dysplagia Right      Family History:  The patient's family history includes Arthritis in his mother; Asthma in his sister;  Diabetes in his brother, father, maternal grandfather, maternal grandmother, and sister; Heart disease in his sister; Heart disease (age of onset: 46) in his maternal grandmother; Hypertension in his brother, father, and mother; Kidney disease in his maternal grandfather; Lupus in his sister.    ROS:  Please see the history of present illness. All other systems are reviewed and  Negative to the above problem except as noted.    PHYSICAL EXAM: VS:  BP 122/77 (BP Location: Left Arm, Patient Position: Sitting, Cuff Size: Large)   Pulse 63   Resp 17   Ht 6' 3 (1.905 m)   Wt 253 lb (114.8 kg)   SpO2 97%   BMI 31.62 kg/m   GEN: Obese 49 yo  in no acute distress  HEENT: normal  Neck: no JVD, carotid bruits, Cardiac: RRR; no murmurs Respiratory:  clear to auscultation GI: soft, nontender, No hepatomegaly  Ext No LE edema   EKG:  EKG is ordered today.  NSR 73 bpm  First degree AV block  PR 214 msec    2017  Myoview  The left ventricular ejection fraction is normal (55-65%). Nuclear stress EF: 55%. Blood pressure demonstrated a hypertensive response  to exercise. No T wave inversion was noted during stress. There was no ST segment deviation noted during stress. Defect 1: There is a small defect of mild severity. This is a low risk study.   Small, mild fixed mid-anterior wall artifact (SDS 0). No reversible ischemia. LVEF 55% with normal wall motion. Good exercise capacity, however, marked hypertensive response to exercise. This is a low risk study.  Lipid Panel    Component Value Date/Time   CHOL 145 10/20/2024 1424   TRIG 59.0 10/20/2024 1424   HDL 48.60 10/20/2024 1424   CHOLHDL 3 10/20/2024 1424   VLDL 11.8 10/20/2024 1424   LDLCALC 85 10/20/2024 1424   LDLDIRECT 87.0 10/20/2024 1424      Wt Readings from Last 3 Encounters:  10/23/24 253 lb (114.8 kg)  10/20/24 245 lb (111.1 kg)  03/11/24 263 lb (119.3 kg)      ASSESSMENT AND PLAN:  1  Dizziness  Spells sound  orthostatic   May be that he was on too much BP meds now that his weight is down    hydrochlorothiazide  has just been stopped   Follow symptoms   Follow BP  2 Syncope   Episode after defecation.  Again, may be related to too many meds plus vagal reflex   I do nto think arrhythmia Follow  Stay hydrated   Watch BP    3  HTN   Recent change with drop of hydrochlorothiazide    May not need as much with weight loss  FOllow BP at home     4   EKG  EKG today Small R wave in V2   Mention of septal Qs on outside EKG.  I think this relates to lead placement   Not pathologic Does have first degree AV block  ON b blocker    Very active  I do not think a problem   5  Metabolics     Pt with MASH  REviewed COngratulated on wt loss  Keep working on Diet  Given sites ([employer], [employer]) for education    6 LIpids   Good   Follow up in 1 year    Current medicines are reviewed at length with the patient today.  The patient does not have concerns regarding medicines.  Signed, Vina Gull, MD        [1]  Current Meds  Medication Sig   acetaminophen  (TYLENOL ) 500 MG tablet Take 1,000 mg by mouth every 6 (six) hours as needed for mild pain.   albuterol  (VENTOLIN  HFA) 108 (90 Base) MCG/ACT inhaler Inhale 2 puffs into the lungs every 4 (four) hours as needed for wheezing.   aspirin 81 MG tablet Take 81 mg by mouth daily.   carvedilol  (COREG ) 6.25 MG tablet Take 1 tablet (6.25 mg total) by mouth 2 (two) times daily with a meal.   cholecalciferol (VITAMIN D3) 25 MCG (1000 UNIT) tablet Take 1,000 Units by mouth daily.   hydrochlorothiazide  (HYDRODIURIL ) 25 MG tablet Take 1 tablet (25 mg total) by mouth daily.   ketoconazole  (NIZORAL ) 2 % shampoo Wash face and scalp 3 (three) times a week.   Magnesium 400 MG CAPS Take 1 capsule by mouth daily at 6 (six) AM.   Multiple Vitamins-Minerals (MULTIVITAMIN WITH MINERALS) tablet Take 1 tablet by mouth daily.   OVER THE COUNTER MEDICATION as needed. Milk  Thistle   telmisartan  (MICARDIS ) 80 MG tablet Take 1 tablet (80 mg total) by mouth daily.   tirzepatide  (MOUNJARO ) 10  MG/0.5ML Pen Inject 10 mg into the skin once a week.   "

## 2024-10-23 NOTE — Patient Instructions (Signed)
 Medication Instructions:  No changes  *If you need a refill on your cardiac medications before your next appointment, please call your pharmacy*   Lab Work: Not needed    Testing/Procedures:  Not needed  Follow-Up: At [facility], you and your health needs are our priority.  As part of our continuing mission to provide you with exceptional heart care, we have created designated Provider Care Teams.  These Care Teams include your primary Cardiologist (physician) and Advanced Practice Providers (APPs -  Physician Assistants and Nurse Practitioners) who all work together to provide you with the care you need, when you need it.     Your next appointment:   12 month(s)  The format for your next appointment:   In Person  Provider:   Vina Gull, MD
